# Patient Record
Sex: Female | Born: 1970
Health system: Southern US, Community
[De-identification: ages and names within clinical notes are randomized; demographics above are authoritative.]

## PROBLEM LIST (undated history)

## (undated) DIAGNOSIS — R0602 Shortness of breath: Secondary | ICD-10-CM

## (undated) DIAGNOSIS — R51 Headache: Secondary | ICD-10-CM

## (undated) DIAGNOSIS — R519 Headache, unspecified: Secondary | ICD-10-CM

## (undated) DIAGNOSIS — Z9989 Dependence on other enabling machines and devices: Secondary | ICD-10-CM

## (undated) DIAGNOSIS — E669 Obesity, unspecified: Secondary | ICD-10-CM

## (undated) DIAGNOSIS — G8929 Other chronic pain: Secondary | ICD-10-CM

## (undated) DIAGNOSIS — E039 Hypothyroidism, unspecified: Secondary | ICD-10-CM

## (undated) DIAGNOSIS — R7302 Impaired glucose tolerance (oral): Secondary | ICD-10-CM

## (undated) DIAGNOSIS — I10 Essential (primary) hypertension: Secondary | ICD-10-CM

## (undated) DIAGNOSIS — U071 COVID-19: Secondary | ICD-10-CM

## (undated) DIAGNOSIS — M545 Low back pain, unspecified: Secondary | ICD-10-CM

## (undated) DIAGNOSIS — D352 Benign neoplasm of pituitary gland: Secondary | ICD-10-CM

## (undated) DIAGNOSIS — E785 Hyperlipidemia, unspecified: Secondary | ICD-10-CM

## (undated) DIAGNOSIS — K219 Gastro-esophageal reflux disease without esophagitis: Secondary | ICD-10-CM

## (undated) DIAGNOSIS — T4145XA Adverse effect of unspecified anesthetic, initial encounter: Secondary | ICD-10-CM

## (undated) DIAGNOSIS — G4733 Obstructive sleep apnea (adult) (pediatric): Secondary | ICD-10-CM

## (undated) DIAGNOSIS — T8859XA Other complications of anesthesia, initial encounter: Secondary | ICD-10-CM

## (undated) HISTORY — DX: Hyperlipidemia, unspecified: E78.5

## (undated) HISTORY — DX: Impaired glucose tolerance (oral): R73.02

## (undated) HISTORY — DX: Benign neoplasm of pituitary gland: D35.2

## (undated) HISTORY — DX: Gastro-esophageal reflux disease without esophagitis: K21.9

## (undated) HISTORY — DX: Obesity, unspecified: E66.9

## (undated) HISTORY — PX: CHOLECYSTECTOMY: SHX55

## (undated) HISTORY — DX: Essential (primary) hypertension: I10

## (undated) HISTORY — PX: BACK SURGERY: SHX140

## (undated) HISTORY — DX: Hypothyroidism, unspecified: E03.9

---

## 1998-03-18 ENCOUNTER — Other Ambulatory Visit: Admission: RE | Admit: 1998-03-18 | Discharge: 1998-03-18 | Payer: Self-pay | Admitting: Obstetrics & Gynecology

## 1998-03-25 ENCOUNTER — Other Ambulatory Visit: Admission: RE | Admit: 1998-03-25 | Discharge: 1998-03-25 | Payer: Self-pay | Admitting: Obstetrics & Gynecology

## 1998-03-30 ENCOUNTER — Inpatient Hospital Stay (HOSPITAL_COMMUNITY): Admission: AD | Admit: 1998-03-30 | Discharge: 1998-03-30 | Payer: Self-pay | Admitting: Obstetrics and Gynecology

## 1998-05-17 ENCOUNTER — Other Ambulatory Visit: Admission: RE | Admit: 1998-05-17 | Discharge: 1998-05-17 | Payer: Self-pay | Admitting: Obstetrics & Gynecology

## 1999-12-12 HISTORY — PX: REDUCTION MAMMAPLASTY: SUR839

## 2000-03-20 ENCOUNTER — Other Ambulatory Visit: Admission: RE | Admit: 2000-03-20 | Discharge: 2000-03-20 | Payer: Self-pay | Admitting: Obstetrics

## 2000-04-02 ENCOUNTER — Ambulatory Visit (HOSPITAL_BASED_OUTPATIENT_CLINIC_OR_DEPARTMENT_OTHER): Admission: RE | Admit: 2000-04-02 | Discharge: 2000-04-02 | Payer: Self-pay | Admitting: Specialist

## 2000-04-02 ENCOUNTER — Encounter (INDEPENDENT_AMBULATORY_CARE_PROVIDER_SITE_OTHER): Payer: Self-pay | Admitting: *Deleted

## 2000-08-30 ENCOUNTER — Inpatient Hospital Stay (HOSPITAL_COMMUNITY): Admission: AD | Admit: 2000-08-30 | Discharge: 2000-08-30 | Payer: Self-pay | Admitting: Obstetrics & Gynecology

## 2000-09-14 ENCOUNTER — Other Ambulatory Visit: Admission: RE | Admit: 2000-09-14 | Discharge: 2000-09-14 | Payer: Self-pay | Admitting: Obstetrics and Gynecology

## 2000-10-03 ENCOUNTER — Inpatient Hospital Stay (HOSPITAL_COMMUNITY): Admission: AD | Admit: 2000-10-03 | Discharge: 2000-10-03 | Payer: Self-pay | Admitting: Obstetrics and Gynecology

## 2000-10-11 ENCOUNTER — Encounter: Payer: Self-pay | Admitting: Obstetrics and Gynecology

## 2000-10-11 ENCOUNTER — Ambulatory Visit (HOSPITAL_COMMUNITY): Admission: RE | Admit: 2000-10-11 | Discharge: 2000-10-11 | Payer: Self-pay | Admitting: Obstetrics and Gynecology

## 2000-10-12 ENCOUNTER — Inpatient Hospital Stay (HOSPITAL_COMMUNITY): Admission: AD | Admit: 2000-10-12 | Discharge: 2000-10-12 | Payer: Self-pay | Admitting: *Deleted

## 2000-10-23 ENCOUNTER — Inpatient Hospital Stay (HOSPITAL_COMMUNITY): Admission: AD | Admit: 2000-10-23 | Discharge: 2000-10-23 | Payer: Self-pay | Admitting: *Deleted

## 2000-11-05 ENCOUNTER — Inpatient Hospital Stay (HOSPITAL_COMMUNITY): Admission: AD | Admit: 2000-11-05 | Discharge: 2000-11-05 | Payer: Self-pay | Admitting: Obstetrics & Gynecology

## 2001-01-03 ENCOUNTER — Other Ambulatory Visit: Admission: RE | Admit: 2001-01-03 | Discharge: 2001-01-03 | Payer: Self-pay | Admitting: Obstetrics

## 2002-01-08 ENCOUNTER — Encounter: Payer: Self-pay | Admitting: Obstetrics and Gynecology

## 2002-01-08 ENCOUNTER — Ambulatory Visit (HOSPITAL_COMMUNITY): Admission: RE | Admit: 2002-01-08 | Discharge: 2002-01-08 | Payer: Self-pay | Admitting: Obstetrics and Gynecology

## 2003-10-30 ENCOUNTER — Other Ambulatory Visit: Admission: RE | Admit: 2003-10-30 | Discharge: 2003-10-30 | Payer: Self-pay | Admitting: Obstetrics and Gynecology

## 2004-03-08 ENCOUNTER — Ambulatory Visit (HOSPITAL_COMMUNITY): Admission: RE | Admit: 2004-03-08 | Discharge: 2004-03-08 | Payer: Self-pay | Admitting: Obstetrics and Gynecology

## 2006-12-26 ENCOUNTER — Encounter: Payer: Self-pay | Admitting: Endocrinology

## 2007-02-04 ENCOUNTER — Other Ambulatory Visit: Admission: RE | Admit: 2007-02-04 | Discharge: 2007-02-04 | Payer: Self-pay | Admitting: Gynecology

## 2007-02-26 ENCOUNTER — Ambulatory Visit (HOSPITAL_COMMUNITY): Admission: RE | Admit: 2007-02-26 | Discharge: 2007-02-26 | Payer: Self-pay | Admitting: Gynecology

## 2007-03-29 ENCOUNTER — Encounter: Admission: RE | Admit: 2007-03-29 | Discharge: 2007-03-29 | Payer: Self-pay | Admitting: Gynecology

## 2007-05-15 ENCOUNTER — Encounter: Payer: Self-pay | Admitting: Endocrinology

## 2007-07-08 ENCOUNTER — Emergency Department (HOSPITAL_COMMUNITY): Admission: EM | Admit: 2007-07-08 | Discharge: 2007-07-08 | Payer: Self-pay | Admitting: Emergency Medicine

## 2007-07-08 ENCOUNTER — Encounter: Payer: Self-pay | Admitting: Endocrinology

## 2007-07-15 ENCOUNTER — Encounter: Admission: RE | Admit: 2007-07-15 | Discharge: 2007-10-13 | Payer: Self-pay | Admitting: Family Medicine

## 2007-07-24 ENCOUNTER — Other Ambulatory Visit: Admission: RE | Admit: 2007-07-24 | Discharge: 2007-07-24 | Payer: Self-pay | Admitting: Gynecology

## 2007-10-31 ENCOUNTER — Encounter: Payer: Self-pay | Admitting: Gynecology

## 2007-10-31 ENCOUNTER — Encounter: Payer: Self-pay | Admitting: Endocrinology

## 2008-03-26 ENCOUNTER — Encounter: Payer: Self-pay | Admitting: Endocrinology

## 2008-04-16 ENCOUNTER — Other Ambulatory Visit: Admission: RE | Admit: 2008-04-16 | Discharge: 2008-04-16 | Payer: Self-pay | Admitting: Gynecology

## 2008-08-21 ENCOUNTER — Encounter: Payer: Self-pay | Admitting: Endocrinology

## 2008-08-21 ENCOUNTER — Encounter: Payer: Self-pay | Admitting: Pulmonary Disease

## 2008-08-26 ENCOUNTER — Encounter: Payer: Self-pay | Admitting: Endocrinology

## 2008-09-01 ENCOUNTER — Inpatient Hospital Stay (HOSPITAL_COMMUNITY): Admission: EM | Admit: 2008-09-01 | Discharge: 2008-09-05 | Payer: Self-pay | Admitting: Emergency Medicine

## 2008-09-04 ENCOUNTER — Encounter: Payer: Self-pay | Admitting: Internal Medicine

## 2008-09-09 ENCOUNTER — Encounter: Payer: Self-pay | Admitting: Endocrinology

## 2008-09-09 ENCOUNTER — Encounter: Payer: Self-pay | Admitting: Pulmonary Disease

## 2008-09-22 DIAGNOSIS — E669 Obesity, unspecified: Secondary | ICD-10-CM | POA: Insufficient documentation

## 2008-09-22 DIAGNOSIS — D352 Benign neoplasm of pituitary gland: Secondary | ICD-10-CM | POA: Insufficient documentation

## 2008-09-22 DIAGNOSIS — D353 Benign neoplasm of craniopharyngeal duct: Secondary | ICD-10-CM

## 2008-09-23 ENCOUNTER — Ambulatory Visit: Payer: Self-pay | Admitting: Pulmonary Disease

## 2008-09-23 DIAGNOSIS — R0602 Shortness of breath: Secondary | ICD-10-CM | POA: Insufficient documentation

## 2008-12-19 ENCOUNTER — Encounter: Admission: RE | Admit: 2008-12-19 | Discharge: 2008-12-19 | Payer: Self-pay | Admitting: Neurosurgery

## 2008-12-22 ENCOUNTER — Encounter: Admission: RE | Admit: 2008-12-22 | Discharge: 2008-12-22 | Payer: Self-pay | Admitting: Endocrinology

## 2008-12-24 ENCOUNTER — Encounter: Admission: RE | Admit: 2008-12-24 | Discharge: 2008-12-24 | Payer: Self-pay | Admitting: Neurosurgery

## 2008-12-28 ENCOUNTER — Ambulatory Visit: Payer: Self-pay | Admitting: Internal Medicine

## 2008-12-28 DIAGNOSIS — E039 Hypothyroidism, unspecified: Secondary | ICD-10-CM | POA: Insufficient documentation

## 2008-12-28 DIAGNOSIS — I10 Essential (primary) hypertension: Secondary | ICD-10-CM | POA: Insufficient documentation

## 2008-12-28 DIAGNOSIS — E739 Lactose intolerance, unspecified: Secondary | ICD-10-CM | POA: Insufficient documentation

## 2008-12-28 DIAGNOSIS — J019 Acute sinusitis, unspecified: Secondary | ICD-10-CM | POA: Insufficient documentation

## 2009-09-21 IMAGING — US US SOFT TISSUE HEAD/NECK
1 series · 13 of 25 positions shown · non-contrast
Comparison: None

Addendum Begins

The outside report from [HOSPITAL] dated 08/18/2008 is
now available for comparison.
The 4 mm upper pole right nodule is described similarly on the
prior exam.
The interpolar right sided solid nodule is reported at 1.0 x 0.6 x
0.9 cm on the prior exam.  This suggests stability.
Findings therefore suggests stability over approximately 3 months.
Ongoing ultrasound surveillance suggested.
Addendum Ends
CLINICAL DATA: Follow up of goiter.
THYROID ULTRASOUND
TECHNIQUE: Ultrasound examination of the thyroid gland and
adjacent soft tissues was performed.

[Series 1: us soft tissue head/neck · 0.08mm/px · 13 of 45 slices shown]
[im 1/45]
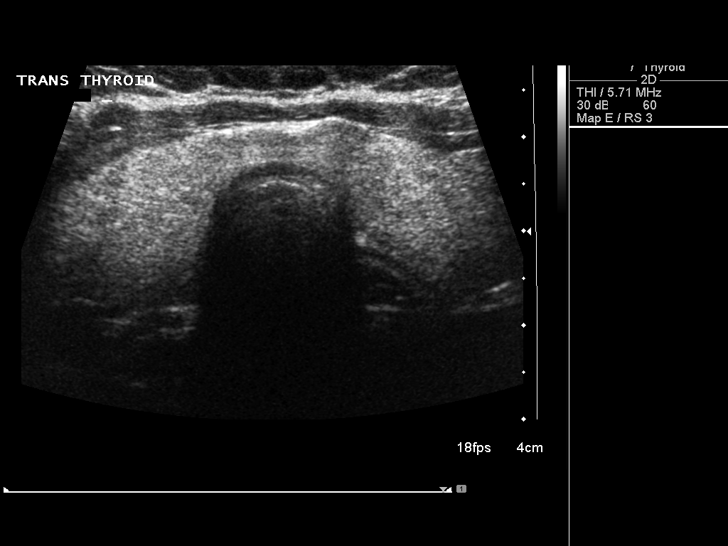
[im 4/45]
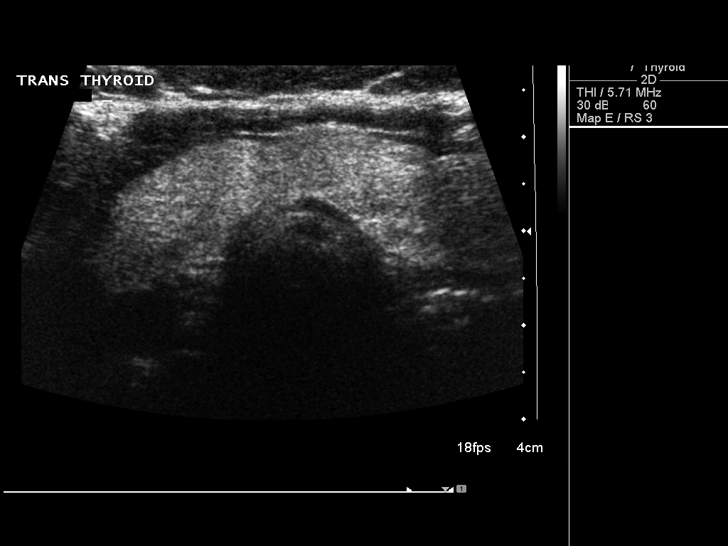
[im 8/45]
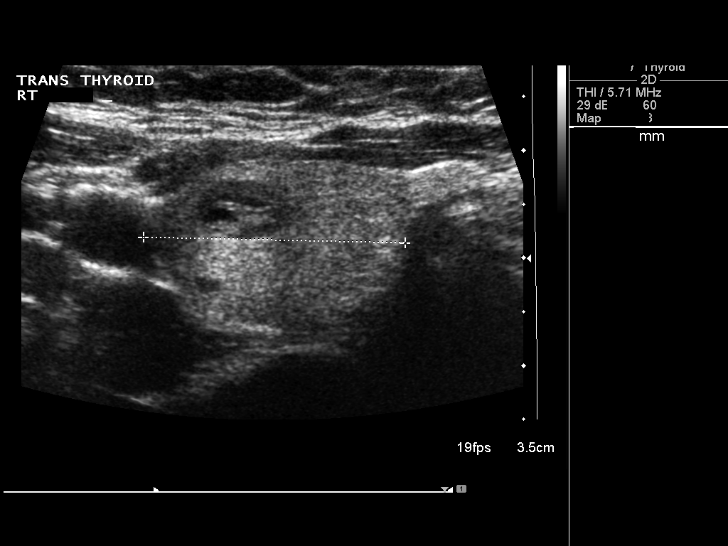
[im 12/45]
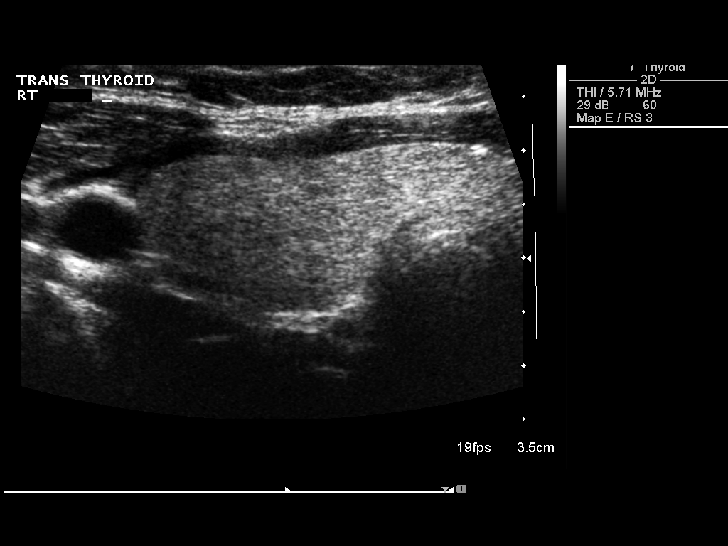
[im 15/45]
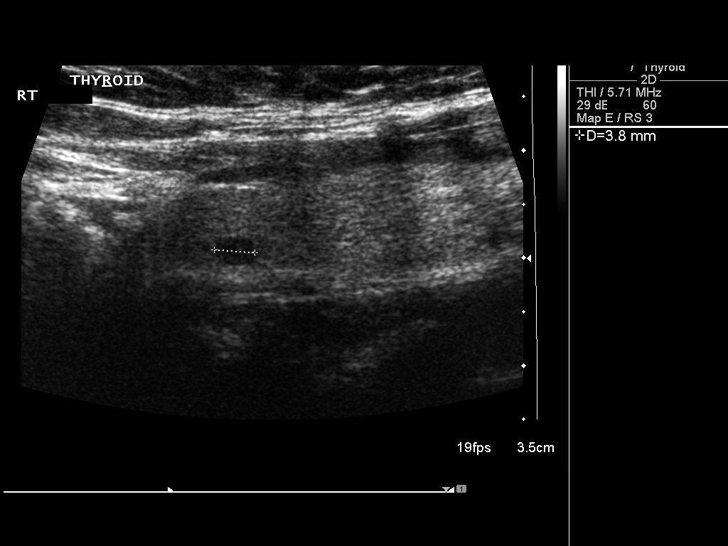
[im 19/45]
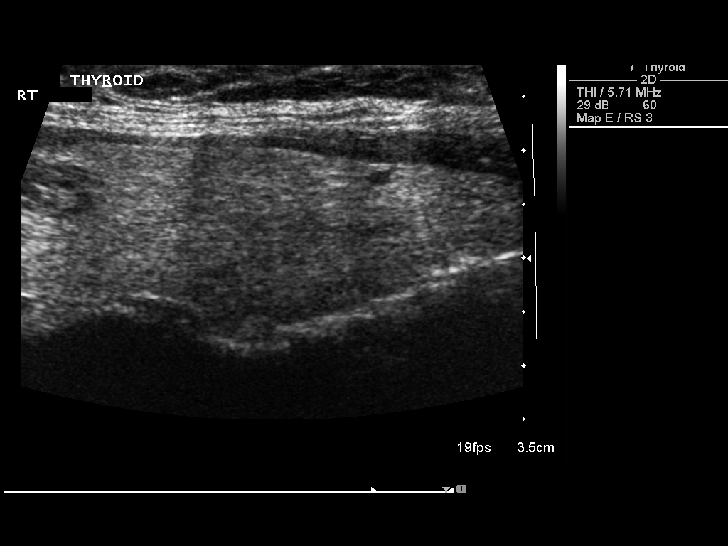
[im 23/45]
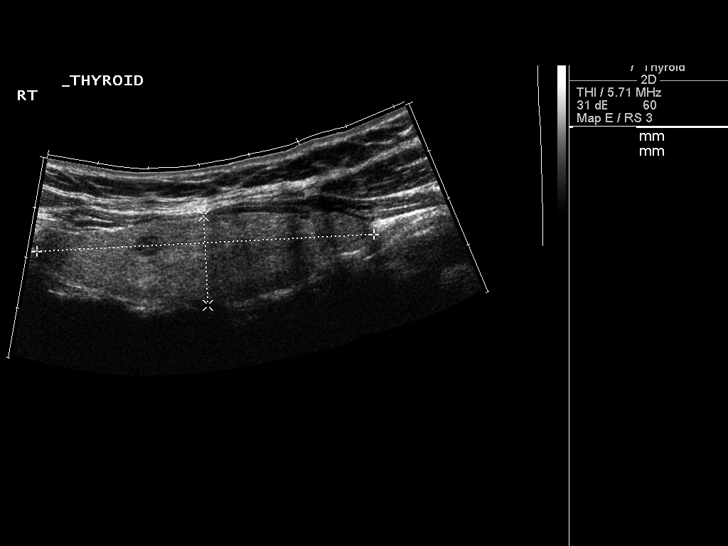
[im 26/45]
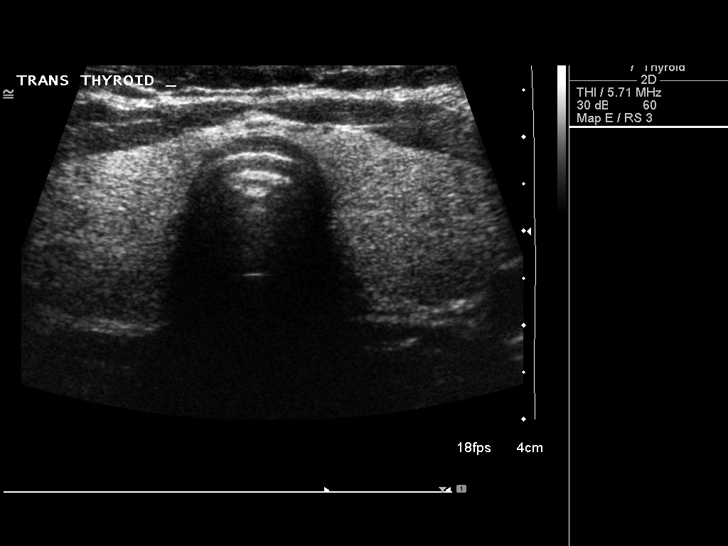
[im 30/45]
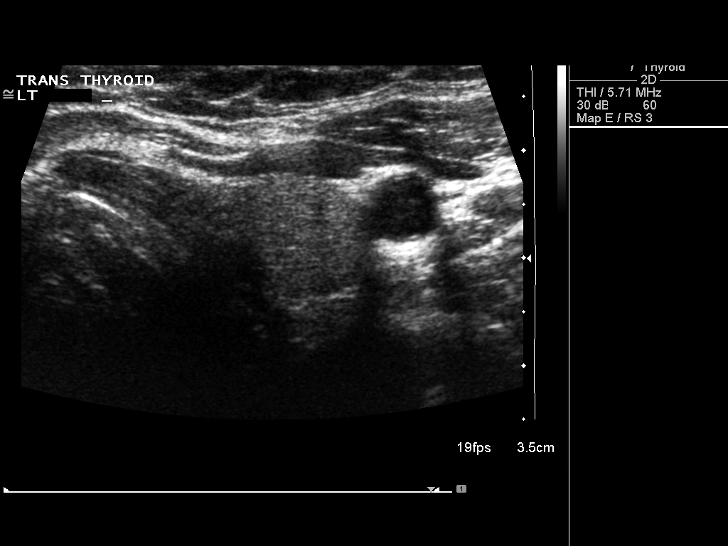
[im 34/45]
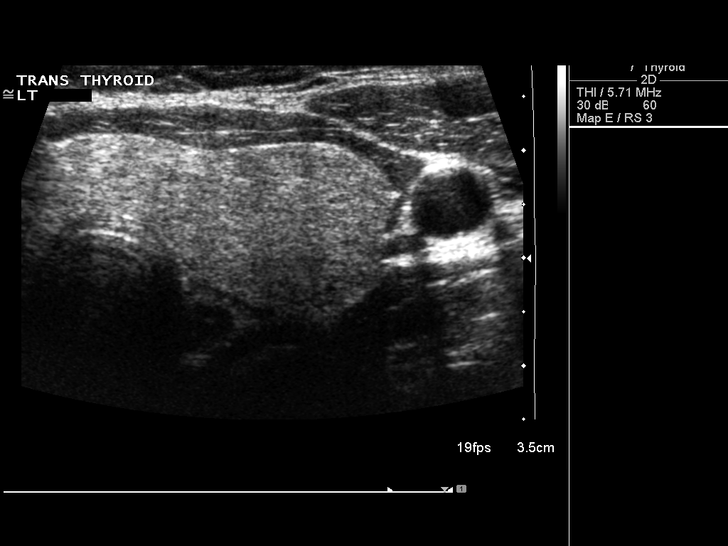
[im 37/45]
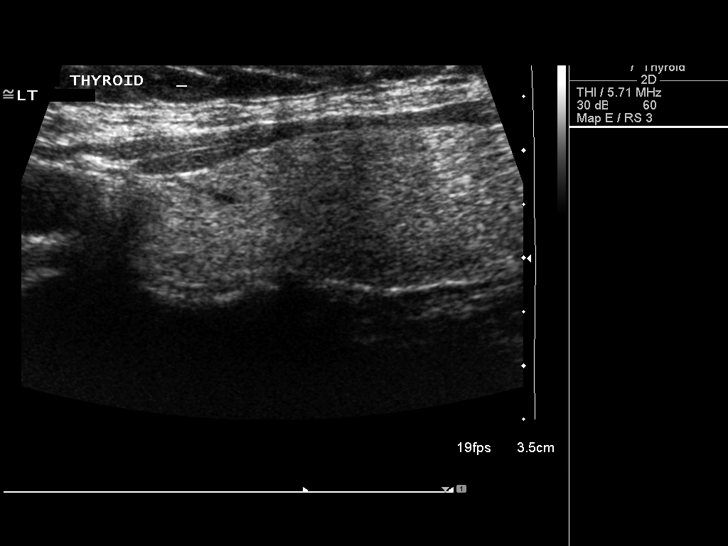
[im 41/45]
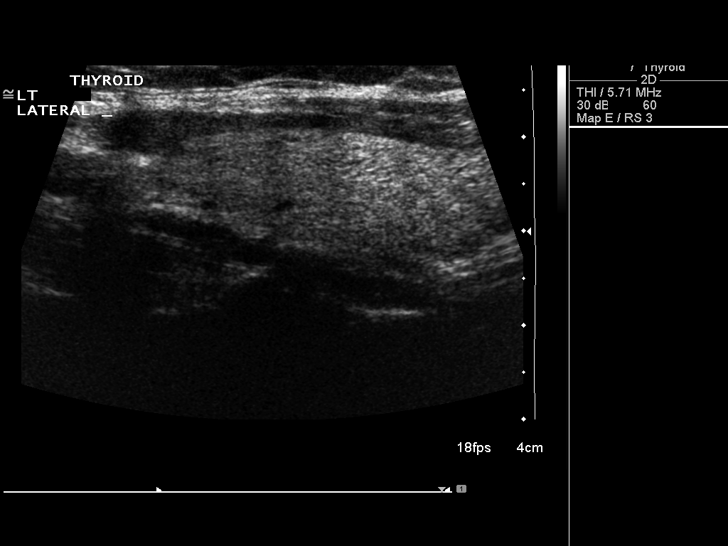
[im 45/45]
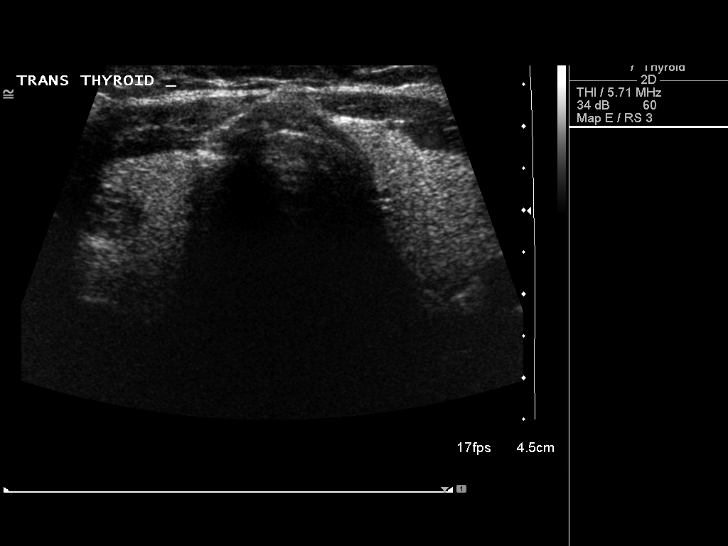

[13 of 25 positions shown; findings below may reference images not displayed]

FINDINGS: The right lobe measures 6.6 x 1.8 x 2.4 cm.  The left
lobe measures 5.6 x 1.4 x 2.2 cm.  The isthmus measures 6 mm .

Mildly heterogeneous thyroid echotexture.

Within the upper pole of the right lobe 4 mm hypoechoic solid
nodule.

Within the interpolar region of the right lobe, a 9 x 6 x 9 mm
hypoechoic solid lesion.

Within the medial lower pole of the right lobe, 2 mm hypoechoic,
likely solid lesion.

No left-sided lesions.
IMPRESSION: Right-sided thyroid lesions without highly suspicious dominant
lesion.  A 9 mm interpolar right sided lesion can be reevaluated at
follow-up.

## 2009-10-01 ENCOUNTER — Telehealth: Payer: Self-pay | Admitting: Internal Medicine

## 2009-10-01 ENCOUNTER — Ambulatory Visit: Payer: Self-pay | Admitting: Internal Medicine

## 2009-10-01 LAB — CONVERTED CEMR LAB
ALT: 26 units/L (ref 0–35)
AST: 19 units/L (ref 0–37)
Albumin: 3.4 g/dL — ABNORMAL LOW (ref 3.5–5.2)
Alkaline Phosphatase: 104 units/L (ref 39–117)
BUN: 10 mg/dL (ref 6–23)
Basophils Absolute: 0 10*3/uL (ref 0.0–0.1)
Basophils Relative: 0.5 % (ref 0.0–3.0)
Bilirubin Urine: NEGATIVE
Bilirubin, Direct: 0.1 mg/dL (ref 0.0–0.3)
CO2: 33 meq/L — ABNORMAL HIGH (ref 19–32)
Calcium: 8.9 mg/dL (ref 8.4–10.5)
Chloride: 100 meq/L (ref 96–112)
Cholesterol: 232 mg/dL — ABNORMAL HIGH (ref 0–200)
Creatinine, Ser: 0.7 mg/dL (ref 0.4–1.2)
Direct LDL: 176.6 mg/dL
Eosinophils Absolute: 0.2 10*3/uL (ref 0.0–0.7)
Eosinophils Relative: 2.1 % (ref 0.0–5.0)
GFR calc non Af Amer: 121.09 mL/min (ref >60)
Glucose, Bld: 87 mg/dL (ref 70–99)
HCT: 36.6 % (ref 36.0–46.0)
HDL: 52.1 mg/dL (ref >39.00)
Hemoglobin: 12.5 g/dL (ref 12.0–15.0)
Hgb A1c MFr Bld: 5.5 % (ref 4.6–6.5)
Ketones, ur: NEGATIVE mg/dL
Lymphocytes Relative: 29 % (ref 12.0–46.0)
Lymphs Abs: 2.6 10*3/uL (ref 0.7–4.0)
MCHC: 34.1 g/dL (ref 30.0–36.0)
MCV: 83.8 fL (ref 78.0–100.0)
Monocytes Absolute: 0.4 10*3/uL (ref 0.1–1.0)
Monocytes Relative: 4.8 % (ref 3.0–12.0)
Neutro Abs: 5.7 10*3/uL (ref 1.4–7.7)
Neutrophils Relative %: 63.6 % (ref 43.0–77.0)
Nitrite: NEGATIVE
Platelets: 314 10*3/uL (ref 150.0–400.0)
Potassium: 3.9 meq/L (ref 3.5–5.1)
RBC: 4.37 M/uL (ref 3.87–5.11)
RDW: 13.6 % (ref 11.5–14.6)
Sodium: 140 meq/L (ref 135–145)
Specific Gravity, Urine: 1.025 (ref 1.000–1.030)
TSH: 1.75 microintl units/mL (ref 0.35–5.50)
Total Bilirubin: 0.7 mg/dL (ref 0.3–1.2)
Total CHOL/HDL Ratio: 4
Total Protein, Urine: 30 mg/dL
Total Protein: 7.4 g/dL (ref 6.0–8.3)
Triglycerides: 67 mg/dL (ref 0.0–149.0)
Urine Glucose: NEGATIVE mg/dL
Urobilinogen, UA: 0.2 (ref 0.0–1.0)
VLDL: 13.4 mg/dL (ref 0.0–40.0)
WBC: 8.9 10*3/uL (ref 4.5–10.5)
pH: 6 (ref 5.0–8.0)

## 2009-10-04 LAB — CONVERTED CEMR LAB: Prolactin: 3.7 ng/mL

## 2009-10-08 ENCOUNTER — Ambulatory Visit: Payer: Self-pay | Admitting: Internal Medicine

## 2009-10-21 ENCOUNTER — Telehealth: Payer: Self-pay | Admitting: Internal Medicine

## 2010-02-15 ENCOUNTER — Ambulatory Visit: Payer: Self-pay | Admitting: Internal Medicine

## 2010-02-15 DIAGNOSIS — J069 Acute upper respiratory infection, unspecified: Secondary | ICD-10-CM | POA: Insufficient documentation

## 2010-02-15 DIAGNOSIS — R21 Rash and other nonspecific skin eruption: Secondary | ICD-10-CM | POA: Insufficient documentation

## 2010-02-23 ENCOUNTER — Encounter: Admission: RE | Admit: 2010-02-23 | Discharge: 2010-02-23 | Payer: Self-pay | Admitting: Neurosurgery

## 2010-02-27 DIAGNOSIS — F32A Depression, unspecified: Secondary | ICD-10-CM | POA: Insufficient documentation

## 2010-02-27 DIAGNOSIS — J309 Allergic rhinitis, unspecified: Secondary | ICD-10-CM | POA: Insufficient documentation

## 2010-02-27 DIAGNOSIS — F329 Major depressive disorder, single episode, unspecified: Secondary | ICD-10-CM

## 2010-02-28 ENCOUNTER — Encounter: Payer: Self-pay | Admitting: Internal Medicine

## 2010-04-08 ENCOUNTER — Encounter: Payer: Self-pay | Admitting: Internal Medicine

## 2010-04-12 ENCOUNTER — Encounter: Payer: Self-pay | Admitting: Internal Medicine

## 2010-04-12 ENCOUNTER — Telehealth: Payer: Self-pay | Admitting: Internal Medicine

## 2010-07-11 HISTORY — PX: DILATION AND CURETTAGE OF UTERUS: SHX78

## 2010-07-11 LAB — CONVERTED CEMR LAB: Pap Smear: NORMAL

## 2010-08-31 ENCOUNTER — Ambulatory Visit: Payer: Self-pay | Admitting: Internal Medicine

## 2010-08-31 DIAGNOSIS — H918X9 Other specified hearing loss, unspecified ear: Secondary | ICD-10-CM | POA: Insufficient documentation

## 2010-08-31 LAB — CONVERTED CEMR LAB
ALT: 27 units/L (ref 0–35)
AST: 26 units/L (ref 0–37)
Albumin: 3.5 g/dL (ref 3.5–5.2)
Alkaline Phosphatase: 125 units/L — ABNORMAL HIGH (ref 39–117)
BUN: 11 mg/dL (ref 6–23)
Basophils Absolute: 0 10*3/uL (ref 0.0–0.1)
Basophils Relative: 0.4 % (ref 0.0–3.0)
Bilirubin, Direct: 0.1 mg/dL (ref 0.0–0.3)
CO2: 31 meq/L (ref 19–32)
Calcium: 9.3 mg/dL (ref 8.4–10.5)
Chloride: 99 meq/L (ref 96–112)
Cholesterol: 225 mg/dL — ABNORMAL HIGH (ref 0–200)
Creatinine, Ser: 0.5 mg/dL (ref 0.4–1.2)
Direct LDL: 164.6 mg/dL
Eosinophils Absolute: 0.4 10*3/uL (ref 0.0–0.7)
Eosinophils Relative: 5.3 % — ABNORMAL HIGH (ref 0.0–5.0)
GFR calc non Af Amer: 169.8 mL/min (ref >60)
Glucose, Bld: 80 mg/dL (ref 70–99)
HCT: 36.1 % (ref 36.0–46.0)
HDL: 47.3 mg/dL (ref >39.00)
Hemoglobin: 12.1 g/dL (ref 12.0–15.0)
Lymphocytes Relative: 24.5 % (ref 12.0–46.0)
Lymphs Abs: 1.8 10*3/uL (ref 0.7–4.0)
MCHC: 33.5 g/dL (ref 30.0–36.0)
MCV: 83 fL (ref 78.0–100.0)
Monocytes Absolute: 0.6 10*3/uL (ref 0.1–1.0)
Monocytes Relative: 8.3 % (ref 3.0–12.0)
Neutro Abs: 4.6 10*3/uL (ref 1.4–7.7)
Neutrophils Relative %: 61.5 % (ref 43.0–77.0)
Platelets: 313 10*3/uL (ref 150.0–400.0)
Potassium: 3.5 meq/L (ref 3.5–5.1)
RBC: 4.34 M/uL (ref 3.87–5.11)
RDW: 13.9 % (ref 11.5–14.6)
Sodium: 139 meq/L (ref 135–145)
TSH: 1.36 microintl units/mL (ref 0.35–5.50)
Total Bilirubin: 0.3 mg/dL (ref 0.3–1.2)
Total CHOL/HDL Ratio: 5
Total Protein: 7.1 g/dL (ref 6.0–8.3)
Triglycerides: 131 mg/dL (ref 0.0–149.0)
VLDL: 26.2 mg/dL (ref 0.0–40.0)
WBC: 7.5 10*3/uL (ref 4.5–10.5)

## 2010-12-23 ENCOUNTER — Ambulatory Visit
Admission: RE | Admit: 2010-12-23 | Discharge: 2010-12-23 | Payer: Self-pay | Source: Home / Self Care | Attending: Endocrinology | Admitting: Endocrinology

## 2011-01-10 NOTE — Letter (Signed)
Summary: Out of Work  [facility]  [address] [place], [place] [address]   Phone: [phone]  Fax: [phone]    February 15, 2010   Employee:  Elby Showers    To Whom It May Concern:   For Medical reasons, please excuse the above named employee from work for the following dates:  Start:   mar 8. 2011  End:    If you need additional information, please feel free to contact our office.         Sincerely,    Corwin Levins MD

## 2011-01-10 NOTE — Assessment & Plan Note (Signed)
Summary: COLD /NWS #  CPX SCHEDULED ON 9-30   Vital Signs:  Patient profile:   40 year old female Height:      65 inches Weight:      311.25 pounds BMI:     51.98 O2 Sat:      99 % on Room air Temp:     96.6 degrees F oral Pulse rate:   85 / minute BP sitting:   120 / 80  (left arm) Cuff size:   large  Vitals Entered By: Zella Ball Ewing CMA Duncan Dull) (August 31, 2010 8:38 AM)  O2 Flow:  Room air  Preventive Care Screening  Pap Smear:    Date:  07/11/2010    Results:  normal      had baseline mammogram early 2011 per Dr Chevis Pretty  CC: Sore throat, congestion, cough, fever/RE/wellness    Primary Care Provider:  Gweneth Dimitri  CC:  Sore throat, congestion, cough, and fever/RE/wellness .  History of Present Illness: here for wellnes; also with acute onset mild to mod 3 days facial pain, pressure, fever and greenish d/c, with also hearing losson the right due to wax impaction.  No ST and Pt denies CP, worsening sob, doe, wheezing, orthopnea, pnd, worsening LE edema, palps, dizziness or syncope  Pt denies new neuro symptoms such as headache, facial or extremity weakness  Pt denies polydipsia, polyuria  Overall good compliance with meds, trying to follow low chol, DM diet, wt stable, little excercise however   Problems Prior to Update: 1)  Other Specified Forms of Hearing Loss  (ICD-389.8) 2)  Sinusitis- Acute-nos  (ICD-461.9) 3)  Depression  (ICD-311) 4)  Rash-nonvesicular  (ICD-782.1) 5)  Allergic Rhinitis  (ICD-477.9) 6)  Uri  (ICD-465.9) 7)  Preventive Health Care  (ICD-V70.0) 8)  Sinusitis- Acute-nos  (ICD-461.9) 9)  Sinusitis- Acute-nos  (ICD-461.9) 10)  Glucose Intolerance  (ICD-271.3) 11)  Hypertension  (ICD-401.9) 12)  Hypothyroidism  (ICD-244.9) 13)  Dyspnea  (ICD-786.05) 14)  Hx of Pituitary Adenoma  (ICD-227.3) 15)  Obesity  (ICD-278.00)  Medications Prior to Update: 1)  Prenatal Vitamins 0.8 Mg Tabs (Prenatal Multivit-Min-Fe-Fa) .Marland Kitchen.. 1 By Mouth Daily 2)   Cabergoline 0.5 Mg Tabs (Cabergoline) .... 2 Times Weekly 3)  Corzide 80-5 Mg Tabs (Nadolol-Bendroflumethiazide) .Marland Kitchen.. 1 Po  Two Times A Day 4)  Levothyroxine Sodium 25 Mcg Tabs (Levothyroxine Sodium) .Marland Kitchen.. 1 By Mouth Once Daily 5)  Klor-Con M20 20 Meq Cr-Tabs (Potassium Chloride Crys Cr) .... 2 By Mouth Q D 6)  Zolpidem Tartrate 10 Mg Tabs (Zolpidem Tartrate) .Marland Kitchen.. 1 Po At Bedtime As Needed 7)  Sertraline Hcl 100 Mg Tabs (Sertraline Hcl) .Marland Kitchen.. 1 By Mouth Once Daily 8)  Hydrocodone-Homatropine 5-1.5 Mg/37ml Syrp (Hydrocodone-Homatropine) .Marland Kitchen.. 1 Tsp By Mouth Q 6 Hrs As Needed Cough 9)  Fexofenadine Hcl 180 Mg Tabs (Fexofenadine Hcl) .Marland Kitchen.. 1 By Mouth Once Daily As Needed Allergies  Current Medications (verified): 1)  Prenatal Vitamins 0.8 Mg Tabs (Prenatal Multivit-Min-Fe-Fa) .Marland Kitchen.. 1 By Mouth Daily 2)  Cabergoline 0.5 Mg Tabs (Cabergoline) .... 2 Times Weekly 3)  Corzide 80-5 Mg Tabs (Nadolol-Bendroflumethiazide) .Marland Kitchen.. 1 Po  Two Times A Day 4)  Levothyroxine Sodium 25 Mcg Tabs (Levothyroxine Sodium) .Marland Kitchen.. 1 By Mouth Once Daily 5)  Klor-Con M20 20 Meq Cr-Tabs (Potassium Chloride Crys Cr) .... 2 By Mouth Once Daily 6)  Zolpidem Tartrate 10 Mg Tabs (Zolpidem Tartrate) .Marland Kitchen.. 1 Po At Bedtime As Needed 7)  Sertraline Hcl 100 Mg Tabs (Sertraline Hcl) .Marland Kitchen.. 1 By Mouth  Once Daily 8)  Hydrocodone-Homatropine 5-1.5 Mg/60ml Syrp (Hydrocodone-Homatropine) .Marland Kitchen.. 1 Tsp By Mouth Q 6 Hrs As Needed Cough 9)  Fexofenadine Hcl 180 Mg Tabs (Fexofenadine Hcl) .Marland Kitchen.. 1 By Mouth Once Daily As Needed Allergies 10)  Azithromycin 250 Mg Tabs (Azithromycin) .... 2po Qd For 1 Day, Then 1po Qd For 4days, Then Stop  Allergies (verified): No Known Drug Allergies  Past History:  Family History: Last updated: 2009-01-07 Father - Car accident - died at approx 13 yo - healthy o/w Mother - HTN Brother - HTN Sister - HTN  Social History: Last updated: 01/07/2009 Married.  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 Pituitary adenoma Obesity Hypothyroidism - dr Lurene Shadow Hypertension glucose intolerance Allergic rhinitis Depression  Past Surgical History: Breast reduction 2001 Tubal ligation 2006 D & C adn hysteroscopy aug 2011 - neg with Dr Chevis Pretty  Review of Systems  The patient denies anorexia, weight loss, weight gain, vision loss, decreased hearing, hoarseness, chest pain, syncope, dyspnea on exertion, peripheral edema, prolonged cough, headaches, hemoptysis, abdominal pain, melena, hematochezia, severe indigestion/heartburn, hematuria, muscle weakness, suspicious skin lesions, transient blindness, difficulty walking, depression, unusual weight change, abnormal bleeding, enlarged lymph nodes, and angioedema.         all otherwise negative per pt -  except for hearing loss to the right ear due to wax  Physical Exam  General:  alert and overweight-appearing.  , mild ill  Head:  normocephalic and atraumatic.   Eyes:  vision grossly intact, pupils equal, and pupils round.   Ears:  bilat tm's mild red, sinus tender bilat Nose:  nasal dischargemucosal pallor and mucosal edema.   Mouth:  pharyngeal erythema and fair dentition.   Neck:  supple and cervical lymphadenopathy.   Lungs:  normal respiratory effort and normal breath sounds.   Heart:  normal rate and regular rhythm.   Abdomen:  soft, non-tender, and normal bowel sounds.   Msk:  no joint tenderness and no joint swelling.   Extremities:  no edema, no erythema  Neurologic:  alert & oriented X3, cranial nerves II-XII intact, and strength normal in all extremities.   Skin:  color normal and no rashes.   Psych:  not anxious appearing and not depressed appearing.     Impression & Recommendations:  Problem #  1:  Preventive Health Care (ICD-V70.0)  Overall doing well, age appropriate education and counseling updated and referral for appropriate preventive services done unless declined, immunizations up to date or declined, diet counseling done if overweight, urged to quit smoking if smokes , most recent labs reviewed and current ordered if appropriate, ecg reviewed or declined (interpretation per ECG scanned in the EMR if done); information regarding Medicare Prevention requirements given if appropriate; speciality referrals updated as appropriate   Orders: TLB-Udip ONLY (81003-UDIP) TLB-BMP (Basic Metabolic Panel-BMET) (80048-METABOL) TLB-CBC Platelet - w/Differential (85025-CBCD) TLB-Hepatic/Liver Function Pnl (80076-HEPATIC) TLB-Lipid Panel (80061-LIPID) TLB-TSH (Thyroid Stimulating Hormone) (84443-TSH)  Problem # 2:  SINUSITIS- ACUTE-NOS (ICD-461.9)  Her updated medication list for this problem includes:    Hydrocodone-homatropine 5-1.5 Mg/97ml Syrp (Hydrocodone-homatropine) .Marland Kitchen... 1 tsp by mouth q 6 hrs as needed cough    Azithromycin 250 Mg Tabs (Azithromycin) .Marland Kitchen... 2po qd for 1 day, then 1po qd for 4days, then stop with significant cough - for antibx, cough  med, mucinex as needed   Problem # 3:  OTHER SPECIFIED FORMS OF HEARING LOSS (ICD-389.8) due to wax - resolved with irrigation today  Complete Medication List: 1)  Prenatal Vitamins 0.8 Mg Tabs (Prenatal multivit-min-fe-fa) .Marland Kitchen.. 1 by mouth daily 2)  Cabergoline 0.5 Mg Tabs (Cabergoline) .... 2 times weekly 3)  Corzide 80-5 Mg Tabs (Nadolol-bendroflumethiazide) .Marland Kitchen.. 1 po  two times a day 4)  Levothyroxine Sodium 25 Mcg Tabs (Levothyroxine sodium) .Marland Kitchen.. 1 by mouth once daily 5)  Klor-con M20 20 Meq Cr-tabs (Potassium chloride crys cr) .... 2 by mouth once daily 6)  Zolpidem Tartrate 10 Mg Tabs (Zolpidem tartrate) .Marland Kitchen.. 1 po at bedtime as needed 7)  Sertraline Hcl 100 Mg Tabs (Sertraline hcl) .Marland Kitchen.. 1 by mouth once daily 8)   Hydrocodone-homatropine 5-1.5 Mg/85ml Syrp (Hydrocodone-homatropine) .Marland Kitchen.. 1 tsp by mouth q 6 hrs as needed cough 9)  Fexofenadine Hcl 180 Mg Tabs (Fexofenadine hcl) .Marland Kitchen.. 1 by mouth once daily as needed allergies 10)  Azithromycin 250 Mg Tabs (Azithromycin) .... 2po qd for 1 day, then 1po qd for 4days, then stop  Other Orders: TLB-A1C / Hgb A1C (Glycohemoglobin) (83036-A1C)  Patient Instructions: 1)  Please take all new medications as prescribed 2)  Continue all previous medications as before this visit  3)  Please go to the Lab in the basement for your blood and/or urine tests today  4)  Please call the number on the [facility] Card for results of your testing  5)  All of your medications were sent to the pharmacy except for the few that you are given at the office 6)  you are given the work note today 7)  you had the flu shot today 8)  Please schedule a follow-up appointment in 1 year or sooner if needed  (OK to cancel the appt next wk - I will have the schedulers do this for you) Prescriptions: AZITHROMYCIN 250 MG TABS (AZITHROMYCIN) 2po qd for 1 day, then 1po qd for 4days, then stop  #6 x 1   Entered and Authorized by:   Corwin Levins MD   Signed by:   Corwin Levins MD on 08/31/2010   Method used:   Electronically to        [facility] [phone]* (retail)       [address]       [place], [place]  [address]       Ph: [phone]       Fax: [phone]   RxID:   [id number] AZITHROMYCIN 250 MG TABS (AZITHROMYCIN) 2po qd for 1 day, then 1po qd for 4days, then stop  #6 x 1   Entered and Authorized by:   Corwin Levins MD   Signed by:   Corwin Levins MD on 08/31/2010   Method used:   Print then Give to Patient   RxID:   [id number] HYDROCODONE-HOMATROPINE 5-1.5 MG/5ML SYRP (HYDROCODONE-HOMATROPINE) 1 tsp by mouth q 6 hrs as needed cough  #6 oz x 1   Entered and Authorized by:   Corwin Levins MD   Signed by:   Corwin Levins MD on 08/31/2010   Method used:    Print then Give to Patient   RxID:   [id number] ZOLPIDEM TARTRATE 10 MG TABS (ZOLPIDEM TARTRATE) 1 po at bedtime as needed  #90 x 1   Entered and Authorized by:   Corwin Levins MD   Signed by:  Corwin Levins MD on 08/31/2010   Method used:   Print then Give to Patient   RxID:   [id number] CORZIDE 80-5 MG TABS (NADOLOL-BENDROFLUMETHIAZIDE) 1 po  two times a day  #180 x 3   Entered and Authorized by:   Corwin Levins MD   Signed by:   Corwin Levins MD on 08/31/2010   Method used:   Electronically to        [facility] [phone]* (retail)       [address]       [place], [place]  [address]       Ph: [phone]       Fax: [phone]   RxID:   [id number] LEVOTHYROXINE SODIUM 25 MCG TABS (LEVOTHYROXINE SODIUM) 1 by mouth once daily  #90 x 3   Entered and Authorized by:   Corwin Levins MD   Signed by:   Corwin Levins MD on 08/31/2010   Method used:   Electronically to        [facility] [phone]* (retail)       [address]       [place], [place]  [address]       Ph: [phone]       Fax: [phone]   RxID:   [id number] SERTRALINE HCL 100 MG TABS (SERTRALINE HCL) 1 by mouth once daily  #90 x 3   Entered and Authorized by:   Corwin Levins MD   Signed by:   Corwin Levins MD on 08/31/2010   Method used:   Electronically to        [facility] [phone]* (retail)       [address]       [place], [place]  [address]       Ph: [phone]       Fax: [phone]   RxID:   [id number] FEXOFENADINE HCL 180 MG TABS (FEXOFENADINE HCL) 1 by mouth once daily as needed allergies  #90 x 3   Entered and Authorized by:   Corwin Levins MD   Signed by:   Corwin Levins MD on 08/31/2010   Method used:   Electronically to        [facility] [phone]* (retail)       [address]       [place], [place]  [address]       Ph: [phone]       Fax:  [phone]   RxID:   [id number] KLOR-CON M20 20 MEQ CR-TABS (POTASSIUM CHLORIDE CRYS CR) 2 by mouth once daily  #180 x 3   Entered and Authorized by:   Corwin Levins MD   Signed by:   Corwin Levins MD on 08/31/2010   Method used:   Electronically to        [facility] [phone]* (retail)       [address]       [place], [place]  [address]       Ph: [phone]       Fax: [phone]   RxID:   [id number]

## 2011-01-10 NOTE — Medication Information (Signed)
Summary: [facility]enadine/[facility]  [facility]enadine/[facility]   Imported By: Sherian Rein 04/15/2010 10:50:12  _____________________________________________________________________  External Attachment:    Type:   Image     Comment:   External Document

## 2011-01-10 NOTE — Medication Information (Signed)
Summary: [facility] for Fexofenadine[facility]  [facility] for [facility]   Imported By: Sherian Rein 04/13/2010 12:23:30  _____________________________________________________________________  External Attachment:    Type:   Image     Comment:   External Document

## 2011-01-10 NOTE — Assessment & Plan Note (Signed)
Summary: CHRONIC SOB/CB   PCP:  Gweneth Dimitri  Chief Complaint:  Pulmonary consult for SOB.Marland Kitchen  History of Present Illness: I met Chelsea Thompson today for evaluation of her dyspnea.  This started in September around labor day.  She developed a cold which developed into pneumonia, and she was told she had the flu.  Her daughter also was treated for the flu.  She has been having dyspnea since then.  She used a nebulizer, and this helped.  She has been using proventil 3 to 4 times per day, and this seems to help.  She has a cough with clear sputum.  She does get chest tightness and wheezing.  She says she feels like she is breathing through a straw.  She denies hemoptysis.  She is no longer having a fever.  She does get sinus congestion and a runny nose with post-nasal drip.  She has been having a sore throat and some hoarseness.  She has been getting nauseous.  She has only been able to eat saltine crackers, Coke, and pork bacon.  She denies travel history.  She works as a Engineer, site.  There is no history of tobacco exposure.  She denies chest pain, skin rashes, or leg swelling.  Chest xray from September 03, 2008 showed bibasilar atelectasis.  Chest xray today was unremarkable.  SPIROMETRY RESULTS  Date:09/23/2008 height inches: 64 Gender: Female  Parameter  Measured Predicted %Predicted  FVC      3.14        3.62      87  FEV1      2.66        3.07      87  FEV1/FVC      0.85        0.85      100  FEF25-75      3.65        3.53      107  PEF      6.33        432      99  Post-Bronchodilator  Parameter Measured Change from Baseline  FVC               FEV1               FEV1/FVC                FEF25-75                PEF               Pulse Oximetry Pulse: 112 O2 Saturation %: 99  Comments: Normal spirometry      Prior Medications Reviewed Using: Patient Recall  Updated Prior Medication List: PRENATAL VITAMINS 0.8 MG TABS (PRENATAL MULTIVIT-MIN-FE-FA) 1 by mouth  daily CABERGOLINE 0.5 MG TABS (CABERGOLINE) 2 times weekly CORZIDE 80-5 MG TABS (NADOLOL-BENDROFLUMETHIAZIDE) two times a day    Past Medical History:    DM    Pituitary adenoma    Obesity  Past Surgical History:    Breast reduction 2001    Tubal ligation 2006    D & C   Family History:    Father - Set designer accident    Mother - HTN    Brother - HTN    Sister - HTN    Daughter - no health problems  Social History:    Married.  Works as fourth Merchant navy officer.   Risk Factors:  Tobacco use:  never  Vital Signs:  Patient Profile:   40 Years Old Female Height:     64 inches Weight:      297.2 pounds O2 Sat:      99 % O2 treatment:    Room Air Temp:     98.4 degrees F oral Pulse rate:   112 / minute BP sitting:   120 / 80  (left arm) Cuff size:   large  Vitals Entered By: Michel Bickers CMA (September 23, 2008 4:12 PM)                 Physical Exam  General:     normal appearance and obese.   Eyes:     PERRLA and EOMI.   Ears:     TMs intact and clear with normal canals Nose:     clear nasal discharge.   Mouth:     throat injected.   Neck:     no masses, thyromegaly, or abnormal cervical nodes Lungs:     decreased BS bilateral and prolonged exhilation, faint crackles at bases  Heart:     regular rhythm, normal rate, and no murmurs.   Abdomen:     soft, obese, nontender, no masses, normal bowel sounds Extremities:     no clubbing, cyanosis, edema, or deformity noted Neurologic:     normal strength, no focal deficits    Pulmonary Function Test Height (in.): 64 Gender: Female  Pre-Spirometry FVC    Value: 3.14 L/min   Pred: 3.62 L/min     % Pred: 87 % FEV1    Value: 2.66 L     Pred: 3.07 L     % Pred: 87 % FEV1/FVC  Value: 0.85 %     Pred: 0.85 %    FEF 25-75  Value: 3.65 L/min   Pred: 3.53 L/min       Impression & Recommendations:  Problem # 1:  DYSPNEA (ICD-786.05) She had an acute febrile upper respiratory tract infection in September,  2009 which was likely mediated by virus.  I am concerned that she may have developed reactive airways disease as a post-viral syndrome.  I will give her a trial of symbicort with as needed proventil.  She also still has significant sinus symptoms, and I believe treating these symptoms should improve her lower respiratory symptoms as well.  To this end I will start her on nasal irrigation and nasonex.  She is also to use claritin as needed, and salt water gargles.  Medications Added to Medication List This Visit: 1)  Cabergoline 0.5 Mg Tabs (Cabergoline) .... 2 times weekly 2)  Corzide 80-5 Mg Tabs (Nadolol-bendroflumethiazide) .... Two times a day 3)  Symbicort 160-4.5 Mcg/act Aero (Budesonide-formoterol fumarate) .... Two puffs two times a day 4)  Nasonex 50 Mcg/act Susp (Mometasone furoate) .... Two sprays once daily 5)  Claritin 10 Mg Caps (Loratadine) .... One by mouth once daily as needed 6)  Proventil Hfa 108 (90 Base) Mcg/act Aers (Albuterol sulfate) .... Two puffs qid as needed  Complete Medication List: 1)  Prenatal Vitamins 0.8 Mg Tabs (Prenatal multivit-min-fe-fa) .Marland Kitchen.. 1 by mouth daily 2)  Cabergoline 0.5 Mg Tabs (Cabergoline) .... 2 times weekly 3)  Corzide 80-5 Mg Tabs (Nadolol-bendroflumethiazide) .... Two times a day 4)  Symbicort 160-4.5 Mcg/act Aero (Budesonide-formoterol fumarate) .... Two puffs two times a day 5)  Nasonex 50 Mcg/act Susp (Mometasone furoate) .... Two sprays once daily 6)  Claritin 10 Mg Caps (Loratadine) .... One by mouth once daily as  needed 7)  Proventil Hfa 108 (90 Base) Mcg/act Aers (Albuterol sulfate) .... Two puffs qid as needed   Patient Instructions: 1)  Nasal irrigation once daily  2)  Nasonex two sprays once daily  3)  Symbicort two puffs two times a day, and rinse your mouth after using 4)  Proventil two puffs upto four times per day as needed  5)  Claritin 10 mg once daily for 5 days, then as needed 6)  Salt water gargles once daily as needed   7)  Follow up in two to three weeks   Prescriptions: NASONEX 50 MCG/ACT SUSP (MOMETASONE FUROATE) two sprays once daily  #1 x 2   Entered and Authorized by:   Coralyn Helling MD   Signed by:   Coralyn Helling MD on 09/23/2008   Method used:   Print then Give to Patient   RxID:   [id number] SYMBICORT 160-4.5 MCG/ACT AERO (BUDESONIDE-FORMOTEROL FUMARATE) two puffs two times a day  #1 x 2   Entered and Authorized by:   Coralyn Helling MD   Signed by:   Coralyn Helling MD on 09/23/2008   Method used:   Print then Give to Patient   RxID:   [id number]  ]

## 2011-01-10 NOTE — Letter (Signed)
Summary: Out of Work  [facility]  [address] [place], [place] [address]   Phone: [phone]  Fax: [phone]    August 31, 2010   Employee:  Chelsea Thompson    To Whom It May Concern:   For Medical reasons, please excuse the above named employee from work for the following dates:  Start:   sept 21, 2011  End:   sept 21, 2011  If you need additional information, please feel free to contact our office.         Sincerely,    Corwin Levins MD

## 2011-01-10 NOTE — Letter (Signed)
Summary: [facility]  [facility]   Imported By: Sherian Rein 03/15/2010 13:48:49  _____________________________________________________________________  External Attachment:    Type:   Image     Comment:   External Document

## 2011-01-10 NOTE — Progress Notes (Signed)
Summary: Fexofenadine pa  Phone Note From Pharmacy   Details of Request: Case # [id number] Summary of Call: PA request--Fexofenadine. Forms were completed and faxed to [facility], will await insurance company reply. Initial call taken by: Lucious Groves,  Apr 12, 2010 9:35 AM     Appended Document: Fexofenadine pa approved until 04/2011.

## 2011-01-10 NOTE — Assessment & Plan Note (Signed)
Summary: HEAD ACHES ELEVATED BP: YESTERDY 144/-REFUS ANOTHER MD D/T-STC   Vital Signs:  Patient profile:   40 year old female Height:      65 inches Weight:      300 pounds BMI:     50.10 O2 Sat:      97 % on Room air Temp:     98 degrees F oral Pulse rate:   78 / minute BP sitting:   118 / 80  (left arm) Cuff size:   large  Vitals Entered ByZella Ball Ewing (February 15, 2010 9:20 AM)  O2 Flow:  Room air CC: headaches, elevated BP, rash, refills/RE   Primary Care Provider:  Gweneth Dimitri  CC:  headaches, elevated BP, rash, and refills/RE.  History of Present Illness: here with 4 wks intermittent headaches,  has had elevated BP in the past - BP at work per nurse has BP 140's iwth the pain, but is having pain today and BP good here.  Has some mild nasal congestion without sinus pain but has had nasal allergy type congestion for several wks  and incidently with cold like congeistion and headaches and non prod cough just in the past couple of days.  Has unuaul fatigue just in the past few weeks.  No usual menses, no overly heavy or prolonged, but is having worsening depressive symptoms over this time with low mood and energy, fatigue, sleeps more, gain wt, anhedonia but no suicidal ideation, panic or severe anxiety.  Has some mild rash to the glabellar region but seemed better today, without pain, swelling or tenderness.    Problems Prior to Update: 1)  Preventive Health Care  (ICD-V70.0) 2)  Sinusitis- Acute-nos  (ICD-461.9) 3)  Sinusitis- Acute-nos  (ICD-461.9) 4)  Glucose Intolerance  (ICD-271.3) 5)  Hypertension  (ICD-401.9) 6)  Hypothyroidism  (ICD-244.9) 7)  Dyspnea  (ICD-786.05) 8)  Hx of Pituitary Adenoma  (ICD-227.3) 9)  Obesity  (ICD-278.00)  Medications Prior to Update: 1)  Prenatal Vitamins 0.8 Mg Tabs (Prenatal Multivit-Min-Fe-Fa) .Marland Kitchen.. 1 By Mouth Daily 2)  Cabergoline 0.5 Mg Tabs (Cabergoline) .... 2 Times Weekly 3)  Corzide 80-5 Mg Tabs (Nadolol-Bendroflumethiazide)  .Marland Kitchen.. 1 Po  Two Times A Day 4)  Levothyroxine Sodium 25 Mcg Tabs (Levothyroxine Sodium) .Marland Kitchen.. 1 By Mouth Q D 5)  Klor-Con M20 20 Meq Cr-Tabs (Potassium Chloride Crys Cr) .... 2 By Mouth Q D 6)  Azithromycin 250 Mg Tabs (Azithromycin) .... 2po Qd For 1 Day, Then 1po Qd For 4days, Then Stop 7)  Zolpidem Tartrate 10 Mg Tabs (Zolpidem Tartrate) .Marland Kitchen.. 1 Po At Bedtime As Needed  Current Medications (verified): 1)  Prenatal Vitamins 0.8 Mg Tabs (Prenatal Multivit-Min-Fe-Fa) .Marland Kitchen.. 1 By Mouth Daily 2)  Cabergoline 0.5 Mg Tabs (Cabergoline) .... 2 Times Weekly 3)  Corzide 80-5 Mg Tabs (Nadolol-Bendroflumethiazide) .Marland Kitchen.. 1 Po  Two Times A Day 4)  Levothyroxine Sodium 25 Mcg Tabs (Levothyroxine Sodium) .Marland Kitchen.. 1 By Mouth Once Daily 5)  Klor-Con M20 20 Meq Cr-Tabs (Potassium Chloride Crys Cr) .... 2 By Mouth Q D 6)  Zolpidem Tartrate 10 Mg Tabs (Zolpidem Tartrate) .Marland Kitchen.. 1 Po At Bedtime As Needed 7)  Sertraline Hcl 100 Mg Tabs (Sertraline Hcl) .Marland Kitchen.. 1 By Mouth Once Daily 8)  Hydrocodone-Homatropine 5-1.5 Mg/40ml Syrp (Hydrocodone-Homatropine) .Marland Kitchen.. 1 Tsp By Mouth Q 6 Hrs As Needed Cough 9)  Fexofenadine Hcl 180 Mg Tabs (Fexofenadine Hcl) .Marland Kitchen.. 1 By Mouth Once Daily As Needed Allergies  Allergies (verified): No Known Drug Allergies  Past History:  Past Surgical History: Last updated: 09/23/2008 Breast reduction 2001 Tubal ligation 2006 D & C  Social History: Last updated: 12/28/2008 Married.  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   Head:  normocephalic and atraumatic.   Eyes:   vision grossly intact, pupils equal, and pupils round.   Ears:  bilat tm's mild erythema, sinus nontender Nose:  nasal dischargemucosal pallor and mucosal edema.   Mouth:  pharyngeal erythema and fair dentition.   Neck:  supple and no masses.   Lungs:  normal respiratory effort and normal breath sounds.   Heart:  normal rate and regular rhythm.   Msk:  no joint tenderness and no joint swelling.   Extremities:  no edema, no erythema  Neurologic:  alert & oriented X3, cranial nerves II-XII intact, and strength normal in all extremities.   Skin:  color normal.  except for mild erythema to the glabellar region  Psych:  depressed affect and moderately anxious.     Impression & Recommendations:  Problem # 1:  URI (ICD-465.9)  Her updated medication list for this problem includes:    Hydrocodone-homatropine 5-1.5 Mg/2ml Syrp (Hydrocodone-homatropine) .Marland Kitchen... 1 tsp by mouth q 6 hrs as needed cough    Fexofenadine Hcl 180 Mg Tabs (Fexofenadine hcl) .Marland Kitchen... 1 by mouth once daily as needed allergies mild, prob viral, ok for cough med as needed   Problem # 2:  ALLERGIC RHINITIS (ICD-477.9)  Her updated medication list for this problem includes:    Fexofenadine Hcl 180 Mg Tabs (Fexofenadine hcl) .Marland Kitchen... 1 by mouth once daily as needed allergies treat as above, f/u any worsening signs or symptoms   Problem # 3:  RASH-NONVESICULAR (ICD-782.1) mild, ok to follow for now, ? related to sinus involvement URI?, ok to follow for now  Problem # 4:  DEPRESSION (ICD-311)  Her updated medication list for this problem includes:    Sertraline Hcl 100 Mg Tabs (Sertraline hcl) .Marland Kitchen... 1 by mouth once daily treat as above, f/u any worsening signs or symptoms , declines counseling or psychiatry referral  Complete Medication List: 1)  Prenatal Vitamins 0.8 Mg Tabs (Prenatal multivit-min-fe-fa) .Marland Kitchen.. 1 by mouth daily 2)  Cabergoline 0.5 Mg Tabs (Cabergoline) .... 2 times weekly 3)  Corzide 80-5 Mg Tabs  (Nadolol-bendroflumethiazide) .Marland Kitchen.. 1 po  two times a day 4)  Levothyroxine Sodium 25 Mcg Tabs (Levothyroxine sodium) .Marland Kitchen.. 1 by mouth once daily 5)  Klor-con M20 20 Meq Cr-tabs (Potassium chloride crys cr) .... 2 by mouth q d 6)  Zolpidem Tartrate 10 Mg Tabs (Zolpidem tartrate) .Marland Kitchen.. 1 po at bedtime as needed 7)  Sertraline Hcl 100 Mg Tabs (Sertraline hcl) .Marland Kitchen.. 1 by mouth once daily 8)  Hydrocodone-homatropine 5-1.5 Mg/28ml Syrp (Hydrocodone-homatropine) .Marland Kitchen.. 1 tsp by mouth q 6 hrs as needed cough 9)  Fexofenadine Hcl 180 Mg Tabs (Fexofenadine hcl) .Marland Kitchen.. 1 by mouth once daily as needed allergies  Patient Instructions: 1)  Please take all new medications as prescribed  2)  Start the generic sertraline (zoloft) at HALF pill for 3 days, then whole pill after that 3)  call in 3  to 4 wks if you would need a higher strength of the sertraline 4)  call if you feel you need counseling in addition to the sertraline 5)  Continue all previous medications as before this visit 6)  Call if you feel you would like referral to dermatologist for the facial rash  7)  you are given the work note today 8)  Please schedule a follow-up appointment in 6 months with CPX labs Prescriptions: FEXOFENADINE HCL 180 MG TABS (FEXOFENADINE HCL) 1 by mouth once daily as needed allergies  #30 x 11   Entered and Authorized by:   Corwin Levins MD   Signed by:   Corwin Levins MD on 02/15/2010   Method used:   Print then Give to Patient   RxID:   [id number] HYDROCODONE-HOMATROPINE 5-1.5 MG/5ML SYRP (HYDROCODONE-HOMATROPINE) 1 tsp by mouth q 6 hrs as needed cough  #6 oz x 1   Entered and Authorized by:   Corwin Levins MD   Signed by:   Corwin Levins MD on 02/15/2010   Method used:   Print then Give to Patient   RxID:   [id number] SERTRALINE HCL 100 MG TABS (SERTRALINE HCL) 1 by mouth once daily  #30 x 11   Entered and Authorized by:   Corwin Levins MD   Signed by:   Corwin Levins MD on 02/15/2010   Method used:   Print  then Give to Patient   RxID:   [id number] LEVOTHYROXINE SODIUM 25 MCG TABS (LEVOTHYROXINE SODIUM) 1 by mouth once daily  #90 x 3   Entered and Authorized by:   Corwin Levins MD   Signed by:   Corwin Levins MD on 02/15/2010   Method used:   Print then Give to Patient   RxID:   [id number] ZOLPIDEM TARTRATE 10 MG TABS (ZOLPIDEM TARTRATE) 1 po at bedtime as needed  #30 x 5   Entered and Authorized by:   Corwin Levins MD   Signed by:   Corwin Levins MD on 02/15/2010   Method used:   Print then Give to Patient   RxID:   [id number]

## 2011-01-10 NOTE — Assessment & Plan Note (Signed)
Summary: COLD,SORE THROAT/CD   Vital Signs:  Patient profile:   40 year old female Height:      63 inches Weight:      299 pounds BMI:     53.16 O2 Sat:      98 % on Room air Temp:     97.6 degrees F oral Pulse rate:   87 / minute BP sitting:   108 / 58  (left arm) Cuff size:   large  Vitals Entered By: Zella Ball Ewing  O2 Flow:  Room air  Preventive Care Screening  Last Tetanus Booster:    Date:  07/13/2008    Results:  Tdap   CC: cold and sore throat/ RE Comments already had the flu shot 10/22   Primary Care [place]fer:  Gweneth Dimitri  CC:  cold and sore throat/ RE.  History of Present Illness: here with coincidental 2 days onset facial pain, pressure, fever and greenish d/c.  Pt denies CP, sob, doe, wheezing, orthopnea, pnd, worsening LE edema, palps, dizziness or syncope  Pt denies new neuro symptoms such as headache, facial or extremity weakness     Problems Prior to Update: 1)  Sinusitis- Acute-nos  (ICD-461.9) 2)  Sinusitis- Acute-nos  (ICD-461.9) 3)  Glucose Intolerance  (ICD-271.3) 4)  Hypertension  (ICD-401.9) 5)  Hypothyroidism  (ICD-244.9) 6)  Dyspnea  (ICD-786.05) 7)  Hx of Pituitary Adenoma  (ICD-227.3) 8)  Obesity  (ICD-278.00)  Medications Prior to Update: 1)  Prenatal Vitamins 0.8 Mg Tabs (Prenatal Multivit-Min-Fe-Fa) .Marland Kitchen.. 1 By Mouth Daily 2)  Cabergoline 0.5 Mg Tabs (Cabergoline) .... 2 Times Weekly 3)  Corzide 80-5 Mg Tabs (Nadolol-Bendroflumethiazide) .Marland Kitchen.. 1 Po  Two Times A Day 4)  Levothyroxine Sodium 25 Mcg Tabs (Levothyroxine Sodium) .Marland Kitchen.. 1 By Mouth Q D 5)  Klor-Con M20 20 Meq Cr-Tabs (Potassium Chloride Crys Cr) .... 2 By Mouth Q D 6)  Azithromycin 250 Mg Tabs (Azithromycin) .... 2po Qd For 1 Day, Then 1po Qd For 4days, Then Stop  Current Medications (verified): 1)  Prenatal Vitamins 0.8 Mg Tabs (Prenatal Multivit-Min-Fe-Fa) .Marland Kitchen.. 1 By Mouth Daily 2)  Cabergoline 0.5 Mg Tabs (Cabergoline) .... 2 Times Weekly 3)  Corzide 80-5 Mg Tabs  (Nadolol-Bendroflumethiazide) .Marland Kitchen.. 1 Po  Two Times A Day 4)  Levothyroxine Sodium 25 Mcg Tabs (Levothyroxine Sodium) .Marland Kitchen.. 1 By Mouth Q D 5)  Klor-Con M20 20 Meq Cr-Tabs (Potassium Chloride Crys Cr) .... 2 By Mouth Q D 6)  Azithromycin 250 Mg Tabs (Azithromycin) .... 2po Qd For 1 Day, Then 1po Qd For 4days, Then Stop 7)  Zolpidem Tartrate 10 Mg Tabs (Zolpidem Tartrate) .Marland Kitchen.. 1 Po At Bedtime As Needed  Allergies (verified): No Known Drug Allergies  Past History:  Past Medical History: Last updated: Jan 27, 2009 Pituitary adenoma Obesity Hypothyroidism - dr Lurene Shadow Hypertension glucose intolerance  Past Surgical History: Last updated: 09/23/2008 Breast reduction 2001 Tubal ligation 2006 D & C  Family History: Last updated: 27-Jan-2009 Father - Car accident - died at approx 105 yo - healthy o/w Mother - HTN Brother - HTN Sister - HTN  Social History: Last updated: January 27, 2009 Married.  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[employer] Never Smoked Alcohol use-no no biological children - had temp custody of child last year  Risk Factors: Smoking Status: never (27-Jan-2009)  Review of Systems  The patient denies anorexia, fever, weight loss, weight gain, vision loss, decreased hearing, hoarseness, chest pain, syncope, peripheral edema, prolonged cough, headaches, hemoptysis, abdominal pain, melena, hematochezia, severe indigestion/heartburn, hematuria,  incontinence, muscle weakness, suspicious skin lesions, transient blindness, difficulty walking, depression, unusual weight change, abnormal bleeding, enlarged lymph nodes, and angioedema.         all otherwise negative per pt -   Physical Exam  General:  alert and overweight-appearing.  , mild ill  Head:  normocephalic and atraumatic.   Eyes:  vision grossly intact, pupils equal, and pupils round.   Ears:  bilat tm's red, sinus tender bilat Nose:  nasal dischargemucosal pallor and mucosal erythema.   Mouth:   pharyngeal erythema and fair dentition.   Neck:  supple and no masses.   Lungs:  normal respiratory effort and normal breath sounds.   Heart:  normal rate and regular rhythm.   Abdomen:  soft, non-tender, and normal bowel sounds.   Msk:  no joint tenderness and no joint swelling.   Extremities:  no edema, no erythema  Neurologic:  cranial nerves II-XII intact and strength normal in all extremities.     Impression & Recommendations:  Problem # 1:  Preventive Health Care (ICD-V70.0) Overall doing well, up to date, counseled on routine health concerns for screening and prevention, immunizations up to date or declined, labs reviewed  Problem # 2:  SINUSITIS- ACUTE-NOS (ICD-461.9)  Her updated medication list for this problem includes:    Azithromycin 250 Mg Tabs (Azithromycin) .Marland Kitchen... 2po qd for 1 day, then 1po qd for 4days, then stop treat as above, f/u any worsening signs or symptoms   Complete Medication List: 1)  Prenatal Vitamins 0.8 Mg Tabs (Prenatal multivit-min-fe-fa) .Marland Kitchen.. 1 by mouth daily 2)  Cabergoline 0.5 Mg Tabs (Cabergoline) .... 2 times weekly 3)  Corzide 80-5 Mg Tabs (Nadolol-bendroflumethiazide) .Marland Kitchen.. 1 po  two times a day 4)  Levothyroxine Sodium 25 Mcg Tabs (Levothyroxine sodium) .Marland Kitchen.. 1 by mouth q d 5)  Klor-con M20 20 Meq Cr-tabs (Potassium chloride crys cr) .... 2 by mouth q d 6)  Azithromycin 250 Mg Tabs (Azithromycin) .... 2po qd for 1 day, then 1po qd for 4days, then stop 7)  Zolpidem Tartrate 10 Mg Tabs (Zolpidem tartrate) .Marland Kitchen.. 1 po at bedtime as needed  Patient Instructions: 1)  please call [facility] for the mammogram 2)  Please take all new medications as prescribed 3)  Continue all previous medications as before this visit  4)  Please continue to f/u with your endocrinologist as you do 5)  you are given the refills today 6)  Please schedule a follow-up appointment in 1 year or sooner if needed 7)  You can cancel the appt you have coming up in a few  wks Prescriptions: ZOLPIDEM TARTRATE 10 MG TABS (ZOLPIDEM TARTRATE) 1 po at bedtime as needed  #30 x 5   Entered and Authorized by:   Corwin Levins MD   Signed by:   Corwin Levins MD on 10/08/2009   Method used:   Print then Give to Patient   RxID:   [id number] PRENATAL VITAMINS 0.8 MG TABS (PRENATAL MULTIVIT-MIN-FE-FA) 1 by mouth daily  #30 x 11   Entered and Authorized by:   Corwin Levins MD   Signed by:   Corwin Levins MD on 10/08/2009   Method used:   Print then Give to Patient   RxID:   [id number] AZITHROMYCIN 250 MG TABS (AZITHROMYCIN) 2po qd for 1 day, then 1po qd for 4days, then stop  #6 x 1   Entered and Authorized by:   Corwin Levins MD   Signed by:  Corwin Levins MD on 10/08/2009   Method used:   Print then Give to Patient   RxID:   [id number]

## 2011-01-10 NOTE — Assessment & Plan Note (Signed)
Summary: [place]/ [employer] [place]/ [place]50 / [place]   Vital Signs:  Patient Profile:   40 Years Old Female Height:     64 inches Weight:      98.1 pounds O2 Sat:      92 % O2 treatment:    Room Air Temp:     98.1 degrees F oral Pulse rate:   95 / minute BP sitting:   136 / 92  (left arm) Cuff size:   large  Vitals Entered By: Payton Spark CMA (December 28, 2008 9:52 AM)                  PCP:  Gweneth Dimitri  Chief Complaint:  new pt.  History of Present Illness: BP at home and at school usualy normal 118 SBP range; recently hosp'd 9/09 with h1n1 for one wk;  here with incidental facial pain, pressure, fever and greenish d/c for 2 days; no hyper or hypo thyroid symptoms, denies polys or low sugars, trying to lose wt    Prior Medications Reviewed Using: Medication Bottles  Updated Prior Medication List: PRENATAL VITAMINS 0.8 MG TABS (PRENATAL MULTIVIT-MIN-FE-FA) 1 by mouth daily CABERGOLINE 0.5 MG TABS (CABERGOLINE) 2 times weekly CORZIDE 80-5 MG TABS (NADOLOL-BENDROFLUMETHIAZIDE) 1 po  two times a day LEVOTHYROXINE SODIUM 25 MCG TABS (LEVOTHYROXINE SODIUM) 1 by mouth q d KLOR-CON M20 20 MEQ CR-TABS (POTASSIUM CHLORIDE CRYS CR) 2 by mouth q d AZITHROMYCIN 250 MG TABS (AZITHROMYCIN) 2po qd for 1 day, then 1po qd for 4days, then stop  Current Allergies (reviewed today): No known allergies   Past Medical History:    Reviewed history from 09/23/2008 and no changes required:       Pituitary adenoma       Obesity       Hypothyroidism - dr Lurene Shadow       Hypertension       glucose intolerance  Past Surgical History:    Reviewed history from 09/23/2008 and no changes required:       Breast reduction 2001       Tubal ligation 2006       D & C   Family History:    Reviewed history from 09/23/2008 and no changes required:       Father - Car accident - died at approx 46 yo - healthy o/w       Mother - HTN       Brother - HTN       Sister - HTN  Social History:    Reviewed history  from 09/23/2008 and no changes required:       Married.        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  , mild ill  Head:     Normocephalic and atraumatic without obvious abnormalities. No apparent alopecia or balding. Eyes:     No corneal or conjunctival inflammation noted. EOMI. Perrla. Funduscopic exam benign, without hemorrhages, exudates or papilledema. Vision grossly normal. Ears:     bilat tm's red, sinus tender  bilat Nose:     nasal dischargemucosal pallor and mucosal erythema.   Mouth:     pharyngeal erythema and fair dentition.   Neck:     supple and full ROM.   Lungs:     Normal respiratory effort, chest expands symmetrically. Lungs are clear to auscultation, no crackles or wheezes. Heart:     Normal rate and regular rhythm. S1 and S2 normal without gallop, murmur, click, rub or other extra sounds. Extremities:     no edema, no ulcers     Impression & Recommendations:  Problem # 1:  SINUSITIS- ACUTE-NOS (ICD-461.9)  The following medications were removed from the medication list:    Nasonex 50 Mcg/act Susp (Mometasone furoate) .Marland Kitchen..Marland Kitchen Two sprays once daily  Her updated medication list for this problem includes:    Azithromycin 250 Mg Tabs (Azithromycin) .Marland Kitchen... 2po qd for 1 day, then 1po qd for 4days, then stop treat as above, f/u any worsening signs or symptoms   Problem # 2:  GLUCOSE INTOLERANCE (ICD-271.3) asympt, cont wt loss efforts, she is trying to concieve  Problem # 3:  HYPERTENSION (ICD-401.9)  Her updated medication list for this problem includes:    Corzide 80-5 Mg Tabs (Nadolol-bendroflumethiazide) .Marland Kitchen... 1 po  two times a day may  need to be changed if becomes pregnant, but stable overall by hx and exam, ok to continue meds/tx as is  BP today: 136/92 Prior BP: 120/80 (09/23/2008)   Problem # 4:  HYPOTHYROIDISM (ICD-244.9)  Her updated medication list for this problem includes:    Levothyroxine Sodium 25 Mcg Tabs (Levothyroxine sodium) .Marland Kitchen... 1 by mouth q d treat as above, f/u any worsening signs or symptoms , declines labs today, ethyroid by exam today  Complete Medication List: 1)  Prenatal Vitamins 0.8 Mg Tabs (Prenatal multivit-min-fe-fa) .Marland Kitchen.. 1 by mouth daily 2)  Cabergoline 0.5 Mg Tabs (Cabergoline) .... 2 times weekly 3)  Corzide 80-5 Mg Tabs (Nadolol-bendroflumethiazide) .Marland Kitchen.. 1 po  two times a day 4)  Levothyroxine Sodium 25 Mcg Tabs (Levothyroxine sodium) .Marland Kitchen.. 1 by mouth q d 5)  Klor-con M20 20 Meq Cr-tabs (Potassium chloride crys cr) .... 2 by mouth q d 6)  Azithromycin 250 Mg Tabs (Azithromycin) .... 2po qd for 1 day, then 1po qd for 4days, then stop   Patient Instructions: 1)  Please take all new medications as prescribed 2)  Continue all medications that you may have been taking previously 3)  Please followup in Sept 2010 with prior CPX labs and: 4)  HbgA1C prior to visit, ICD-9: 790.2   Prescriptions: PRENATAL VITAMINS 0.8 MG TABS (PRENATAL MULTIVIT-MIN-FE-FA) 1 by mouth daily  #90 x 3   Entered and Authorized by:   Corwin Levins MD   Signed by:   Corwin Levins MD on 12/28/2008   Method used:   Print then Give to Patient   RxID:   [id number] CORZIDE 80-5 MG TABS (NADOLOL-BENDROFLUMETHIAZIDE) 1 po  two times a day  #180 x 0   Entered and Authorized by:   Corwin Levins MD   Signed by:   Corwin Levins MD on 12/28/2008   Method used:   Print then Give to Patient   RxID:   [id number] AZITHROMYCIN 250 MG TABS (AZITHROMYCIN) 2po qd for 1 day, then 1po qd for 4days, then stop  #6 x 1   Entered and Authorized by:   Corwin Levins MD   Signed by:   Corwin Levins MD on 12/28/2008  Method used:    Print then Give to Patient   RxID:   [id number]

## 2011-01-10 NOTE — Progress Notes (Signed)
Summary: prolactin  Phone Note Call from Patient   Summary of Call: Pt had physical labs today and would like prolactin level added. Ok to add?  Initial call taken by: Lamar Sprinkles, CMA,  October 01, 2009 10:34 AM  Follow-up for Phone Call        OK with code 253.4 Follow-up by: Corwin Levins MD,  October 01, 2009 2:19 PM  Additional Follow-up for Phone Call Additional follow up Details #1::        Notified pt.Faxed down add-on slip to labs with info. Additional Follow-up by: Orlan Leavens,  October 01, 2009 4:12 PM

## 2011-01-10 NOTE — Progress Notes (Signed)
  Phone Note Refill Request  on October 21, 2009 9:16 AM  Refills Requested: Medication #1:  CORZIDE 80-5 MG TABS 1 po  two times a day   Dosage confirmed as above?Dosage Confirmed   Notes: [facility] Initial call taken by: Scharlene Gloss,  October 21, 2009 9:17 AM    Prescriptions: CORZIDE 80-5 MG TABS (NADOLOL-BENDROFLUMETHIAZIDE) 1 po  two times a day  #180 x 3   Entered by:   Scharlene Gloss   Authorized by:   Corwin Levins MD   Signed by:   Scharlene Gloss on 10/21/2009   Method used:   Electronically to        [facility]* (retail)       [address] [place], [place]  [address]       Ph: [phone]       Fax: [phone]   RxID:   [id number]

## 2011-01-12 NOTE — Assessment & Plan Note (Signed)
Summary: cough/cold/cd   Vital Signs:  Patient profile:   40 year old female Height:      65 inches (165.10 cm) Weight:      302.38 pounds (137.45 kg) BMI:     50.50 O2 Sat:      97 % on Room air Temp:     98.5 degrees F (36.94 degrees C) oral Pulse rate:   75 / minute Pulse rhythm:   regular BP sitting:   118 / 74  (left arm) Cuff size:   large  Vitals Entered By: Brenton Grills CMA Duncan Dull) (December 23, 2010 9:34 AM)  O2 Flow:  Room air CC: Productive cough with yellowish mucus, sore throat, body aches, chills x 2 days/aj Is Patient Diabetic? No   Primary Provider:  Gweneth Dimitri  CC:  Productive cough with yellowish mucus, sore throat, body aches, and chills x 2 days/aj.  History of Present Illness: 2 days of night sweats, sore throat, myalgias, prod cough, fever.  no earache  Current Medications (verified): 1)  Prenatal Vitamins 0.8 Mg Tabs (Prenatal Multivit-Min-Fe-Fa) .Marland Kitchen.. 1 By Mouth Daily 2)  Cabergoline 0.5 Mg Tabs (Cabergoline) .... 2 Times Weekly 3)  Corzide 80-5 Mg Tabs (Nadolol-Bendroflumethiazide) .Marland Kitchen.. 1 Po  Two Times A Day 4)  Levothyroxine Sodium 25 Mcg Tabs (Levothyroxine Sodium) .Marland Kitchen.. 1 By Mouth Once Daily 5)  Klor-Con M20 20 Meq Cr-Tabs (Potassium Chloride Crys Cr) .... 2 By Mouth Once Daily 6)  Zolpidem Tartrate 10 Mg Tabs (Zolpidem Tartrate) .Marland Kitchen.. 1 Po At Bedtime As Needed 7)  Sertraline Hcl 100 Mg Tabs (Sertraline Hcl) .Marland Kitchen.. 1 By Mouth Once Daily 8)  Hydrocodone-Homatropine 5-1.5 Mg/79ml Syrp (Hydrocodone-Homatropine) .Marland Kitchen.. 1 Tsp By Mouth Q 6 Hrs As Needed Cough 9)  Fexofenadine Hcl 180 Mg Tabs (Fexofenadine Hcl) .Marland Kitchen.. 1 By Mouth Once Daily As Needed Allergies 10)  Azithromycin 250 Mg Tabs (Azithromycin) .... 2po Qd For 1 Day, Then 1po Qd For 4days, Then Stop  Allergies (verified): No Known Drug Allergies  Past History:  Past Medical History: Last updated: 02/15/2010 Pituitary adenoma Obesity Hypothyroidism - dr Lurene Shadow Hypertension glucose  intolerance Allergic rhinitis Depression  Review of Systems  The patient denies dyspnea on exertion.    Physical Exam  General:  alert and overweight-appearing.  no distress Head:  head: no deformity eyes: no periorbital swelling, no proptosis external nose and ears are normal mouth: no lesion seen Ears:  right tm has fluid.  left is normal. Lungs:  Clear to auscultation bilaterally. Normal respiratory effort.    Impression & Recommendations:  Problem # 1:  URI (ICD-465.9)  Medications Added to Medication List This Visit: 1)  Cefuroxime Axetil 250 Mg Tabs (Cefuroxime axetil) .Marland Kitchen.. 1 tab two times a day  Other Orders: Est. Patient Level III ([address])  Patient Instructions: 1)  here is a refill of your cough syrup 2)  cefuroxime 250 mg two times a day 3)  take fexofenadine-d (non-prescription, instead of regular fexofenadine) as needed for congestion. 4)  also take tylenol, and drink fluids. Prescriptions: HYDROCODONE-HOMATROPINE 5-1.5 MG/5ML SYRP (HYDROCODONE-HOMATROPINE) 1 tsp by mouth q 6 hrs as needed cough  #6 oz x 1   Entered and Authorized by:   Minus Breeding MD   Signed by:   Minus Breeding MD on 12/23/2010   Method used:   Print then Give to Patient   RxID:   [id number] CEFUROXIME AXETIL 250 MG TABS (CEFUROXIME AXETIL) 1 tab two times a day  #14 x 0  Entered and Authorized by:   Minus Breeding MD   Signed by:   Minus Breeding MD on 12/23/2010   Method used:   Print then Give to Patient   RxID:   [id number]    Orders Added: 1)  Est. Patient Level III [[address]]

## 2011-02-08 ENCOUNTER — Encounter (INDEPENDENT_AMBULATORY_CARE_PROVIDER_SITE_OTHER): Payer: Self-pay | Admitting: *Deleted

## 2011-02-08 ENCOUNTER — Encounter: Payer: Self-pay | Admitting: Internal Medicine

## 2011-02-08 ENCOUNTER — Ambulatory Visit (INDEPENDENT_AMBULATORY_CARE_PROVIDER_SITE_OTHER): Payer: BC Managed Care – PPO | Admitting: Internal Medicine

## 2011-02-08 DIAGNOSIS — E785 Hyperlipidemia, unspecified: Secondary | ICD-10-CM

## 2011-02-08 DIAGNOSIS — J209 Acute bronchitis, unspecified: Secondary | ICD-10-CM

## 2011-02-08 DIAGNOSIS — I10 Essential (primary) hypertension: Secondary | ICD-10-CM

## 2011-02-08 DIAGNOSIS — R062 Wheezing: Secondary | ICD-10-CM | POA: Insufficient documentation

## 2011-02-16 NOTE — Letter (Signed)
Summary: Out of Work  [facility]  [address] [place], [place] [address]   Phone: [phone]  Fax: [phone]    February 08, 2011   Employee:  Elby Showers    To Whom It May Concern:   For Medical reasons, please excuse the above named employee from work for the following dates:  Start:   02/08/2011  End:   02/08/2011  If you need additional information, please feel free to contact our office.         Sincerely,    Dr. Oliver Barre

## 2011-02-16 NOTE — Assessment & Plan Note (Signed)
Summary: sore throat/cd   Vital Signs:  Patient profile:   40 year old female Height:      62 inches Weight:      303.13 pounds BMI:     55.64 O2 Sat:      98 % on Room air Temp:     98.1 degrees F oral Pulse rate:   66 / minute BP sitting:   120 / 70  (left arm) Cuff size:   large  Vitals Entered By: Zella Ball Ewing CMA (AAMA) (February 08, 2011 1:19 PM)  O2 Flow:  Room air CC: Sore throat and cough/RE   Primary Care Provider:  Gweneth Dimitri  CC:  Sore throat and cough/RE.  History of Present Illness: here with acute onset 2-3 days fever, HA, general weaknesss and malaise, ST , bialt earache, fullness, popping, sinus pain and pressure, and now with increaesd prod cough with greenish sputum, as well as mild wheeze and chest tightness such that it is more difficult to take deep breaths.  Pt denies CP, orthopnea, pnd, worsening LE edema, palps, dizziness or syncope  Pt denies new neuro symptoms such as headache, facial or extremity weakness .  Pt denies polydipsia, polyuria.  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  No recent wt loss, night sweats, loss of appetite or other constitutional symptoms  Overall good compliance with meds, and good tolerability. Could not go to work today due to illness.  Problems Prior to Update: 1)  Other Specified Forms of Hearing Loss  (ICD-389.8) 2)  Sinusitis- Acute-nos  (ICD-461.9) 3)  Depression  (ICD-311) 4)  Rash-nonvesicular  (ICD-782.1) 5)  Allergic Rhinitis  (ICD-477.9) 6)  Uri  (ICD-465.9) 7)  Preventive Health Care  (ICD-V70.0) 8)  Sinusitis- Acute-nos  (ICD-461.9) 9)  Sinusitis- Acute-nos  (ICD-461.9) 10)  Glucose Intolerance  (ICD-271.3) 11)  Hypertension  (ICD-401.9) 12)  Hypothyroidism  (ICD-244.9) 13)  Dyspnea  (ICD-786.05) 14)  Hx of Pituitary Adenoma  (ICD-227.3) 15)  Obesity  (ICD-278.00)  Medications Prior to Update: 1)  Prenatal Vitamins 0.8 Mg Tabs (Prenatal Multivit-Min-Fe-Fa) .Marland Kitchen.. 1 By Mouth Daily 2)   Cabergoline 0.5 Mg Tabs (Cabergoline) .... 2 Times Weekly 3)  Corzide 80-5 Mg Tabs (Nadolol-Bendroflumethiazide) .Marland Kitchen.. 1 Po  Two Times A Day 4)  Levothyroxine Sodium 25 Mcg Tabs (Levothyroxine Sodium) .Marland Kitchen.. 1 By Mouth Once Daily 5)  Klor-Con M20 20 Meq Cr-Tabs (Potassium Chloride Crys Cr) .... 2 By Mouth Once Daily 6)  Zolpidem Tartrate 10 Mg Tabs (Zolpidem Tartrate) .Marland Kitchen.. 1 Po At Bedtime As Needed 7)  Sertraline Hcl 100 Mg Tabs (Sertraline Hcl) .Marland Kitchen.. 1 By Mouth Once Daily 8)  Hydrocodone-Homatropine 5-1.5 Mg/61ml Syrp (Hydrocodone-Homatropine) .Marland Kitchen.. 1 Tsp By Mouth Q 6 Hrs As Needed Cough 9)  Fexofenadine Hcl 180 Mg Tabs (Fexofenadine Hcl) .Marland Kitchen.. 1 By Mouth Once Daily As Needed Allergies 10)  Cefuroxime Axetil 250 Mg Tabs (Cefuroxime Axetil) .Marland Kitchen.. 1 Tab Two Times A Day  Current Medications (verified): 1)  Prenatal Vitamins 0.8 Mg Tabs (Prenatal Multivit-Min-Fe-Fa) .Marland Kitchen.. 1 By Mouth Daily 2)  Cabergoline 0.5 Mg Tabs (Cabergoline) .... 2 Times Weekly 3)  Corzide 80-5 Mg Tabs (Nadolol-Bendroflumethiazide) .Marland Kitchen.. 1 Po  Two Times A Day 4)  Levothyroxine Sodium 25 Mcg Tabs (Levothyroxine Sodium) .Marland Kitchen.. 1 By Mouth Once Daily 5)  Klor-Con M20 20 Meq Cr-Tabs (Potassium Chloride Crys Cr) .... 2 By Mouth Once Daily 6)  Zolpidem Tartrate 10 Mg Tabs (Zolpidem Tartrate) .Marland Kitchen.. 1 Po At Bedtime As Needed 7)  Sertraline Hcl  100 Mg Tabs (Sertraline Hcl) .Marland Kitchen.. 1 By Mouth Once Daily 8)  Hydrocodone-Homatropine 5-1.5 Mg/65ml Syrp (Hydrocodone-Homatropine) .Marland Kitchen.. 1 Tsp By Mouth Q 6 Hrs As Needed Cough 9)  Fexofenadine Hcl 180 Mg Tabs (Fexofenadine Hcl) .Marland Kitchen.. 1 By Mouth Once Daily As Needed Allergies 10)  Azithromycin 250 Mg Tabs (Azithromycin) .... 2po Qd For 1 Day, Then 1po Qd For 4days, Then Stop 11)  Hydrocodone-Homatropine 5-1.5 Mg/80ml Syrp (Hydrocodone-Homatropine) .Marland Kitchen.. 1 Tsp By Mouth Q 6 Hrs As Needed Cough 12)  Prednisone 10 Mg Tabs (Prednisone) .Marland Kitchen.. 1po Once Daily 13)  Nasonex 50 Mcg/act Susp (Mometasone Furoate) .... 2  Spray/side Once Daily  Allergies (verified): No Known Drug Allergies  Past History:  Past Surgical History: Last updated: 08/31/2010 Breast reduction 2001 Tubal ligation 2006 D & C adn hysteroscopy aug 2011 - neg with Dr Chevis Pretty  Social History: Last updated: 12/28/2008 Married.  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[employer] Never Smoked Alcohol use-no no biological children - had temp custody of child last year  Risk Factors: Smoking Status: never (12/28/2008)  Past Medical History: Pituitary adenoma Obesity Hypothyroidism - dr Lurene Shadow Hypertension glucose intolerance Allergic rhinitis Depression Hyperlipidemia  Review of Systems       all otherwise negative per pt -    Physical Exam  General:  alert and overweight-appearing.  ,mild ill  Head:  normocephalic and atraumatic.   Eyes:  vision grossly intact, pupils equal, and pupils round.   Ears:  R ear normal.  but left TM severe erythema, sinus nontender, canals clear Nose:  nasal dischargemucosal pallor and mucosal edema.   Mouth:  pharyngeal erythema and fair dentition.   Neck:  supple and cervical lymphadenopathy.   Lungs:  normal respiratory effort, R decreased breath sounds, R wheezes, L decreased breath sounds, and L wheezes.   Heart:  normal rate and regular rhythm.   Extremities:  no edema, no erythema    Impression & Recommendations:  Problem # 1:  BRONCHITIS-ACUTE (ICD-466.0)  Her updated medication list for this problem includes:    Hydrocodone-homatropine 5-1.5 Mg/73ml Syrp (Hydrocodone-homatropine) .Marland Kitchen... 1 tsp by mouth q 6 hrs as needed cough    Azithromycin 250 Mg Tabs (Azithromycin) .Marland Kitchen... 2po qd for 1 day, then 1po qd for 4days, then stop treat as above, f/u any worsening signs or symptoms , ok for work note for today as well   Take antibiotics and other medications as directed. Encouraged to push clear liquids, get enough rest, and take acetaminophen as needed. To be seen in 5-7  days if no improvement, sooner if worse.  Problem # 2:  WHEEZING (ICD-786.07) mild, liekly due to above;  for predpack for home  Problem # 3:  HYPERTENSION (ICD-401.9)  Her updated medication list for this problem includes:    Corzide 80-5 Mg Tabs (Nadolol-bendroflumethiazide) .Marland Kitchen... 1 po  two times a day  BP today: 120/70 Prior BP: 118/74 (12/23/2010)  Labs Reviewed: K+: 3.5 (08/31/2010) Creat: : 0.5 (08/31/2010)   Chol: 225 (08/31/2010)   HDL: 47.30 (08/31/2010)   TG: 131.0 (08/31/2010) stable overall by hx and exam, ok to continue meds/tx as is   Problem # 4:  HYPERLIPIDEMIA (ICD-272.4)  Labs Reviewed: SGOT: 26 (08/31/2010)   SGPT: 27 (08/31/2010)   HDL:47.30 (08/31/2010), 52.10 (10/01/2009)  Chol:225 (08/31/2010), 232 (10/01/2009)  Trig:131.0 (08/31/2010), 67.0 (10/01/2009) d/w pt - declines statin - Pt to continue diet efforts, good med tolerance; to check labs next visit- goal LDL less than 100   Complete  Medication List: 1)  Prenatal Vitamins 0.8 Mg Tabs (Prenatal multivit-min-fe-fa) .Marland Kitchen.. 1 by mouth daily 2)  Cabergoline 0.5 Mg Tabs (Cabergoline) .... 2 times weekly 3)  Corzide 80-5 Mg Tabs (Nadolol-bendroflumethiazide) .Marland Kitchen.. 1 po  two times a day 4)  Levothyroxine Sodium 25 Mcg Tabs (Levothyroxine sodium) .Marland Kitchen.. 1 by mouth once daily 5)  Klor-con M20 20 Meq Cr-tabs (Potassium chloride crys cr) .... 2 by mouth once daily 6)  Zolpidem Tartrate 10 Mg Tabs (Zolpidem tartrate) .Marland Kitchen.. 1 po at bedtime as needed 7)  Sertraline Hcl 100 Mg Tabs (Sertraline hcl) .Marland Kitchen.. 1 by mouth once daily 8)  Hydrocodone-homatropine 5-1.5 Mg/58ml Syrp (Hydrocodone-homatropine) .Marland Kitchen.. 1 tsp by mouth q 6 hrs as needed cough 9)  Fexofenadine Hcl 180 Mg Tabs (Fexofenadine hcl) .Marland Kitchen.. 1 by mouth once daily as needed allergies 10)  Azithromycin 250 Mg Tabs (Azithromycin) .... 2po qd for 1 day, then 1po qd for 4days, then stop 11)  Hydrocodone-homatropine 5-1.5 Mg/79ml Syrp (Hydrocodone-homatropine) .Marland Kitchen.. 1 tsp by mouth  q 6 hrs as needed cough 12)  Prednisone 10 Mg Tabs (Prednisone) .Marland Kitchen.. 1po once daily 13)  Nasonex 50 Mcg/act Susp (Mometasone furoate) .... 2 spray/side once daily  Patient Instructions: 1)  Please take all new medications as prescribed 2)  Continue all previous medications as before this visit  3)  You are given the work note today 4)  Please schedule a follow-up appointment in sept 2012 for CPX with labs. Prescriptions: NASONEX 50 MCG/ACT SUSP (MOMETASONE FUROATE) 2 spray/side once daily  #1 x 11   Entered and Authorized by:   Corwin Levins MD   Signed by:   Corwin Levins MD on 02/08/2011   Method used:   Print then Give to Patient   RxID:   [id number] ZOLPIDEM TARTRATE 10 MG TABS (ZOLPIDEM TARTRATE) 1 po at bedtime as needed  #90 x 1   Entered and Authorized by:   Corwin Levins MD   Signed by:   Corwin Levins MD on 02/08/2011   Method used:   Print then Give to Patient   RxID:   [id number] KLOR-CON M20 20 MEQ CR-TABS (POTASSIUM CHLORIDE CRYS CR) 2 by mouth once daily  #180 x 3   Entered and Authorized by:   Corwin Levins MD   Signed by:   Corwin Levins MD on 02/08/2011   Method used:   Print then Give to Patient   RxID:   [id number] PREDNISONE 10 MG TABS (PREDNISONE) 1po once daily  #7 x 0   Entered and Authorized by:   Corwin Levins MD   Signed by:   Corwin Levins MD on 02/08/2011   Method used:   Print then Give to Patient   RxID:   [id number] HYDROCODONE-HOMATROPINE 5-1.5 MG/5ML SYRP (HYDROCODONE-HOMATROPINE) 1 tsp by mouth q 6 hrs as needed cough  #6oz x 1   Entered and Authorized by:   Corwin Levins MD   Signed by:   Corwin Levins MD on 02/08/2011   Method used:   Print then Give to Patient   RxID:   [id number] AZITHROMYCIN 250 MG TABS (AZITHROMYCIN) 2po qd for 1 day, then 1po qd for 4days, then stop  #6 x 1   Entered and Authorized by:   Corwin Levins MD   Signed by:   Corwin Levins MD on 02/08/2011   Method used:   Print then Give to Patient   RxID:    [id number]  Orders Added: 1)  Est. Patient Level IV [id number]

## 2011-04-11 ENCOUNTER — Other Ambulatory Visit: Payer: Self-pay | Admitting: Endocrinology

## 2011-04-11 DIAGNOSIS — E049 Nontoxic goiter, unspecified: Secondary | ICD-10-CM

## 2011-04-18 ENCOUNTER — Ambulatory Visit
Admission: RE | Admit: 2011-04-18 | Discharge: 2011-04-18 | Disposition: A | Discharge status: Home / Self Care | Payer: BC Managed Care – PPO | Source: Ambulatory Visit | Attending: Endocrinology | Admitting: Endocrinology

## 2011-04-18 DIAGNOSIS — E049 Nontoxic goiter, unspecified: Secondary | ICD-10-CM

## 2011-04-25 NOTE — H&P (Signed)
NAMEMORANDA, BILLIOT            ACCOUNT NO.:  [id number]   MEDICAL RECORD NO.:  [id number]          PATIENT TYPE:  OBV   LOCATION:  1514                         FACILITY:  [facility]   PHYSICIAN:  Corinna L. Lendell Caprice, MDDATE OF BIRTH:  05-08-1971   DATE OF ADMISSION:  08/31/2008  DATE OF DISCHARGE:                              HISTORY & PHYSICAL   CHIEF COMPLAINT:  Cough, fever.   HISTORY OF PRESENT ILLNESS:  [name] is a 40 year old white female  who presents with a several week history of fevers and chills.  She  apparently had improved and then started worsening over the past 3 days.  She has had temperatures up to 104.  She has had vomiting.  She has been  able to take liquids, but has not eaten much for several days.  She has  had diarrhea.  She has a headache.  She has a sore throat.  She has a  nonproductive cough.  She feels short of breath.  She has myalgias.  She  has a 28-year-old child who had a fever last night.  She is a Runner, broadcasting/film/video.  She feels very weak.  She has had no hematemesis, melena or  hematochezia.  She had been diagnosed with possible pneumonia and  started on Augmentin several days ago.  She continues to worsen.   PAST MEDICAL HISTORY:  1. Diabetes.  2. Obesity.  3. Pituitary adenoma.   MEDICATIONS:  1. Metformin, dose unknown.  2. Prenatal vitamin.  3. Cabergoline.   ALLERGIES:  CODEINE.   SOCIAL HISTORY:  She is married.  She teaches the fourth grade.  She  does not smoke.  She does not use drugs.  She does not drink heavily.  She is married and here with her husband.   FAMILY HISTORY:  Noncontributory.   REVIEW OF SYSTEMS:  As above; otherwise negative.   PHYSICAL EXAMINATION:  VITAL SIGNS:  Temperature is 103.1, blood  pressure 144/88, pulse 110, respiratory rate 20, oxygen saturation 96%  on room air.  GENERAL:  The patient is an ill-appearing black female who is coughing  and appears to be in some pain.  HEENT:  Normocephalic, atraumatic.   Pupils equal, round, reactive to  light.  Her eyes are tearing.  No conjunctivitis.  She has slightly dry  mucous membranes.  Oropharynx is without erythema or exudate.  Nares  with no drainage.  NECK:  Supple, thick.  No lymphadenopathy.  LUNGS:  Clear to auscultation bilaterally without wheezes, rhonchi or  rales.  CARDIOVASCULAR:  Regular rhythm, fast.  No murmurs, gallops or rubs.  ABDOMEN:  Normal bowel sounds, soft, nontender, nondistended.  GU/RECTAL:  Deferred.  EXTREMITIES:  No clubbing, cyanosis or edema.  SKIN:  No rash.  PSYCHIATRIC:  The patient has normal affect.  She is cooperative.  Appears anxious.  NEUROLOGIC:  Alert and oriented.  Cranial nerves and sensory motor exam  intact.   LABORATORY DATA:  CBC is significant for a hemoglobin of 11.9,  hematocrit 35.5.  Otherwise unremarkable.  [facility]inal[facility] from [facility] showed specific gravity of 1.010, pH 7,  leukocyte esterase trace, negative nitrites, trace protein, negative  glucose, negative ketones, negative bilirubin, negative blood.  Rapid  strep negative.  Rapid influenza negative.  Chest x-ray done here in the  emergency room, two views, shows no infiltrate.   ASSESSMENT/PLAN:  1. Flu-like illness.  As she is not eating well and is having      vomiting, it is reasonable to place her in 23-hour observation on      droplet precautions.  She is outside the window for Tamiflu.  She      is not hypoxic.  She will be placed in a regular bed and get      supportive care including a cough suppressant, Tylenol and      ibuprofen, antiemetics, Dilaudid as needed for her headache and      vigorous IV hydration.  I will get a screen for H1 N1.  2. Diabetes.  Monitor CBGs use and hold metformin for now.  3. Pituitary adenoma.   In addition to above, I will check a complete metabolic panel and  hemoglobin A1c.      Corinna L. Lendell Caprice, MD  Electronically Signed     CLS/MEDQ  D:   08/31/2008  T:  09/01/2008  Job:  [id number]   cc:   Gretta Arab. Valentina Lucks, M.D.  Fax: [phone]

## 2011-04-25 NOTE — Discharge Summary (Signed)
Chelsea Thompson, Chelsea Thompson NO.:  [id number]   MEDICAL RECORD NO.:  [id number]          PATIENT TYPE:  OUT   LOCATION:  MRI                          FACILITY:  [facility]   PHYSICIAN:  Hollice Espy, M.D.DATE OF BIRTH:  06-03-1971   DATE OF ADMISSION:  09/04/2008  DATE OF DISCHARGE:  09/04/2008                               DISCHARGE SUMMARY   PRIMARY CARE [facility]:  Dr. Gweneth Dimitri.   DISCHARGE DIAGNOSES:  1. Influenza.  2. Diarrhea secondary to #1.  3. Headache secondary to #1.  4. Hypokalemia now resolved.  5. Obesity.  6. History of diabetes mellitus.  7. History of pituitary adenoma.   DISCHARGE MEDICATIONS:  1. The patient continue all previous medications.  2. Prenatal vitamin.  3. Cabergoline.  4. Corzide 80/5 p.o. daily.  5. Metformin.   NEW MEDICATIONS:  1. Ambien 10 mg p.o. nightly p.r.n.  2. Avelox 400 mg p.o. daily x5 days.  3. Ultram 50 mg p.o. q.6 hours p.r.n. pain.  4. Tessalon Perles 100 mg q.8 hours as needed for cough.   HOSPITAL COURSE:  The patient is 40 year old African American female  with past other history diabetes mellitus, pituitary adenoma and obesity  who presented with complaints of diarrhea, fevers, chills and headache.  She was evaluated in the emergency room and had a temperature of 103.3.  Initially her white count noted to be 11.9.  Her chest x-ray showed no  infiltrates and it was felt likely that she had signs of flu-like  illness.  She was outside the window for Tamiflu.  She is not hypoxic.  She was admitted, started on IV fluids, and placed on supportive  measures.  Over the next week, the patient had an initially rough course  with severe headaches.  There was concern at one point that the patient  was taking a lot of narcotic pain medication for the headaches leading  to some rebound headaches.  Narcotics were limited.  She was placed more  on Tylenol and Ultram, which she tolerated relatively well, but still  at  times looked to be having some narcotic seeking behavior.  Nevertheless,  her headaches improved and her diet was slowly advanced over the course  of the week.  By September 25 she was feeling a little bit better but  complaining of severe headache.  She was sent for MRI to ensure that she  had no meningitis/encephalitis picture, but her MRI was found to be  normal.  She otherwise remained stable and by September 26, her symptoms  were much improved and she was tolerating solid food.  Regarding her  diabetes and pituitary adenoma, these were stable and her medications  were continued.  She was also complaining of cough with greenish sputum,  and essentially might be developing a bronchitis.  So a repeat chest x-  ray was done showing signs of atelectasis.  However, the patient was  continuing to spike temperatures and so she was started on Avelox 400  mg.  She will need to complete 5 more days as an outpatient to complete  a 7-day course.  She  is being discharged on Ambien to help her sleep,  Tessalon Perles for cough, completion course of antibiotics, and Ultram  for pain.  The patient's overall disposition is improved.  Her activity  will be slow to increase.  Her discharge diet will be carb modified  diet.  She is being discharged to home.  She will follow up with Dr.  Corliss Blacker in 1 week's time.      Hollice Espy, M.D.  Electronically Signed     SKK/MEDQ  D:  09/05/2008  T:  09/06/2008  Job:  [id number]   cc:   Pam Drown, M.D.  Fax: [phone]

## 2011-04-26 ENCOUNTER — Other Ambulatory Visit: Payer: Self-pay

## 2011-04-26 MED ORDER — LEVOTHYROXINE SODIUM 25 MCG PO TABS: 25.0000 ug | ORAL_TABLET | Freq: Every day | ORAL | Status: DC

## 2011-04-28 NOTE — Op Note (Signed)
[place]  Patient:    Chelsea Thompson, Chelsea Thompson                       MRN: [id number] Proc. Date: 04/02/00 Adm. Date:  [id number] Attending:  Gustavus Messing CC:         Yaakov Guthrie. Shon Hough, M.D. x 2                           Operative Report  HISTORY:  A 40 year old lady with severe, severe macromastia causing back and shoulder pain.  Also increased accessory breast tissue bilaterally.  PROCEDURES DONE:  Bilateral breast reductions using the inferior pedicle technique, reduction of accessory breast tissue.  ATTENDING:  Yaakov Guthrie. Shon Hough, M.D.  Threasa HeadsTalbert Forest ______  ANESTHESIA:  General.  PROCEDURE:  The patient was sat up and drawn off for the inferior pedicle reduction mammoplasty, remarking the nipple-areolar complex back to 21 cm from the suprasternal notch.  She then underwent general anesthesia, intubated orally. rep was done to the chest, breast areas in the routine fashion using Betadine soap nd solution and walled off with sterile towels and drapes so as to make a sterile field.  Xylocaine 0.25% with epinephrine in a 1:400,000 concentration was injected locally for vasoconstriction, a total of 150 cc.  The wounds were scored with #15 blades.  Next, the skin over the inferior pedicle was deepithelialized with a #20 blades.  The medial and lateral fatty dermal pedicles were excised down to underlying fascia.  After proper hemostasis using the Bovie unit on coagulation, excess breast tissue and accessory breast tissue was removed over the right axillary and latissimus dorsi area with tedious dissection.  After this, a new keyholed area was debulked.  Then after this, the flaps were transposed and stayed with 3-0 Prolene.  Subcutaneous closure was done with 3-0 Monocryl x 2 layers, hen running subcuticular stitches of 3-0 Monocryl and 5-0 Monocryl throughout the inverted T.  The wounds were drained with #10 Blake drains,  which were placed in the depths of the wound and brought out through the lateral most portion of the  incisions and secured with 3-0 Prolene.  The wounds were cleansed, Steri-Strips and soft bulky dressings were applied, including Xeroform, 4 x 4s, ABDs, Hypafix tape. The same procedure was carried out on both sides, removing approximately 2000 g per side.  She was then taken to recovery in excellent condition. DD:  04/02/00 TD:  04/02/00 Job: [id number]

## 2011-09-07 ENCOUNTER — Other Ambulatory Visit: Payer: Self-pay

## 2011-09-07 MED ORDER — ZOLPIDEM TARTRATE 10 MG PO TABS: 10.0000 mg | ORAL_TABLET | Freq: Every evening | ORAL | Status: DC | PRN

## 2011-09-07 NOTE — Telephone Encounter (Signed)
Ok for the [place], but the cabergoline (used for elevated prolactin) is a "specialized" medication not normally prescribed by general internists;  It is most often prescribed per endocrinology

## 2011-09-08 ENCOUNTER — Ambulatory Visit: Payer: BC Managed Care – PPO | Admitting: Internal Medicine

## 2011-09-08 DIAGNOSIS — Z0289 Encounter for other administrative examinations: Secondary | ICD-10-CM

## 2011-09-08 NOTE — Telephone Encounter (Signed)
Faxed hardcopy to [facility] [address] [place] 

## 2011-09-11 LAB — DIFFERENTIAL
Basophils Absolute: 0
Basophils Absolute: 0
Basophils Absolute: 0
Basophils Relative: 0
Basophils Relative: 0
Basophils Relative: 0
Eosinophils Absolute: 0
Eosinophils Absolute: 0
Eosinophils Absolute: 0.1
Eosinophils Relative: 0
Eosinophils Relative: 1
Eosinophils Relative: 2
Lymphocytes Relative: 17
Lymphocytes Relative: 41
Lymphocytes Relative: 6 — ABNORMAL LOW
Lymphs Abs: 0.4 — ABNORMAL LOW
Lymphs Abs: 1.5
Lymphs Abs: 1.6
Monocytes Absolute: 0.3
Monocytes Absolute: 0.6
Monocytes Absolute: 0.6
Monocytes Relative: 10
Monocytes Relative: 7
Monocytes Relative: 8
Neutro Abs: 2
Neutro Abs: 5.1
Neutro Abs: 6.8
Neutrophils Relative %: 49
Neutrophils Relative %: 76
Neutrophils Relative %: 84 — ABNORMAL HIGH

## 2011-09-11 LAB — CBC
HCT: 31.9 — ABNORMAL LOW
HCT: 32.4 — ABNORMAL LOW
HCT: 32.7 — ABNORMAL LOW
HCT: 34.5 — ABNORMAL LOW
HCT: 35.5 — ABNORMAL LOW
Hemoglobin: 10.5 — ABNORMAL LOW
Hemoglobin: 10.7 — ABNORMAL LOW
Hemoglobin: 10.8 — ABNORMAL LOW
Hemoglobin: 11.4 — ABNORMAL LOW
Hemoglobin: 11.9 — ABNORMAL LOW
MCHC: 32.7
MCHC: 32.9
MCHC: 33.1
MCHC: 33.2
MCHC: 33.5
MCV: 85.1
MCV: 85.7
MCV: 85.9
MCV: 85.9
MCV: 86.1
Platelets: 261
Platelets: 262
Platelets: 263
Platelets: 283
Platelets: 321
RBC: 3.7 — ABNORMAL LOW
RBC: 3.78 — ABNORMAL LOW
RBC: 3.8 — ABNORMAL LOW
RBC: 4.02
RBC: 4.17
RDW: 13.9
RDW: 14.5
RDW: 14.6
RDW: 14.7
RDW: 14.8
WBC: 3.7 — ABNORMAL LOW
WBC: 3.8 — ABNORMAL LOW
WBC: 4
WBC: 6.1
WBC: 8.9

## 2011-09-11 LAB — GLUCOSE, CAPILLARY
Glucose-Capillary: 101 — ABNORMAL HIGH
Glucose-Capillary: 103 — ABNORMAL HIGH
Glucose-Capillary: 104 — ABNORMAL HIGH
Glucose-Capillary: 110 — ABNORMAL HIGH
Glucose-Capillary: 128 — ABNORMAL HIGH
Glucose-Capillary: 86
Glucose-Capillary: 87
Glucose-Capillary: 87
Glucose-Capillary: 87
Glucose-Capillary: 89
Glucose-Capillary: 90
Glucose-Capillary: 90
Glucose-Capillary: 90
Glucose-Capillary: 90
Glucose-Capillary: 91
Glucose-Capillary: 94
Glucose-Capillary: 98

## 2011-09-11 LAB — COMPREHENSIVE METABOLIC PANEL
ALT: 30
ALT: 35
AST: 27
AST: 29
Albumin: 2.6 — ABNORMAL LOW
Albumin: 2.9 — ABNORMAL LOW
Alkaline Phosphatase: 107
Alkaline Phosphatase: 95
BUN: 2 — ABNORMAL LOW
BUN: <1 — ABNORMAL LOW
CO2: 26
CO2: 32
Calcium: 8.2 — ABNORMAL LOW
Calcium: 8.3 — ABNORMAL LOW
Chloride: 105
Chloride: 98
Creatinine, Ser: 0.71
Creatinine, Ser: 0.81
GFR calc Af Amer: >60
GFR calc Af Amer: >60
GFR calc non Af Amer: >60
GFR calc non Af Amer: >60
Glucose, Bld: 103 — ABNORMAL HIGH
Glucose, Bld: 104 — ABNORMAL HIGH
Potassium: 2.7 — CL
Potassium: 4.2
Sodium: 137
Sodium: 137
Total Bilirubin: 0.4
Total Bilirubin: 0.6
Total Protein: 6.3
Total Protein: 6.6

## 2011-09-11 LAB — BASIC METABOLIC PANEL
BUN: 1 — ABNORMAL LOW
BUN: 1 — ABNORMAL LOW
BUN: 2 — ABNORMAL LOW
BUN: 2 — ABNORMAL LOW
BUN: 3 — ABNORMAL LOW
CO2: 27
CO2: 27
CO2: 28
CO2: 30
CO2: 30
Calcium: 8 — ABNORMAL LOW
Calcium: 8.1 — ABNORMAL LOW
Calcium: 8.1 — ABNORMAL LOW
Calcium: 8.3 — ABNORMAL LOW
Calcium: 8.6
Chloride: 102
Chloride: 105
Chloride: 108
Chloride: 110
Chloride: 99
Creatinine, Ser: 0.57
Creatinine, Ser: 0.66
Creatinine, Ser: 0.67
Creatinine, Ser: 0.72
Creatinine, Ser: 0.73
GFR calc Af Amer: >60
GFR calc Af Amer: >60
GFR calc Af Amer: >60
GFR calc Af Amer: >60
GFR calc non Af Amer: >60
GFR calc non Af Amer: >60
GFR calc non Af Amer: >60
GFR calc non Af Amer: >60
GFR calc non Af Amer: >60
Glucose, Bld: 101 — ABNORMAL HIGH
Glucose, Bld: 103 — ABNORMAL HIGH
Glucose, Bld: 93
Glucose, Bld: 94
Glucose, Bld: 97
Potassium: 2.5 — CL
Potassium: 3.2 — ABNORMAL LOW
Potassium: 3.2 — ABNORMAL LOW
Potassium: 3.6
Potassium: 4.2
Sodium: 138
Sodium: 138
Sodium: 138
Sodium: 141
Sodium: 142

## 2011-09-11 LAB — URINALYSIS, ROUTINE W REFLEX MICROSCOPIC
Bilirubin Urine: NEGATIVE
Glucose, UA: NEGATIVE
Hgb urine dipstick: NEGATIVE
Ketones, ur: NEGATIVE
Nitrite: NEGATIVE
Protein, ur: NEGATIVE
Specific Gravity, Urine: 1.006
Urobilinogen, UA: 0.2
pH: 7

## 2011-09-11 LAB — CULTURE, BLOOD (ROUTINE X 2)
Culture: NO GROWTH
Culture: NO GROWTH
Culture: NO GROWTH
Culture: NO GROWTH

## 2011-09-11 LAB — HEMOGLOBIN A1C
Hgb A1c MFr Bld: 5.3
Mean Plasma Glucose: 105

## 2011-09-11 LAB — MAGNESIUM: Magnesium: 1.9

## 2011-09-11 LAB — INFLUENZA A+B VIRUS AG-DIRECT(RAPID)
Inflenza A Ag: NEGATIVE
Influenza B Ag: NEGATIVE

## 2011-09-11 LAB — SEDIMENTATION RATE: Sed Rate: 47 — ABNORMAL HIGH

## 2011-09-11 LAB — ANA: Anti Nuclear Antibody(ANA): NEGATIVE

## 2011-09-25 LAB — BASIC METABOLIC PANEL
BUN: 5 — ABNORMAL LOW
CO2: 29
Calcium: 9.1
Chloride: 103
Creatinine, Ser: 0.68
GFR calc Af Amer: >60
GFR calc non Af Amer: >60
Glucose, Bld: 91
Potassium: 3.4 — ABNORMAL LOW
Sodium: 136

## 2011-09-25 LAB — CBC
HCT: 37.8
Hemoglobin: 12.8
MCHC: 33.9
MCV: 83.9
Platelets: 329
RBC: 4.5
RDW: 13.7
WBC: 7.2

## 2011-09-25 LAB — DIFFERENTIAL
Basophils Absolute: 0
Basophils Relative: 0
Eosinophils Absolute: 0.3
Eosinophils Relative: 4
Lymphocytes Relative: 30
Lymphs Abs: 2.2
Monocytes Absolute: 0.8 — ABNORMAL HIGH
Monocytes Relative: 11
Neutro Abs: 4
Neutrophils Relative %: 55

## 2011-09-25 LAB — POCT CARDIAC MARKERS
CKMB, poc: 1.2
Myoglobin, poc: 41.2
Operator id: 151321
Troponin i, poc: <0.05

## 2011-09-25 LAB — D-DIMER, QUANTITATIVE (NOT AT ARMC): D-Dimer, Quant: <0.22

## 2011-09-30 ENCOUNTER — Encounter: Payer: Self-pay | Admitting: Family Medicine

## 2011-09-30 ENCOUNTER — Ambulatory Visit (INDEPENDENT_AMBULATORY_CARE_PROVIDER_SITE_OTHER): Payer: BC Managed Care – PPO | Admitting: Family Medicine

## 2011-09-30 VITALS — BP 122/78 | HR 95 | Temp 97.3°F | Wt 314.0 lb

## 2011-09-30 DIAGNOSIS — H6691 Otitis media, unspecified, right ear: Secondary | ICD-10-CM

## 2011-09-30 DIAGNOSIS — H6690 Otitis media, unspecified, unspecified ear: Secondary | ICD-10-CM

## 2011-09-30 DIAGNOSIS — J069 Acute upper respiratory infection, unspecified: Secondary | ICD-10-CM

## 2011-09-30 MED ORDER — AZITHROMYCIN 250 MG PO TABS: ORAL_TABLET | ORAL | Status: AC

## 2011-09-30 MED ORDER — GUAIFENESIN-CODEINE 100-10 MG/5ML PO SOLN: 5.0000 mL | Freq: Three times a day (TID) | ORAL | Status: AC | PRN

## 2011-09-30 NOTE — Patient Instructions (Signed)
Drink lots of fluids and get rest  Take zithromax as directed for ear infection  Take cough med with caution due to sedation  Tylenol is ok for fever  update [place] if worse or not improved in 1 week

## 2011-09-30 NOTE — Progress Notes (Signed)
Subjective:    Patient ID: Chelsea Thompson, female    DOB: Apr 03, 1971, 40 y.o.   MRN: [id number]  HPI Yesterday started getting sick with a ST and ear pain -- R - now both  Stuffy and sneezing and hoarse achey- has not taken temp but had chills  Some sinus pain  Also had a tooth ache -- lower (had root canal in that tooth -- this summer )  Some nausea , no v/d  Was taking some day quil   Patient Active Problem List  Diagnoses  . PITUITARY ADENOMA  . HYPOTHYROIDISM  . GLUCOSE INTOLERANCE  . OBESITY  . DEPRESSION  . OTHER SPECIFIED FORMS OF HEARING LOSS  . HYPERTENSION  . SINUSITIS- ACUTE-NOS  . URI  . ALLERGIC RHINITIS  . RASH-NONVESICULAR  . DYSPNEA  . HYPERLIPIDEMIA  . WHEEZING  . Right otitis media   Past Medical History  Diagnosis Date  . Pituitary adenoma   . Obesity   . Hypothyroidism     DR Lurene Shadow  . HTN (hypertension)   . Glucose intolerance (impaired glucose tolerance)   . Allergic rhinitis   . Depression   . Hyperlipidemia    Past Surgical History  Procedure Date  . Breast reduction surgery   . Tubal ligation   . Dilation and curettage of uterus 07/2010    Hysteroscopy NEG w/Dr Mezer   History  Substance Use Topics  . Smoking status: Not on file  . Smokeless tobacco: Not on file  . Alcohol Use:    Family History  Problem Relation Age of Onset  . Other Mother 25    MVA   . Hypertension Mother   . Hypertension Sister   . Hypertension Brother    No Known Allergies Current Outpatient Prescriptions on File Prior to Visit  Medication Sig Dispense Refill  . levothyroxine (LEVOTHROID) 25 MCG tablet Take 1 tablet (25 mcg total) by mouth daily.  90 tablet  3  . zolpidem (AMBIEN) 10 MG tablet Take 1 tablet (10 mg total) by mouth at bedtime as needed for sleep.  30 tablet  5     Review of Systems Review of Systems  Constitutional: Negative for fever, appetite change, fatigue and unexpected weight change.  Eyes: Negative for pain and visual  disturbance.  ENt pos for congestion and ear pain , neg for severe st  Respiratory: Negative for sob, pos for dry cough .   Cardiovascular: Negative for cp or palpitations    Gastrointestinal: Negative for nausea, diarrhea and constipation.  Genitourinary: Negative for urgency and frequency.  Skin: Negative for pallor or rash   Neurological: Negative for weakness, light-headedness, numbness and headaches.  Hematological: Negative for adenopathy. Does not bruise/bleed easily.  Psychiatric/Behavioral: Negative for dysphoric mood. The patient is not nervous/anxious.         Objective:   Physical Exam  Constitutional: She appears well-developed and well-nourished. No distress.  HENT:  Head: Normocephalic and atraumatic.  Left Ear: External ear normal.       R TM dull and erythematous and bulging  Mild maxillary sinus tenderness  Throat clear with post nasal drip Nares are congested bilat   Eyes: Conjunctivae and EOM are normal. Pupils are equal, round, and reactive to light. Right eye exhibits no discharge. Left eye exhibits no discharge.  Neck: Normal range of motion. Neck supple. No JVD present. No thyromegaly present.  Cardiovascular: Normal rate, regular rhythm and normal heart sounds.   Pulmonary/Chest: Effort normal and breath sounds normal.  No respiratory distress. She has no wheezes. She exhibits no tenderness.  Lymphadenopathy:    She has no cervical adenopathy.  Neurological: She is alert.  Skin: Skin is warm and dry. No rash noted. No erythema. No pallor.  Psychiatric: She has a normal mood and affect.          Assessment & Plan:

## 2011-10-01 NOTE — Assessment & Plan Note (Signed)
With uri and congestion and some fever Cover with zithromax sympt care - see inst  Fluids and rest  Update if worse or not imp

## 2011-10-03 ENCOUNTER — Telehealth: Payer: Self-pay

## 2011-10-03 NOTE — Telephone Encounter (Signed)
The patient called informing she saw Dr. Milinda Antis on Saturday and is not getting any better. She called to request a work note for Monday through Wednesday, return to work on Thursday. The patient also agreed to being seen as is getting worse and scheduled appointment for 10/04/2011.

## 2011-10-03 NOTE — Telephone Encounter (Signed)
Ok for note 

## 2011-10-03 NOTE — Telephone Encounter (Signed)
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

## 2011-10-04 ENCOUNTER — Ambulatory Visit: Payer: BC Managed Care – PPO | Admitting: Internal Medicine

## 2011-10-04 ENCOUNTER — Encounter: Payer: Self-pay | Admitting: Internal Medicine

## 2011-10-04 DIAGNOSIS — R7302 Impaired glucose tolerance (oral): Secondary | ICD-10-CM

## 2011-10-04 DIAGNOSIS — Z0001 Encounter for general adult medical examination with abnormal findings: Secondary | ICD-10-CM | POA: Insufficient documentation

## 2011-10-04 HISTORY — DX: Impaired glucose tolerance (oral): R73.02

## 2011-10-17 ENCOUNTER — Other Ambulatory Visit: Payer: Self-pay

## 2011-10-17 DIAGNOSIS — Z0000 Encounter for general adult medical examination without abnormal findings: Secondary | ICD-10-CM

## 2011-10-17 MED ORDER — SERTRALINE HCL 100 MG PO TABS: 100.0000 mg | ORAL_TABLET | Freq: Every day | ORAL | Status: DC

## 2011-10-17 NOTE — Telephone Encounter (Signed)
Pharmacy requesting refill on Sertraline 100 mg qd #90.

## 2011-10-17 NOTE — Telephone Encounter (Signed)
Called the patient left message on cell phone and home number of medication refill and  to schedule CPX in 3 month per MD request.

## 2011-10-17 NOTE — Telephone Encounter (Signed)
Ok for sertraline 100 qd  - sent to pharmacy per emr  Please CPX in 3 mo with labs prior

## 2011-11-01 ENCOUNTER — Other Ambulatory Visit: Payer: Self-pay | Admitting: Internal Medicine

## 2012-02-09 ENCOUNTER — Other Ambulatory Visit: Payer: Self-pay | Admitting: Neurosurgery

## 2012-02-09 DIAGNOSIS — D352 Benign neoplasm of pituitary gland: Secondary | ICD-10-CM

## 2012-02-13 ENCOUNTER — Telehealth: Payer: Self-pay

## 2012-02-13 NOTE — Telephone Encounter (Signed)
A user error has taken place: encounter opened in error, closed for administrative reasons.

## 2012-02-15 ENCOUNTER — Inpatient Hospital Stay: Admission: RE | Admit: 2012-02-15 | Payer: BC Managed Care – PPO | Source: Ambulatory Visit

## 2012-02-22 ENCOUNTER — Ambulatory Visit
Admission: RE | Admit: 2012-02-22 | Discharge: 2012-02-22 | Disposition: A | Discharge status: Home / Self Care | Payer: BC Managed Care – PPO | Source: Ambulatory Visit | Attending: Neurosurgery | Admitting: Neurosurgery

## 2012-02-22 DIAGNOSIS — D352 Benign neoplasm of pituitary gland: Secondary | ICD-10-CM

## 2012-02-22 DIAGNOSIS — D353 Benign neoplasm of craniopharyngeal duct: Secondary | ICD-10-CM

## 2012-02-22 MED ORDER — GADOBENATE DIMEGLUMINE 529 MG/ML IV SOLN
15.0000 mL | Freq: Once | INTRAVENOUS | Status: AC | PRN
  Administered 2012-02-22: 15 mL via INTRAVENOUS

## 2012-02-27 ENCOUNTER — Ambulatory Visit (INDEPENDENT_AMBULATORY_CARE_PROVIDER_SITE_OTHER): Payer: BC Managed Care – PPO | Admitting: Internal Medicine

## 2012-02-27 ENCOUNTER — Encounter: Payer: Self-pay | Admitting: Internal Medicine

## 2012-02-27 VITALS — BP 112/80 | HR 75 | Temp 98.0°F | Ht 64.0 in | Wt 310.4 lb

## 2012-02-27 DIAGNOSIS — F419 Anxiety disorder, unspecified: Secondary | ICD-10-CM | POA: Insufficient documentation

## 2012-02-27 DIAGNOSIS — D352 Benign neoplasm of pituitary gland: Secondary | ICD-10-CM

## 2012-02-27 DIAGNOSIS — R7302 Impaired glucose tolerance (oral): Secondary | ICD-10-CM

## 2012-02-27 DIAGNOSIS — I10 Essential (primary) hypertension: Secondary | ICD-10-CM

## 2012-02-27 DIAGNOSIS — R110 Nausea: Secondary | ICD-10-CM | POA: Insufficient documentation

## 2012-02-27 DIAGNOSIS — M25531 Pain in right wrist: Secondary | ICD-10-CM | POA: Insufficient documentation

## 2012-02-27 DIAGNOSIS — Z0000 Encounter for general adult medical examination without abnormal findings: Secondary | ICD-10-CM

## 2012-02-27 DIAGNOSIS — F411 Generalized anxiety disorder: Secondary | ICD-10-CM

## 2012-02-27 DIAGNOSIS — M25539 Pain in unspecified wrist: Secondary | ICD-10-CM

## 2012-02-27 HISTORY — DX: Anxiety disorder, unspecified: F41.9

## 2012-02-27 MED ORDER — NAPROXEN 500 MG PO TABS: 500.0000 mg | ORAL_TABLET | Freq: Two times a day (BID) | ORAL | Status: DC

## 2012-02-27 MED ORDER — HYDROCHLOROTHIAZIDE 12.5 MG PO CAPS: 12.5000 mg | ORAL_CAPSULE | Freq: Every day | ORAL | Status: DC

## 2012-02-27 MED ORDER — NADOLOL 40 MG PO TABS: 80.0000 mg | ORAL_TABLET | Freq: Every day | ORAL | Status: DC

## 2012-02-27 MED ORDER — SERTRALINE HCL 100 MG PO TABS: ORAL_TABLET | ORAL | Status: DC

## 2012-02-27 MED ORDER — PROMETHAZINE HCL 25 MG PO TABS: 25.0000 mg | ORAL_TABLET | Freq: Four times a day (QID) | ORAL | Status: DC | PRN

## 2012-02-27 MED ORDER — LEVOTHYROXINE SODIUM 25 MCG PO TABS: 25.0000 ug | ORAL_TABLET | Freq: Every day | ORAL | Status: DC

## 2012-02-27 NOTE — Assessment & Plan Note (Signed)
Unclear etiology, occurred with incrased anxiety with recent MRI, for phenergan prn, consider otc antacid, exam benign,  to f/u any worsening symptoms or concerns

## 2012-02-27 NOTE — Progress Notes (Signed)
Subjective:    Patient ID: Chelsea Thompson, female    DOB: 06-Jul-1971, 41 y.o.   MRN: [id number]  HPI  Here to f/u; had recent MRI and is curious about result, though she still plans to see Neurosurgury in f/u;  Had has dizziness, headaches, nausea, general weakness since then, cant stop thinking about whether the pituitary tumor has recurred.  Denies worsening depressive symptoms, suicidal ideation, or panic, though has ongoing anxiety, some increased recently also due to ongoing social factors as well.  Needs all med refills.  Also with pain and tenderness to the right lateral hand/wrist/arm, better with a wrist splint OTC.  Denies worsening reflux, dysphagia, abd pain, vomiting, bowel change or blood. Pt denies chest pain, increased sob or doe, wheezing, orthopnea, PND, increased LE swelling, palpitations, dizziness or syncope.  Pt denies new neurological symptoms such as new headache, or facial or extremity weakness or numbness   Pt denies polydipsia, polyuria,  Needs corzide changed - no longer available at pharmacy. Past Medical History  Diagnosis Date  . Pituitary adenoma   . Obesity   . Hypothyroidism     DR Lurene Shadow  . HTN (hypertension)   . Glucose intolerance (impaired glucose tolerance)   . Allergic rhinitis   . Depression   . Hyperlipidemia   . Impaired glucose tolerance 10/04/2011  . Anxiety 02/27/2012   Past Surgical History  Procedure Date  . Breast reduction surgery   . Tubal ligation   . Dilation and curettage of uterus 07/2010    Hysteroscopy NEG w/Dr Mezer    reports that she has never smoked. She does not have any smokeless tobacco history on file. She reports that she does not drink alcohol or use illicit drugs. family history includes Hypertension in her brother, mother, and sister and Other (age of onset:25) in her mother. No Known Allergies Current Outpatient Prescriptions on File Prior to Visit  Medication Sig Dispense Refill  . cabergoline (DOSTINEX) 0.5 MG  tablet Take 0.25 mg by mouth 2 (two) times a week.          Review of Systems Review of Systems  Constitutional: Negative for diaphoresis and unexpected weight change.  HENT: Negative for drooling and tinnitus.   Eyes: Negative for photophobia and visual disturbance.  Respiratory: Negative for choking and stridor.   Gastrointestinal: Negative for vomiting and blood in stool.  Genitourinary: Negative for hematuria and decreased urine volume.  Musculoskeletal: Negative for gait problem.    Objective:   Physical Exam BP 112/80  Pulse 75  Temp(Src) 98 F (36.7 C) (Oral)  Ht 5\' 4"  (1.626 m)  Wt 310 lb 6 oz (140.785 kg)  BMI 53.28 kg/m2  SpO2 97%  LMP 01/15/2012 Physical Exam  VS noted. Not ill appearin Constitutional: Pt appears well-developed and well-nourished. /morbid obese HENT: Head: Normocephalic.  Right Ear: External ear normal.  Left Ear: External ear normal.  Eyes: Conjunctivae and EOM are normal. Pupils are equal, round, and reactive to light.  Neck: Normal range of motion. Neck supple.  Cardiovascular: Normal rate and regular rhythm.   Pulmonary/Chest: Effort normal and breath sounds normal.  Abd:  Soft, NT, non-distended, + BS Neurological: Pt is alert. No cranial nerve deficit. motor/dtr/gait intact Skin: Skin is warm. No erythema.  Psychiatric: Pt behavior is normal. Thought content normal. 1-2+ anxious/tense demeanor Right hand with mod tenderness to the post hand involving the 5th extensor tendon area of the hand, wrist. Arm - o/w neurovasc intact    Assessment &  Plan:

## 2012-02-27 NOTE — Assessment & Plan Note (Signed)
Mod worse recent, for increased zoloft to 150 qd,  to f/u any worsening symptoms or concerns, declines counseling

## 2012-02-27 NOTE — Assessment & Plan Note (Signed)
stable overall by hx and exam, most recent data reviewed with pt, and pt to continue medical treatment as before  Lab Results  Component Value Date   HGBA1C 5.5 10/01/2009

## 2012-02-27 NOTE — Assessment & Plan Note (Signed)
To change corzide to corgard, and hctz,  to f/u any worsening symptoms or concerns BP Readings from Last 3 Encounters:  02/27/12 112/80  09/30/11 122/78  02/08/11 120/70

## 2012-02-27 NOTE — Assessment & Plan Note (Signed)
D/w pt, reassured, MRI wtihout apparent pit adenoma recurrence, to f/u with NS as well

## 2012-02-27 NOTE — Assessment & Plan Note (Signed)
C/w prob tendonitis vs DJD wrsit, refer hand surgury, naproxen prn,  to f/u any worsening symptoms or concerns

## 2012-02-27 NOTE — Patient Instructions (Addendum)
Take all new medications as prescribed - the phenergan, and the increased zoloft at 150 mg per day, and the naproxen as needed for pain Continue all other medications as before, except the corzide was changed to corgard and HCTZ (mild fluid pill) All medications wee sent to the pharmacy except for the corgard and the cabergoline You will be contacted regarding the referral for: hand surgury Please keep your appointments with your specialists as you have planned - Dr Lia Foyer are given the copy of your MRI today Please return in 3 mo with Lab testing done 3-5 days before

## 2012-03-06 ENCOUNTER — Other Ambulatory Visit: Payer: Self-pay

## 2012-03-06 MED ORDER — NADOLOL 40 MG PO TABS: 80.0000 mg | ORAL_TABLET | Freq: Every day | ORAL | Status: DC

## 2012-03-27 ENCOUNTER — Encounter: Payer: BC Managed Care – PPO | Admitting: Internal Medicine

## 2012-04-05 ENCOUNTER — Encounter: Payer: Self-pay | Admitting: Internal Medicine

## 2012-04-05 ENCOUNTER — Encounter: Payer: Self-pay | Admitting: Gastroenterology

## 2012-04-05 ENCOUNTER — Ambulatory Visit (INDEPENDENT_AMBULATORY_CARE_PROVIDER_SITE_OTHER): Payer: BC Managed Care – PPO | Admitting: Internal Medicine

## 2012-04-05 ENCOUNTER — Other Ambulatory Visit (INDEPENDENT_AMBULATORY_CARE_PROVIDER_SITE_OTHER): Payer: BC Managed Care – PPO

## 2012-04-05 VITALS — BP 138/87 | HR 97 | Temp 98.1°F | Ht 64.0 in | Wt 314.0 lb

## 2012-04-05 DIAGNOSIS — R197 Diarrhea, unspecified: Secondary | ICD-10-CM

## 2012-04-05 DIAGNOSIS — R109 Unspecified abdominal pain: Secondary | ICD-10-CM | POA: Insufficient documentation

## 2012-04-05 DIAGNOSIS — R10819 Abdominal tenderness, unspecified site: Secondary | ICD-10-CM

## 2012-04-05 DIAGNOSIS — J029 Acute pharyngitis, unspecified: Secondary | ICD-10-CM | POA: Insufficient documentation

## 2012-04-05 DIAGNOSIS — R112 Nausea with vomiting, unspecified: Secondary | ICD-10-CM

## 2012-04-05 DIAGNOSIS — R7309 Other abnormal glucose: Secondary | ICD-10-CM

## 2012-04-05 DIAGNOSIS — R7302 Impaired glucose tolerance (oral): Secondary | ICD-10-CM

## 2012-04-05 LAB — TSH: TSH: 0.93 u[IU]/mL (ref 0.35–5.50)

## 2012-04-05 LAB — URINALYSIS, ROUTINE W REFLEX MICROSCOPIC
Bilirubin Urine: NEGATIVE
Hgb urine dipstick: NEGATIVE
Ketones, ur: NEGATIVE
Leukocytes, UA: NEGATIVE
Nitrite: NEGATIVE
Specific Gravity, Urine: 1.01 (ref 1.000–1.030)
Total Protein, Urine: NEGATIVE
Urine Glucose: NEGATIVE
Urobilinogen, UA: 0.2 (ref 0.0–1.0)
pH: 7.5 (ref 5.0–8.0)

## 2012-04-05 LAB — CBC WITH DIFFERENTIAL/PLATELET
Basophils Absolute: 0 10*3/uL (ref 0.0–0.1)
Basophils Relative: 0.5 % (ref 0.0–3.0)
Eosinophils Absolute: 0.3 10*3/uL (ref 0.0–0.7)
Eosinophils Relative: 5.1 % — ABNORMAL HIGH (ref 0.0–5.0)
HCT: 37.8 % (ref 36.0–46.0)
Hemoglobin: 12.5 g/dL (ref 12.0–15.0)
Lymphocytes Relative: 15.5 % (ref 12.0–46.0)
Lymphs Abs: 1 10*3/uL (ref 0.7–4.0)
MCHC: 32.9 g/dL (ref 30.0–36.0)
MCV: 80.6 fl (ref 78.0–100.0)
Monocytes Absolute: 0.5 10*3/uL (ref 0.1–1.0)
Monocytes Relative: 7 % (ref 3.0–12.0)
Neutro Abs: 4.7 10*3/uL (ref 1.4–7.7)
Neutrophils Relative %: 71.9 % (ref 43.0–77.0)
Platelets: 282 10*3/uL (ref 150.0–400.0)
RBC: 4.69 Mil/uL (ref 3.87–5.11)
RDW: 14.3 % (ref 11.5–14.6)
WBC: 6.5 10*3/uL (ref 4.5–10.5)

## 2012-04-05 LAB — HEPATIC FUNCTION PANEL
ALT: 22 U/L (ref 0–35)
AST: 20 U/L (ref 0–37)
Albumin: 3.5 g/dL (ref 3.5–5.2)
Alkaline Phosphatase: 127 U/L — ABNORMAL HIGH (ref 39–117)
Bilirubin, Direct: 0 mg/dL (ref 0.0–0.3)
Total Bilirubin: 0.2 mg/dL — ABNORMAL LOW (ref 0.3–1.2)
Total Protein: 7.5 g/dL (ref 6.0–8.3)

## 2012-04-05 LAB — BASIC METABOLIC PANEL
BUN: 7 mg/dL (ref 6–23)
CO2: 29 mEq/L (ref 19–32)
Calcium: 8.9 mg/dL (ref 8.4–10.5)
Chloride: 102 mEq/L (ref 96–112)
Creatinine, Ser: 0.6 mg/dL (ref 0.4–1.2)
GFR: 137.45 mL/min (ref >60.00)
Glucose, Bld: 97 mg/dL (ref 70–99)
Potassium: 3.5 mEq/L (ref 3.5–5.1)
Sodium: 139 mEq/L (ref 135–145)

## 2012-04-05 LAB — LIPASE: Lipase: 15 U/L (ref 11.0–59.0)

## 2012-04-05 LAB — HEMOGLOBIN A1C: Hgb A1c MFr Bld: 5.5 % (ref 4.6–6.5)

## 2012-04-05 MED ORDER — DIPHENOXYLATE-ATROPINE 2.5-0.025 MG PO TABS: 1.0000 | ORAL_TABLET | Freq: Four times a day (QID) | ORAL | Status: AC | PRN

## 2012-04-05 MED ORDER — AZITHROMYCIN 250 MG PO TABS: ORAL_TABLET | ORAL | Status: DC

## 2012-04-05 MED ORDER — HYDROCODONE-HOMATROPINE 5-1.5 MG/5ML PO SYRP: 5.0000 mL | ORAL_SOLUTION | Freq: Four times a day (QID) | ORAL | Status: AC | PRN

## 2012-04-05 MED ORDER — PROMETHAZINE HCL 25 MG PO TABS: 25.0000 mg | ORAL_TABLET | Freq: Four times a day (QID) | ORAL | Status: DC | PRN

## 2012-04-05 MED ORDER — AZITHROMYCIN 250 MG PO TABS: ORAL_TABLET | ORAL | Status: AC

## 2012-04-05 NOTE — Patient Instructions (Addendum)
Take all new medications as prescribed - the antibiotic, cough medicine, phenergan for [place], and lomotil for diarrhea Continue all other medications as before You will be contacted regarding the referral for: GI referral Please go to LAB in the Basement for the blood and/or urine tests to be done today You will be contacted by phone if any changes need to be made immediately.  Otherwise, you will receive a letter about your results with an explanation. You are given the work note today

## 2012-04-05 NOTE — Assessment & Plan Note (Signed)
Acute x 2 days only, separate issue from subacute/chronic GI today  - for zpack x 1, cough med

## 2012-04-05 NOTE — Assessment & Plan Note (Addendum)
Unclear etiology ? Functional with episodes of incontinence, no blood or fever, for lomotil, and with all symtpoms for GI referral as well per pt request

## 2012-04-05 NOTE — Assessment & Plan Note (Signed)
Unclear signficance, for urine studies as well as labs

## 2012-04-05 NOTE — Assessment & Plan Note (Signed)
Unclear etiology though DM out of control is a differential consideration, for phenergan prn

## 2012-04-07 ENCOUNTER — Encounter: Payer: Self-pay | Admitting: Internal Medicine

## 2012-04-07 LAB — URINE CULTURE: Colony Count: 40000

## 2012-04-07 NOTE — Progress Notes (Signed)
Subjective:    Patient ID: Chelsea Thompson, female    DOB: 04-06-1971, 41 y.o.   MRN: [id number]  HPI   Here with 3 days acute onset fever, severe ST, pressure, general weakness and malaise, with occasional non prod cough, and Pt denies chest pain, increased sob or doe, wheezing, orthopnea, PND, increased LE swelling, palpitations, dizziness or syncope.  Pt denies new neurological symptoms such as new headache, or facial or extremity weakness or numbness   Pt denies polydipsia, polyuria. Incidentally also mentions 4 wks recurrent daily mld nausea with occasional vomiting and recurrent loose stools for 4 wks without fever, abd pain, chills, wt loss (in fact gained 4 lbs) or blood.  Sometimes with urgency and recurrent near daily bowel accidents/incontinence very distressing.  No recent med change or OTC med use.  Denies worsening depressive symptoms, suicidal ideation, or panic, though has ongoing anxiety. Past Medical History  Diagnosis Date  . Pituitary adenoma   . Obesity   . Hypothyroidism     DR Lurene Shadow  . HTN (hypertension)   . Glucose intolerance (impaired glucose tolerance)   . Allergic rhinitis   . Depression   . Hyperlipidemia   . Impaired glucose tolerance 10/04/2011  . Anxiety 02/27/2012   Past Surgical History  Procedure Date  . Breast reduction surgery   . Tubal ligation   . Dilation and curettage of uterus 07/2010    Hysteroscopy NEG w/Dr Mezer    reports that she has never smoked. She does not have any smokeless tobacco history on file. She reports that she does not drink alcohol or use illicit drugs. family history includes Hypertension in her brother, mother, and sister and Other (age of onset:25) in her mother. No Known Allergies Current Outpatient Prescriptions on File Prior to Visit  Medication Sig Dispense Refill  . cabergoline (DOSTINEX) 0.5 MG tablet Take 0.25 mg by mouth 2 (two) times a week.        . hydrochlorothiazide (MICROZIDE) 12.5 MG capsule Take 1  capsule (12.5 mg total) by mouth daily.  90 capsule  3  . levothyroxine (LEVOTHROID) 25 MCG tablet Take 1 tablet (25 mcg total) by mouth daily.  90 tablet  3  . nadolol (CORGARD) 40 MG tablet Take 2 tablets (80 mg total) by mouth daily.  180 tablet  3  . naproxen (NAPROSYN) 500 MG tablet Take 1 tablet (500 mg total) by mouth 2 (two) times daily with a meal.  60 tablet  2  . sertraline (ZOLOFT) 100 MG tablet 1 and 1/2 tabs per day  135 tablet  3  . promethazine (PHENERGAN) 25 MG tablet Take 1 tablet (25 mg total) by mouth every 6 (six) hours as needed for nausea.  30 tablet  0  . DISCONTD: fexofenadine (ALLEGRA) 180 MG tablet Take 180 mg by mouth daily.         Review of Systems Review of Systems  Constitutional: Negative for diaphoresis and unexpected weight change.  HENT: Negative for drooling and tinnitus.   Eyes: Negative for photophobia and visual disturbance.  Respiratory: Negative for choking and stridor.    Genitourinary: Negative for hematuria and decreased urine volume.  Musculoskeletal: Negative for gait problem.  Skin: Negative for color change and wound.  Neurological: Negative for tremors and numbness. .      Objective:   Physical Exam BP 138/87  Pulse 97  Temp(Src) 98.1 F (36.7 C) (Oral)  Ht 5\' 4"  (1.626 m)  Wt 314 lb (142.429 kg)  BMI  53.90 kg/m2  SpO2 93% Physical Exam  VS noted, mild ill Constitutional: Pt appears well-developed and well-nourished.  HENT: Head: Normocephalic.  Right Ear: External ear normal.  Left Ear: External ear normal.  Bilat tm's mild erythema.  Sinus nontender.  Pharynx severe erythema, slight exudate, 2+ uvula swelling Eyes: Conjunctivae and EOM are normal. Pupils are equal, round, and reactive to light.  Neck: Normal range of motion. Neck supple.  Cardiovascular: Normal rate and regular rhythm.   Pulmonary/Chest: Effort normal and breath sounds normal.  Abd:  Soft, NT, non-distended, + BS except for low mid/right lower quad tender mild  without guarding or rebound to deeper palpation Neurological: Pt is alert. Not confused Skin: Skin is warm. No erythema. no rash Psychiatric: Pt behavior is normal. Thought content normal. 1+ nervous, not depressed affect    Assessment & Plan:

## 2012-04-07 NOTE — Assessment & Plan Note (Signed)
stable overall by hx and exam, most recent data reviewed with pt, and pt to continue medical treatment as before Lab Results  Component Value Date   HGBA1C 5.5 04/05/2012

## 2012-04-08 ENCOUNTER — Encounter: Payer: Self-pay | Admitting: Internal Medicine

## 2012-04-25 ENCOUNTER — Ambulatory Visit: Payer: BC Managed Care – PPO | Admitting: Gastroenterology

## 2012-05-07 ENCOUNTER — Other Ambulatory Visit: Payer: Self-pay | Admitting: Internal Medicine

## 2012-05-09 ENCOUNTER — Telehealth: Payer: Self-pay | Admitting: Internal Medicine

## 2012-05-09 MED ORDER — HYDROCODONE-HOMATROPINE 5-1.5 MG/5ML PO SYRP: 5.0000 mL | ORAL_SOLUTION | Freq: Four times a day (QID) | ORAL | Status: AC | PRN

## 2012-05-09 NOTE — Telephone Encounter (Signed)
Done hardcopy to dahlia/LIM B  

## 2012-05-09 NOTE — Telephone Encounter (Signed)
Pt advised, Rx faxed to [facility] [address] per pt request.

## 2012-05-09 NOTE — Telephone Encounter (Signed)
Pt has an appt on 05/10/12 @ 1600 for a cough and sinus congestion. She requests an rx for a cough medication prior to her appt. Emergent sx r/o. Pls call.

## 2012-05-10 ENCOUNTER — Ambulatory Visit: Payer: BC Managed Care – PPO | Admitting: Internal Medicine

## 2012-05-10 DIAGNOSIS — Z0289 Encounter for other administrative examinations: Secondary | ICD-10-CM

## 2012-05-11 ENCOUNTER — Encounter: Payer: Self-pay | Admitting: *Deleted

## 2012-05-11 ENCOUNTER — Encounter: Payer: Self-pay | Admitting: Family Medicine

## 2012-05-11 ENCOUNTER — Ambulatory Visit (INDEPENDENT_AMBULATORY_CARE_PROVIDER_SITE_OTHER): Payer: BC Managed Care – PPO | Admitting: Family Medicine

## 2012-05-11 VITALS — BP 120/84 | HR 68 | Temp 97.5°F | Ht 64.0 in | Wt 319.0 lb

## 2012-05-11 DIAGNOSIS — J209 Acute bronchitis, unspecified: Secondary | ICD-10-CM

## 2012-05-11 MED ORDER — ALBUTEROL SULFATE HFA 108 (90 BASE) MCG/ACT IN AERS: 2.0000 | INHALATION_SPRAY | Freq: Four times a day (QID) | RESPIRATORY_TRACT | Status: DC | PRN

## 2012-05-11 MED ORDER — AZITHROMYCIN 250 MG PO TABS: ORAL_TABLET | ORAL | Status: AC

## 2012-05-11 NOTE — Progress Notes (Signed)
  Subjective:    Patient ID: Chelsea Thompson, female    DOB: Dec 07, 1971, 41 y.o.   MRN: [id number]  Cough This is a new problem. The current episode started in the past 7 days. The problem has been gradually worsening. Associated symptoms include chills, ear pain, a fever, headaches, myalgias, rhinorrhea and shortness of breath. Associated symptoms comments: Right ear, stiffness in neck Fever of 102. Risk factors: non smoker. She has tried OTC cough suppressant (tylenol, vicks, hycodan called in yesterday...helped minimally ) for the symptoms. The treatment provided no relief. Her past medical history is significant for environmental allergies. There is no history of asthma, COPD or pneumonia.      Review of Systems  Constitutional: Positive for fever and chills.  HENT: Positive for ear pain and rhinorrhea.   Respiratory: Positive for cough and shortness of breath.   Musculoskeletal: Positive for myalgias.  Neurological: Positive for headaches.  Hematological: Positive for environmental allergies.       Objective:   Physical Exam  Constitutional: Vital signs are normal. She appears well-developed and well-nourished. She is cooperative.  Non-toxic appearance. She does not appear ill. No distress.       Obese, constant coughing, productive.  HENT:  Head: Normocephalic.  Right Ear: Hearing, tympanic membrane, external ear and ear canal normal. Tympanic membrane is not erythematous, not retracted and not bulging.  Left Ear: Hearing, tympanic membrane, external ear and ear canal normal. Tympanic membrane is not erythematous, not retracted and not bulging.  Nose: Mucosal edema and rhinorrhea present. Right sinus exhibits no maxillary sinus tenderness and no frontal sinus tenderness. Left sinus exhibits no maxillary sinus tenderness and no frontal sinus tenderness.  Mouth/Throat: Uvula is midline, oropharynx is clear and moist and mucous membranes are normal.  Eyes: Conjunctivae, EOM and lids  are normal. Pupils are equal, round, and reactive to light. No foreign bodies found.  Neck: Trachea normal and normal range of motion. Neck supple. Carotid bruit is not present. No mass and no thyromegaly present.  Cardiovascular: Normal rate, regular rhythm, S1 normal, S2 normal, normal heart sounds, intact distal pulses and normal pulses.  Exam reveals no gallop and no friction rub.   No murmur heard. Pulmonary/Chest: Effort normal and breath sounds normal. Not tachypneic. No respiratory distress. She has no decreased breath sounds. She has no wheezes. She has no rhonchi. She has no rales.  Neurological: She is alert.  Skin: Skin is warm, dry and intact. No rash noted.  Psychiatric: Her speech is normal and behavior is normal. Judgment normal. Her mood appears not anxious. Cognition and memory are normal. She does not exhibit a depressed mood.          Assessment & Plan:

## 2012-05-11 NOTE — Patient Instructions (Signed)
Start antibiotic, complete course.  Push fluids. Tylenol for body ache.  Hycodan for cough.  May try inhaler for coughing spells. Call if no timproving as expected.

## 2012-05-11 NOTE — Assessment & Plan Note (Signed)
Treat wit antibiotics given >7 days illness, worsening. Given SOB and wheeze can use inhaler.  otherwise symptomatic care.

## 2012-05-17 ENCOUNTER — Encounter: Payer: BC Managed Care – PPO | Admitting: Internal Medicine

## 2012-06-14 ENCOUNTER — Encounter (HOSPITAL_COMMUNITY): Payer: Self-pay | Admitting: Emergency Medicine

## 2012-06-14 ENCOUNTER — Emergency Department (HOSPITAL_COMMUNITY)
Admission: EM | Admit: 2012-06-14 | Discharge: 2012-06-14 | Disposition: A | Discharge status: Home / Self Care | Payer: BC Managed Care – PPO | Attending: Emergency Medicine | Admitting: Emergency Medicine

## 2012-06-14 DIAGNOSIS — I10 Essential (primary) hypertension: Secondary | ICD-10-CM | POA: Insufficient documentation

## 2012-06-14 DIAGNOSIS — G43909 Migraine, unspecified, not intractable, without status migrainosus: Secondary | ICD-10-CM | POA: Insufficient documentation

## 2012-06-14 DIAGNOSIS — E039 Hypothyroidism, unspecified: Secondary | ICD-10-CM | POA: Insufficient documentation

## 2012-06-14 DIAGNOSIS — E669 Obesity, unspecified: Secondary | ICD-10-CM | POA: Insufficient documentation

## 2012-06-14 DIAGNOSIS — F341 Dysthymic disorder: Secondary | ICD-10-CM | POA: Insufficient documentation

## 2012-06-14 LAB — URINE MICROSCOPIC-ADD ON

## 2012-06-14 LAB — URINALYSIS, ROUTINE W REFLEX MICROSCOPIC
Bilirubin Urine: NEGATIVE
Glucose, UA: NEGATIVE mg/dL
Hgb urine dipstick: NEGATIVE
Ketones, ur: NEGATIVE mg/dL
Nitrite: NEGATIVE
Protein, ur: NEGATIVE mg/dL
Specific Gravity, Urine: 1.011 (ref 1.005–1.030)
Urobilinogen, UA: 0.2 mg/dL (ref 0.0–1.0)
pH: 6 (ref 5.0–8.0)

## 2012-06-14 LAB — POCT PREGNANCY, URINE: Preg Test, Ur: NEGATIVE

## 2012-06-14 MED ORDER — SODIUM CHLORIDE 0.9 % IV BOLUS (SEPSIS)
1000.0000 mL | Freq: Once | INTRAVENOUS | Status: AC
  Administered 2012-06-14: 1000 mL via INTRAVENOUS

## 2012-06-14 MED ORDER — DEXAMETHASONE SODIUM PHOSPHATE 10 MG/ML IJ SOLN
10.0000 mg | Freq: Once | INTRAMUSCULAR | Status: AC
  Administered 2012-06-14: 10 mg via INTRAVENOUS
  Filled 2012-06-14: qty 1

## 2012-06-14 MED ORDER — DIPHENHYDRAMINE HCL 50 MG/ML IJ SOLN
25.0000 mg | Freq: Once | INTRAMUSCULAR | Status: AC
  Administered 2012-06-14: 25 mg via INTRAVENOUS
  Filled 2012-06-14: qty 1

## 2012-06-14 MED ORDER — METOCLOPRAMIDE HCL 5 MG/ML IJ SOLN
10.0000 mg | Freq: Once | INTRAMUSCULAR | Status: AC
  Administered 2012-06-14: 10 mg via INTRAVENOUS
  Filled 2012-06-14: qty 2

## 2012-06-14 MED ORDER — KETOROLAC TROMETHAMINE 30 MG/ML IJ SOLN
30.0000 mg | Freq: Once | INTRAMUSCULAR | Status: AC
  Administered 2012-06-14: 30 mg via INTRAVENOUS
  Filled 2012-06-14: qty 1

## 2012-06-14 NOTE — ED Notes (Signed)
C/o intermittent headache x 2 weeks with nausea and vomiting.

## 2012-06-14 NOTE — ED Provider Notes (Signed)
History     CSN: [id number]  Arrival date & time 06/14/12  1914   First MD Initiated Contact with Patient 06/14/12 2146      Chief Complaint  Patient presents with  . Headache    (Consider location/radiation/quality/duration/timing/severity/associated sxs/prior treatment) Patient is a 41 y.o. female presenting with headaches. The history is provided by the patient.  Headache  This is a new problem. The current episode started more than 1 week ago. The problem occurs every few hours. The problem has not changed since onset.The headache is associated with bright light. Pain location: pain moves to different spots. The quality of the pain is described as throbbing. The pain is mild. The pain does not radiate. Associated symptoms include nausea. Pertinent negatives include no fever, no palpitations, no shortness of breath and no vomiting. She has tried nothing for the symptoms. The treatment provided no relief.    Past Medical History  Diagnosis Date  . Pituitary adenoma   . Obesity   . Hypothyroidism     DR Lurene Shadow  . HTN (hypertension)   . Glucose intolerance (impaired glucose tolerance)   . Allergic rhinitis   . Depression   . Hyperlipidemia   . Impaired glucose tolerance 10/04/2011  . Anxiety 02/27/2012    Past Surgical History  Procedure Date  . Breast reduction surgery   . Tubal ligation   . Dilation and curettage of uterus 07/2010    Hysteroscopy NEG w/Dr Mezer    Family History  Problem Relation Age of Onset  . Other Mother 25    MVA   . Hypertension Mother   . Hypertension Sister   . Hypertension Brother     History  Substance Use Topics  . Smoking status: Never Smoker   . Smokeless tobacco: Not on file  . Alcohol Use: No    OB History    Grav Para Term Preterm Abortions TAB SAB Ect Mult Living                  Review of Systems  Constitutional: Negative for fever, chills, diaphoresis and fatigue.  HENT: Negative for ear pain, congestion, sore  throat, facial swelling, mouth sores, trouble swallowing, neck pain and neck stiffness.   Eyes: Negative.  Negative for visual disturbance.  Respiratory: Negative for apnea, cough, chest tightness, shortness of breath and wheezing.   Cardiovascular: Negative for chest pain, palpitations and leg swelling.  Gastrointestinal: Positive for nausea. Negative for vomiting, abdominal pain, diarrhea and abdominal distention.  Genitourinary: Negative for hematuria, flank pain, vaginal discharge, difficulty urinating and menstrual problem.  Musculoskeletal: Negative for back pain and gait problem.  Skin: Negative for rash and wound.  Neurological: Positive for headaches. Negative for dizziness, tremors, seizures, syncope, facial asymmetry and numbness.  Psychiatric/Behavioral: Negative.   All other systems reviewed and are negative.    Allergies  Review of patient's allergies indicates no known allergies.  Home Medications   Current Outpatient Rx  Name Route Sig Dispense Refill  . CABERGOLINE 0.5 MG PO TABS Oral Take 0.25 mg by mouth 2 (two) times a week.      Marland Kitchen HYDROCHLOROTHIAZIDE 12.5 MG PO CAPS Oral Take 1 capsule (12.5 mg total) by mouth daily. 90 capsule 3  . LEVOTHYROXINE SODIUM 25 MCG PO TABS Oral Take 1 tablet (25 mcg total) by mouth daily. 90 tablet 3  . ADULT MULTIVITAMIN W/MINERALS CH Oral Take 1 tablet by mouth daily.      BP 161/84  Pulse 65  Temp 98.2 F (36.8 C) (Oral)  Resp 20  SpO2 100%  LMP 04/29/2012  Physical Exam  Nursing note and vitals reviewed. Constitutional: She is oriented to person, place, and time. She appears well-developed and well-nourished. No distress.  HENT:  Head: Normocephalic and atraumatic.  Right Ear: External ear normal.  Left Ear: External ear normal.  Nose: Nose normal.  Mouth/Throat: Oropharynx is clear and moist. No oropharyngeal exudate.  Eyes: Conjunctivae and EOM are normal. Pupils are equal, round, and reactive to light. Right eye  exhibits no discharge. Left eye exhibits no discharge.  Neck: Normal range of motion. Neck supple. No JVD present. No tracheal deviation present. No thyromegaly present.  Cardiovascular: Normal rate, regular rhythm, normal heart sounds and intact distal pulses.  Exam reveals no gallop and no friction rub.   No murmur heard. Pulmonary/Chest: Effort normal and breath sounds normal. No respiratory distress. She has no wheezes. She has no rales. She exhibits no tenderness.  Abdominal: Soft. Bowel sounds are normal. She exhibits no distension. There is no tenderness. There is no rebound and no guarding.  Musculoskeletal: Normal range of motion.  Lymphadenopathy:    She has no cervical adenopathy.  Neurological: She is alert and oriented to person, place, and time. No cranial nerve deficit or sensory deficit. Coordination normal. GCS eye subscore is 4. GCS verbal subscore is 5. GCS motor subscore is 6.  Skin: Skin is warm. No rash noted. She is not diaphoretic.  Psychiatric: She has a normal mood and affect. Her behavior is normal. Judgment and thought content normal.    ED Course  Procedures (including critical care time)  Labs Reviewed  URINALYSIS, ROUTINE W REFLEX MICROSCOPIC - Abnormal; Notable for the following:    Leukocytes, UA MODERATE (*)     All other components within normal limits  URINE MICROSCOPIC-ADD ON - Abnormal; Notable for the following:    Squamous Epithelial / LPF FEW (*)     All other components within normal limits  POCT PREGNANCY, URINE   No results found.   No diagnosis found.    MDM  41 year old female patient presents with headaches. Patient was asked history of hypertension hypothyroidism depression hyperlipidemia anxiety and is morbidly obese. Patient says headaches are gone on for 2 weeks it comes and goes is of moderate severity is worse with light. It is not thunderclap in nature it is not intense and has not occurred suddenly. Patient without fevers  patient does endorse some nausea and episodes of dry heaving at times. Patient tolerating by mouth is normal stooling and urine. No chest pain shortness of breath or abdominal pain. Patient with no neck pain or neck stiffness. Patient without any rashes. On my exam patient was able to move head and neck without difficulty. Patient had no meningismus. Not concerned about infectious etiologies given the chronicity of the pain and the lack of meningismus and lack of fevers. Doubt bleed and given the characterization of the pain. Concern for possible tension headache or migraine headache. Patient without papilledema or visual changes so pseudotumor cerebri seems less likely.  Results for orders placed during the hospital encounter of 06/14/12  URINALYSIS, ROUTINE W REFLEX MICROSCOPIC      Component Value Range   Color, Urine YELLOW  YELLOW   APPearance CLEAR  CLEAR   Specific Gravity, Urine 1.011  1.005 - 1.030   pH 6.0  5.0 - 8.0   Glucose, UA NEGATIVE  NEGATIVE mg/dL   Hgb urine dipstick NEGATIVE  NEGATIVE   Bilirubin Urine NEGATIVE  NEGATIVE   Ketones, ur NEGATIVE  NEGATIVE mg/dL   Protein, ur NEGATIVE  NEGATIVE mg/dL   Urobilinogen, UA 0.2  0.0 - 1.0 mg/dL   Nitrite NEGATIVE  NEGATIVE   Leukocytes, UA MODERATE (*) NEGATIVE  POCT PREGNANCY, URINE      Component Value Range   Preg Test, Ur NEGATIVE  NEGATIVE  URINE MICROSCOPIC-ADD ON      Component Value Range   Squamous Epithelial / LPF FEW (*) RARE   WBC, UA 7-10  <3 WBC/hpf   Bacteria, UA RARE  RARE     On reevaluation the patient's symptoms completely subsided with headache cocktail. Patient instructed to followup with PCP or neurologist for headaches continue for long-term treatment of possible migraine headaches.  Case discussed with Dr. Burnett Corrente, MD 06/14/12 2330

## 2012-06-14 NOTE — ED Notes (Signed)
Pt stated that she has been having intermittent HA x 2 weeks. Pain normally starts in the back of head and radiates to other areas in head. Pt has light sensitivity and sound sensitivity. Headaches also make her nauseated. No vomiting. Pt has hx of HTN but not migraines. Pt stated she has not had her menstrual cycle for 23 days and wonders if she could be pregnant. Will continue to monitor.

## 2012-06-14 NOTE — ED Provider Notes (Signed)
I saw and evaluated the patient, reviewed the resident's note and I agree with the findings and plan.  Gradual onset waxing and waning headache over 2 weeks is no sudden onset no focal neurologic symptoms no fever no trauma and eye grounds appear to have sharp disc margins with no hemorrhages noted.  Hurman Horn, MD 06/15/12 [phone]

## 2012-06-14 NOTE — ED Notes (Signed)
Pt reports period is 18 days late.

## 2012-06-20 ENCOUNTER — Encounter: Payer: BC Managed Care – PPO | Admitting: Internal Medicine

## 2012-06-20 DIAGNOSIS — Z0289 Encounter for other administrative examinations: Secondary | ICD-10-CM

## 2012-06-25 ENCOUNTER — Ambulatory Visit: Payer: BC Managed Care – PPO | Admitting: Internal Medicine

## 2012-07-01 ENCOUNTER — Encounter (HOSPITAL_COMMUNITY): Payer: Self-pay | Admitting: Emergency Medicine

## 2012-07-01 ENCOUNTER — Observation Stay (HOSPITAL_COMMUNITY): Payer: BC Managed Care – PPO

## 2012-07-01 ENCOUNTER — Observation Stay (HOSPITAL_COMMUNITY)
Admission: EM | Admit: 2012-07-01 | Discharge: 2012-07-02 | Disposition: A | Discharge status: Home / Self Care | Payer: BC Managed Care – PPO | Attending: Internal Medicine | Admitting: Internal Medicine

## 2012-07-01 ENCOUNTER — Emergency Department (HOSPITAL_COMMUNITY): Payer: BC Managed Care – PPO

## 2012-07-01 ENCOUNTER — Other Ambulatory Visit: Payer: Self-pay

## 2012-07-01 DIAGNOSIS — Z0000 Encounter for general adult medical examination without abnormal findings: Secondary | ICD-10-CM

## 2012-07-01 DIAGNOSIS — E039 Hypothyroidism, unspecified: Secondary | ICD-10-CM | POA: Diagnosis present

## 2012-07-01 DIAGNOSIS — M25531 Pain in right wrist: Secondary | ICD-10-CM

## 2012-07-01 DIAGNOSIS — E669 Obesity, unspecified: Secondary | ICD-10-CM

## 2012-07-01 DIAGNOSIS — R110 Nausea: Secondary | ICD-10-CM

## 2012-07-01 DIAGNOSIS — R079 Chest pain, unspecified: Secondary | ICD-10-CM | POA: Diagnosis present

## 2012-07-01 DIAGNOSIS — R7309 Other abnormal glucose: Secondary | ICD-10-CM | POA: Insufficient documentation

## 2012-07-01 DIAGNOSIS — J309 Allergic rhinitis, unspecified: Secondary | ICD-10-CM

## 2012-07-01 DIAGNOSIS — D352 Benign neoplasm of pituitary gland: Secondary | ICD-10-CM | POA: Diagnosis present

## 2012-07-01 DIAGNOSIS — F419 Anxiety disorder, unspecified: Secondary | ICD-10-CM

## 2012-07-01 DIAGNOSIS — F411 Generalized anxiety disorder: Secondary | ICD-10-CM | POA: Insufficient documentation

## 2012-07-01 DIAGNOSIS — R0602 Shortness of breath: Secondary | ICD-10-CM

## 2012-07-01 DIAGNOSIS — I10 Essential (primary) hypertension: Secondary | ICD-10-CM | POA: Diagnosis present

## 2012-07-01 DIAGNOSIS — F3289 Other specified depressive episodes: Secondary | ICD-10-CM

## 2012-07-01 DIAGNOSIS — R0789 Other chest pain: Principal | ICD-10-CM | POA: Insufficient documentation

## 2012-07-01 DIAGNOSIS — D353 Benign neoplasm of craniopharyngeal duct: Secondary | ICD-10-CM

## 2012-07-01 DIAGNOSIS — R10819 Abdominal tenderness, unspecified site: Secondary | ICD-10-CM

## 2012-07-01 DIAGNOSIS — E785 Hyperlipidemia, unspecified: Secondary | ICD-10-CM

## 2012-07-01 DIAGNOSIS — F329 Major depressive disorder, single episode, unspecified: Secondary | ICD-10-CM

## 2012-07-01 DIAGNOSIS — R7302 Impaired glucose tolerance (oral): Secondary | ICD-10-CM

## 2012-07-01 LAB — TROPONIN I: Troponin I: <0.3 ng/mL (ref <0.30)

## 2012-07-01 LAB — URINALYSIS, ROUTINE W REFLEX MICROSCOPIC
Bilirubin Urine: NEGATIVE
Glucose, UA: NEGATIVE mg/dL
Ketones, ur: NEGATIVE mg/dL
Nitrite: NEGATIVE
Protein, ur: NEGATIVE mg/dL
Specific Gravity, Urine: 1.01 (ref 1.005–1.030)
Urobilinogen, UA: 0.2 mg/dL (ref 0.0–1.0)
pH: 6 (ref 5.0–8.0)

## 2012-07-01 LAB — BASIC METABOLIC PANEL
BUN: 7 mg/dL (ref 6–23)
CO2: 30 mEq/L (ref 19–32)
Calcium: 9.7 mg/dL (ref 8.4–10.5)
Chloride: 101 mEq/L (ref 96–112)
Creatinine, Ser: 0.59 mg/dL (ref 0.50–1.10)
GFR calc Af Amer: >90 mL/min (ref >90)
GFR calc non Af Amer: >90 mL/min (ref >90)
Glucose, Bld: 66 mg/dL — ABNORMAL LOW (ref 70–99)
Potassium: 3.9 mEq/L (ref 3.5–5.1)
Sodium: 138 mEq/L (ref 135–145)

## 2012-07-01 LAB — CBC
HCT: 39.5 % (ref 36.0–46.0)
Hemoglobin: 12.9 g/dL (ref 12.0–15.0)
MCH: 26.2 pg (ref 26.0–34.0)
MCHC: 32.7 g/dL (ref 30.0–36.0)
MCV: 80.3 fL (ref 78.0–100.0)
Platelets: 335 10*3/uL (ref 150–400)
RBC: 4.92 MIL/uL (ref 3.87–5.11)
RDW: 14.3 % (ref 11.5–15.5)
WBC: 7.2 10*3/uL (ref 4.0–10.5)

## 2012-07-01 LAB — POCT I-STAT TROPONIN I: Troponin i, poc: 0 ng/mL (ref 0.00–0.08)

## 2012-07-01 LAB — URINE MICROSCOPIC-ADD ON

## 2012-07-01 LAB — D-DIMER, QUANTITATIVE: D-Dimer, Quant: 0.43 ug/mL-FEU (ref 0.00–0.48)

## 2012-07-01 MED ORDER — ASPIRIN 81 MG PO CHEW
324.0000 mg | CHEWABLE_TABLET | Freq: Once | ORAL | Status: AC
  Administered 2012-07-01: 324 mg via ORAL
  Filled 2012-07-01: qty 4

## 2012-07-01 MED ORDER — HYDROCHLOROTHIAZIDE 12.5 MG PO CAPS
12.5000 mg | ORAL_CAPSULE | Freq: Every day | ORAL | Status: DC
  Administered 2012-07-02: 12.5 mg via ORAL
  Filled 2012-07-01: qty 1

## 2012-07-01 MED ORDER — ASPIRIN 325 MG PO TABS: 325.0000 mg | ORAL_TABLET | ORAL | Status: DC

## 2012-07-01 MED ORDER — ACETAMINOPHEN 650 MG RE SUPP: 650.0000 mg | Freq: Four times a day (QID) | RECTAL | Status: DC | PRN

## 2012-07-01 MED ORDER — ONDANSETRON HCL 4 MG/2ML IJ SOLN
4.0000 mg | Freq: Four times a day (QID) | INTRAMUSCULAR | Status: DC | PRN
  Administered 2012-07-02: 4 mg via INTRAVENOUS
  Filled 2012-07-01: qty 2

## 2012-07-01 MED ORDER — OXYCODONE-ACETAMINOPHEN 5-325 MG PO TABS
1.0000 | ORAL_TABLET | Freq: Once | ORAL | Status: AC
  Administered 2012-07-01: 1 via ORAL
  Filled 2012-07-01: qty 1

## 2012-07-01 MED ORDER — ONDANSETRON HCL 4 MG PO TABS: 4.0000 mg | ORAL_TABLET | Freq: Four times a day (QID) | ORAL | Status: DC | PRN

## 2012-07-01 MED ORDER — MORPHINE SULFATE 4 MG/ML IJ SOLN
1.0000 mg | INTRAMUSCULAR | Status: DC | PRN
  Administered 2012-07-01: 1 mg via INTRAVENOUS
  Filled 2012-07-01: qty 1

## 2012-07-01 MED ORDER — PROMETHAZINE HCL 25 MG/ML IJ SOLN
25.0000 mg | Freq: Once | INTRAMUSCULAR | Status: AC
  Administered 2012-07-01: 25 mg via INTRAVENOUS
  Filled 2012-07-01: qty 1

## 2012-07-01 MED ORDER — ADULT MULTIVITAMIN W/MINERALS CH
1.0000 | ORAL_TABLET | Freq: Every day | ORAL | Status: DC
  Administered 2012-07-02: 1 via ORAL
  Filled 2012-07-01: qty 1

## 2012-07-01 MED ORDER — LEVOTHYROXINE SODIUM 25 MCG PO TABS
25.0000 ug | ORAL_TABLET | Freq: Every day | ORAL | Status: DC
  Administered 2012-07-02: 25 ug via ORAL
  Filled 2012-07-01: qty 1

## 2012-07-01 MED ORDER — MORPHINE SULFATE 4 MG/ML IJ SOLN
4.0000 mg | Freq: Once | INTRAMUSCULAR | Status: AC
  Administered 2012-07-01: 4 mg via INTRAVENOUS
  Filled 2012-07-01: qty 1

## 2012-07-01 MED ORDER — ACETAMINOPHEN 325 MG PO TABS
650.0000 mg | ORAL_TABLET | Freq: Four times a day (QID) | ORAL | Status: DC | PRN
  Administered 2012-07-02 (×2): 650 mg via ORAL
  Filled 2012-07-01 (×2): qty 2

## 2012-07-01 MED ORDER — ASPIRIN EC 325 MG PO TBEC
325.0000 mg | DELAYED_RELEASE_TABLET | Freq: Every day | ORAL | Status: DC
  Administered 2012-07-02: 325 mg via ORAL
  Filled 2012-07-01: qty 1

## 2012-07-01 MED ORDER — ENOXAPARIN SODIUM 40 MG/0.4ML ~~LOC~~ SOLN
40.0000 mg | SUBCUTANEOUS | Status: DC
  Administered 2012-07-02: 40 mg via SUBCUTANEOUS
  Filled 2012-07-01: qty 0.4

## 2012-07-01 MED ORDER — CABERGOLINE 0.5 MG PO TABS
0.5000 mg | ORAL_TABLET | ORAL | Status: DC
  Administered 2012-07-02: 0.5 mg via ORAL
  Filled 2012-07-01: qty 1

## 2012-07-01 MED ORDER — IOHEXOL 350 MG/ML SOLN
100.0000 mL | Freq: Once | INTRAVENOUS | Status: AC | PRN
  Administered 2012-07-01: 100 mL via INTRAVENOUS

## 2012-07-01 MED ORDER — NITROGLYCERIN 0.4 MG SL SUBL
0.4000 mg | SUBLINGUAL_TABLET | SUBLINGUAL | Status: AC | PRN
  Administered 2012-07-01 (×3): 0.4 mg via SUBLINGUAL
  Filled 2012-07-01: qty 75

## 2012-07-01 MED ORDER — SODIUM CHLORIDE 0.9 % IJ SOLN
3.0000 mL | Freq: Two times a day (BID) | INTRAMUSCULAR | Status: DC
  Administered 2012-07-02 (×2): 3 mL via INTRAVENOUS

## 2012-07-01 MED ORDER — NITROGLYCERIN 0.4 MG SL SUBL: 0.4000 mg | SUBLINGUAL_TABLET | SUBLINGUAL | Status: DC | PRN

## 2012-07-01 NOTE — ED Notes (Signed)
Pt reports right sided chest pain with ambulation and deep inspiration since this approx 11am. Pt also reports right sided headache and is squinting with right eye shut during exam.

## 2012-07-01 NOTE — ED Provider Notes (Signed)
History     CSN: [id number]  Arrival date & time 07/01/12  1320   First MD Initiated Contact with Patient 07/01/12 1721      Chief Complaint  Patient presents with  . Chest Pain    (Consider location/radiation/quality/duration/timing/severity/associated sxs/prior treatment) Patient is a 41 y.o. female presenting with chest pain. The history is provided by the patient.  Chest Pain Pertinent negatives for primary symptoms include no shortness of breath, no abdominal pain, no nausea and no vomiting.  Pertinent negatives for associated symptoms include no numbness and no weakness.    patient developed sharp right-sided chest pain prior to arrival. It is worse with movement or palpation. No trauma. She states she had pain about 10 years ago and was gas. She does not smoke. She has no known cardiac disease. No recent travel. No fevers. No fall. Past Medical History  Diagnosis Date  . Pituitary adenoma   . Obesity   . Hypothyroidism     DR Lurene Shadow  . HTN (hypertension)   . Glucose intolerance (impaired glucose tolerance)   . Allergic rhinitis   . Depression   . Hyperlipidemia   . Impaired glucose tolerance 10/04/2011  . Anxiety 02/27/2012    Past Surgical History  Procedure Date  . Breast reduction surgery   . Tubal ligation   . Dilation and curettage of uterus 07/2010    Hysteroscopy NEG w/Dr Mezer    Family History  Problem Relation Age of Onset  . Other Mother 25    MVA   . Hypertension Mother   . Hypertension Sister   . Hypertension Brother     History  Substance Use Topics  . Smoking status: Never Smoker   . Smokeless tobacco: Not on file  . Alcohol Use: No    OB History    Grav Para Term Preterm Abortions TAB SAB Ect Mult Living                  Review of Systems  Constitutional: Negative for activity change and appetite change.  HENT: Negative for neck stiffness.   Eyes: Negative for pain.  Respiratory: Negative for chest tightness and shortness of  breath.   Cardiovascular: Positive for chest pain. Negative for leg swelling.  Gastrointestinal: Negative for nausea, vomiting, abdominal pain and diarrhea.  Genitourinary: Negative for flank pain.  Musculoskeletal: Negative for back pain.  Skin: Negative for rash.  Neurological: Negative for weakness, numbness and headaches.  Psychiatric/Behavioral: Negative for behavioral problems.    Allergies  Review of patient's allergies indicates no known allergies.  Home Medications   Current Outpatient Rx  Name Route Sig Dispense Refill  . CABERGOLINE 0.5 MG PO TABS Oral Take 0.5 mg by mouth 2 (two) times a week.     Marland Kitchen HYDROCHLOROTHIAZIDE 12.5 MG PO CAPS Oral Take 1 capsule (12.5 mg total) by mouth daily. 90 capsule 3  . LEVOTHYROXINE SODIUM 25 MCG PO TABS Oral Take 1 tablet (25 mcg total) by mouth daily. 90 tablet 3  . ADULT MULTIVITAMIN W/MINERALS CH Oral Take 1 tablet by mouth daily.      BP 155/80  Pulse 69  Temp 98.2 F (36.8 C) (Oral)  Resp 23  SpO2 98%  LMP 04/29/2012  Physical Exam  Nursing note and vitals reviewed. Constitutional: She is oriented to person, place, and time. She appears well-developed and well-nourished.  HENT:  Head: Normocephalic and atraumatic.  Eyes: EOM are normal. Pupils are equal, round, and reactive to light.  Neck:  Normal range of motion. Neck supple.  Cardiovascular: Normal rate, regular rhythm and normal heart sounds.   No murmur heard. Pulmonary/Chest: Effort normal and breath sounds normal. No respiratory distress. She has no wheezes. She has no rales. She exhibits tenderness.       Tenderness to right parasternal area. No rash.  Abdominal: Soft. Bowel sounds are normal. She exhibits no distension. There is no tenderness. There is no rebound and no guarding.  Musculoskeletal: Normal range of motion.  Neurological: She is alert and oriented to person, place, and time. No cranial nerve deficit.  Skin: Skin is warm and dry.  Psychiatric: She  has a normal mood and affect. Her speech is normal.    ED Course  Procedures (including critical care time)  Labs Reviewed  BASIC METABOLIC PANEL - Abnormal; Notable for the following:    Glucose, Bld 66 (*)     All other components within normal limits  URINALYSIS, ROUTINE W REFLEX MICROSCOPIC - Abnormal; Notable for the following:    APPearance CLOUDY (*)     Hgb urine dipstick LARGE (*)     Leukocytes, UA MODERATE (*)     All other components within normal limits  URINE MICROSCOPIC-ADD ON - Abnormal; Notable for the following:    Squamous Epithelial / LPF FEW (*)     All other components within normal limits  CBC  POCT I-STAT TROPONIN I  D-DIMER, QUANTITATIVE  TROPONIN I  MRSA PCR SCREENING   Dg Chest 2 View  07/01/2012  *RADIOLOGY REPORT*  Clinical Data: Right chest pain.  CHEST - 2 VIEW  Comparison: 09/23/2008  Findings: Cardiac and mediastinal contours appear normal.  The lungs appear clear.  No pleural effusion is identified.  Minimal thoracic spondylosis noted.  IMPRESSION:  1.  No significant abnormality identified.  Original Report Authenticated By: Dellia Cloud, M.D.     1. Chest pain   2. Benign neoplasm of pituitary gland and craniopharyngeal duct (pouch)   3. Unspecified hypothyroidism      Date: 07/01/2012  Rate: 76  Rhythm: normal sinus rhythm  QRS Axis: normal  Intervals: normal  ST/T Wave abnormalities: nonspecific ST/T changes  Conduction Disutrbances:none  Narrative Interpretation: T wave inversion inferiorly and laterally.  Old EKG Reviewed: none available    MDM  Patient with sharp chest pain. Enzymes are negative, however patient does have EKG changes. T waves are now inverted inferiorly and laterally. Troponins negative x2. Patient's continues to have pain. She'll be admitted to medicine.        Juliet Rude. Rubin Payor, MD 07/01/12 2241

## 2012-07-01 NOTE — ED Notes (Signed)
Pt. Is sitting quietly in waiting not clutching her chest when she came in.

## 2012-07-01 NOTE — ED Notes (Signed)
Pt. Stated, i started having Chest pain that started 2 hours ago.  I had this before 10 years ago and it was gas.

## 2012-07-02 LAB — CARDIAC PANEL(CRET KIN+CKTOT+MB+TROPI)
CK, MB: 1.1 ng/mL (ref 0.3–4.0)
CK, MB: 1.2 ng/mL (ref 0.3–4.0)
CK, MB: 1.3 ng/mL (ref 0.3–4.0)
Relative Index: INVALID (ref 0.0–2.5)
Relative Index: INVALID (ref 0.0–2.5)
Relative Index: INVALID (ref 0.0–2.5)
Total CK: 53 U/L (ref 7–177)
Total CK: 58 U/L (ref 7–177)
Total CK: 59 U/L (ref 7–177)
Troponin I: <0.3 ng/mL (ref <0.30)
Troponin I: <0.3 ng/mL (ref <0.30)
Troponin I: <0.3 ng/mL (ref <0.30)

## 2012-07-02 LAB — COMPREHENSIVE METABOLIC PANEL
ALT: 19 U/L (ref 0–35)
AST: 16 U/L (ref 0–37)
Albumin: 3.1 g/dL — ABNORMAL LOW (ref 3.5–5.2)
Alkaline Phosphatase: 126 U/L — ABNORMAL HIGH (ref 39–117)
BUN: 8 mg/dL (ref 6–23)
CO2: 25 mEq/L (ref 19–32)
Calcium: 9 mg/dL (ref 8.4–10.5)
Chloride: 102 mEq/L (ref 96–112)
Creatinine, Ser: 0.57 mg/dL (ref 0.50–1.10)
GFR calc Af Amer: >90 mL/min (ref >90)
GFR calc non Af Amer: >90 mL/min (ref >90)
Glucose, Bld: 113 mg/dL — ABNORMAL HIGH (ref 70–99)
Potassium: 3.4 mEq/L — ABNORMAL LOW (ref 3.5–5.1)
Sodium: 138 mEq/L (ref 135–145)
Total Bilirubin: 0.2 mg/dL — ABNORMAL LOW (ref 0.3–1.2)
Total Protein: 7.2 g/dL (ref 6.0–8.3)

## 2012-07-02 LAB — CBC WITH DIFFERENTIAL/PLATELET
Basophils Absolute: 0 10*3/uL (ref 0.0–0.1)
Basophils Relative: 0 % (ref 0–1)
Eosinophils Absolute: 0.2 10*3/uL (ref 0.0–0.7)
Eosinophils Relative: 3 % (ref 0–5)
HCT: 38.3 % (ref 36.0–46.0)
Hemoglobin: 12.4 g/dL (ref 12.0–15.0)
Lymphocytes Relative: 40 % (ref 12–46)
Lymphs Abs: 2.3 10*3/uL (ref 0.7–4.0)
MCH: 26.3 pg (ref 26.0–34.0)
MCHC: 32.4 g/dL (ref 30.0–36.0)
MCV: 81.1 fL (ref 78.0–100.0)
Monocytes Absolute: 0.4 10*3/uL (ref 0.1–1.0)
Monocytes Relative: 7 % (ref 3–12)
Neutro Abs: 2.9 10*3/uL (ref 1.7–7.7)
Neutrophils Relative %: 50 % (ref 43–77)
Platelets: 281 10*3/uL (ref 150–400)
RBC: 4.72 MIL/uL (ref 3.87–5.11)
RDW: 14.3 % (ref 11.5–15.5)
WBC: 5.8 10*3/uL (ref 4.0–10.5)

## 2012-07-02 LAB — TSH: TSH: 2.268 u[IU]/mL (ref 0.350–4.500)

## 2012-07-02 LAB — MRSA PCR SCREENING: MRSA by PCR: NEGATIVE

## 2012-07-02 MED ORDER — DIPHENHYDRAMINE HCL 25 MG PO CAPS
25.0000 mg | ORAL_CAPSULE | Freq: Three times a day (TID) | ORAL | Status: DC | PRN
  Administered 2012-07-02: 25 mg via ORAL
  Filled 2012-07-02: qty 1

## 2012-07-02 MED ORDER — NAPROXEN 250 MG PO TABS: 250.0000 mg | ORAL_TABLET | Freq: Two times a day (BID) | ORAL | Status: DC

## 2012-07-02 MED ORDER — MORPHINE SULFATE 2 MG/ML IJ SOLN
2.0000 mg | INTRAMUSCULAR | Status: DC | PRN
  Administered 2012-07-02 (×4): 2 mg via INTRAVENOUS
  Filled 2012-07-02 (×4): qty 1

## 2012-07-02 MED ORDER — NAPROXEN 375 MG PO TABS
375.0000 mg | ORAL_TABLET | ORAL | Status: AC
  Administered 2012-07-02: 375 mg via ORAL
  Filled 2012-07-02 (×2): qty 1

## 2012-07-02 MED ORDER — DIPHENHYDRAMINE HCL 25 MG PO CAPS: 25.0000 mg | ORAL_CAPSULE | Freq: Every evening | ORAL | Status: DC | PRN

## 2012-07-02 NOTE — Progress Notes (Addendum)
complained of heaviness on mid chest non radiating scale of 5/10 and also headache. Bp-168/87, hr- 71, r-18, 99% o2 sat on room air.stat ekg done with no changes, morphine 2 mg iv given with relief after 20 min. Continue to monitor.

## 2012-07-02 NOTE — Progress Notes (Signed)
Utilization Review Completed.  Chelsea Thompson   07/02/2012  

## 2012-07-02 NOTE — Progress Notes (Signed)
  Echocardiogram 2D Echocardiogram has been performed.  Cathie Beams 07/02/2012, 1:38 PM

## 2012-07-02 NOTE — H&P (Signed)
Chelsea Thompson is an 41 y.o. female.   PCP - Dr.James John. Chief Complaint: Chest pain. HPI: 41 year-old female with history of pituitary, hypothyroidism, hypertension and obesity started spacing chest pain yesterday afternoon. The pain is more retrosternal and right-sided chest with no radiation and increased on deep inspiration. There is an exertional symptoms or any associate shortness of breath. Denies nausea vomiting or diaphoresis. Since the pain is persistent patient came to the ER. Cardiac enzymes were negative. EKG shows T-wave inversions in the inferior leads which were new compared to the old one. Since the chest pain was persistent CT angiogram of the chest was done which does not show any acute. Patient will be admitted for further observation.  Past Medical History  Diagnosis Date  . Pituitary adenoma   . Obesity   . Hypothyroidism     DR Lurene Shadow  . HTN (hypertension)   . Glucose intolerance (impaired glucose tolerance)   . Allergic rhinitis   . Depression   . Hyperlipidemia   . Impaired glucose tolerance 10/04/2011  . Anxiety 02/27/2012    Past Surgical History  Procedure Date  . Breast reduction surgery   . Tubal ligation   . Dilation and curettage of uterus 07/2010    Hysteroscopy NEG w/Dr Mezer    Family History  Problem Relation Age of Onset  . Other Mother 25    MVA   . Hypertension Mother   . Hypertension Sister   . Hypertension Brother    Social History:  reports that she has never smoked. She does not have any smokeless tobacco history on file. She reports that she does not drink alcohol or use illicit drugs.  Allergies: No Known Allergies  Medications Prior to Admission  Medication Sig Dispense Refill  . cabergoline (DOSTINEX) 0.5 MG tablet Take 0.5 mg by mouth 2 (two) times a week.       . hydrochlorothiazide (MICROZIDE) 12.5 MG capsule Take 1 capsule (12.5 mg total) by mouth daily.  90 capsule  3  . levothyroxine (LEVOTHROID) 25 MCG tablet Take 1  tablet (25 mcg total) by mouth daily.  90 tablet  3  . Multiple Vitamin (MULTIVITAMIN WITH MINERALS) TABS Take 1 tablet by mouth daily.        Results for orders placed during the hospital encounter of 07/01/12 (from the past 48 hour(s))  URINALYSIS, ROUTINE W REFLEX MICROSCOPIC     Status: Abnormal   Collection Time   07/01/12  2:49 PM      Component Value Range Comment   Color, Urine YELLOW  YELLOW    APPearance CLOUDY (*) CLEAR    Specific Gravity, Urine 1.010  1.005 - 1.030    pH 6.0  5.0 - 8.0    Glucose, UA NEGATIVE  NEGATIVE mg/dL    Hgb urine dipstick LARGE (*) NEGATIVE    Bilirubin Urine NEGATIVE  NEGATIVE    Ketones, ur NEGATIVE  NEGATIVE mg/dL    Protein, ur NEGATIVE  NEGATIVE mg/dL    Urobilinogen, UA 0.2  0.0 - 1.0 mg/dL    Nitrite NEGATIVE  NEGATIVE    Leukocytes, UA MODERATE (*) NEGATIVE   URINE MICROSCOPIC-ADD ON     Status: Abnormal   Collection Time   07/01/12  2:49 PM      Component Value Range Comment   Squamous Epithelial / LPF FEW (*) RARE    WBC, UA 0-2  <3 WBC/hpf    RBC / HPF TOO NUMEROUS TO COUNT  <3 RBC/hpf  Bacteria, UA RARE  RARE   CBC     Status: Normal   Collection Time   07/01/12  2:52 PM      Component Value Range Comment   WBC 7.2  4.0 - 10.5 K/uL    RBC 4.92  3.87 - 5.11 MIL/uL    Hemoglobin 12.9  12.0 - 15.0 g/dL    HCT 16.1  09.6 - 04.5 %    MCV 80.3  78.0 - 100.0 fL    MCH 26.2  26.0 - 34.0 pg    MCHC 32.7  30.0 - 36.0 g/dL    RDW 40.9  81.1 - 91.4 %    Platelets 335  150 - 400 K/uL   BASIC METABOLIC PANEL     Status: Abnormal   Collection Time   07/01/12  2:52 PM      Component Value Range Comment   Sodium 138  135 - 145 mEq/L    Potassium 3.9  3.5 - 5.1 mEq/L    Chloride 101  96 - 112 mEq/L    CO2 30  19 - 32 mEq/L    Glucose, Bld 66 (*) 70 - 99 mg/dL    BUN 7  6 - 23 mg/dL    Creatinine, Ser 7.82  0.50 - 1.10 mg/dL    Calcium 9.7  8.4 - 95.6 mg/dL    GFR calc non Af Amer >90  >90 mL/min    GFR calc Af Amer >90  >90 mL/min     POCT I-STAT TROPONIN I     Status: Normal   Collection Time   07/01/12  3:06 PM      Component Value Range Comment   Troponin i, poc 0.00  0.00 - 0.08 ng/mL    Comment 3            D-DIMER, QUANTITATIVE     Status: Normal   Collection Time   07/01/12  5:55 PM      Component Value Range Comment   D-Dimer, Quant 0.43  0.00 - 0.48 ug/mL-FEU   TROPONIN I     Status: Normal   Collection Time   07/01/12  7:38 PM      Component Value Range Comment   Troponin I <0.30  <0.30 ng/mL   MRSA PCR SCREENING     Status: Normal   Collection Time   07/01/12 11:20 PM      Component Value Range Comment   MRSA by PCR NEGATIVE  NEGATIVE   CARDIAC PANEL(CRET KIN+CKTOT+MB+TROPI)     Status: Normal   Collection Time   07/01/12 11:30 PM      Component Value Range Comment   Total CK 58  7 - 177 U/L    CK, MB 1.1  0.3 - 4.0 ng/mL    Troponin I <0.30  <0.30 ng/mL    Relative Index RELATIVE INDEX IS INVALID  0.0 - 2.5    Dg Chest 2 View  07/01/2012  *RADIOLOGY REPORT*  Clinical Data: Right chest pain.  CHEST - 2 VIEW  Comparison: 09/23/2008  Findings: Cardiac and mediastinal contours appear normal.  The lungs appear clear.  No pleural effusion is identified.  Minimal thoracic spondylosis noted.  IMPRESSION:  1.  No significant abnormality identified.  Original Report Authenticated By: Dellia Cloud, M.D.   Ct Angio Chest W/cm &/or Wo Cm  07/01/2012  *RADIOLOGY REPORT*  Clinical Data: Chest pain and elevated D-dimer.  CT ANGIOGRAPHY CHEST  Technique:  Multidetector CT imaging of  the chest using the standard protocol during bolus administration of intravenous contrast. Multiplanar reconstructed images including MIPs were obtained and reviewed to evaluate the vascular anatomy.  Contrast: OMNIPAQUE IOHEXOL 350 MG/ML SOLN  Comparison: None.  Findings: The pulmonary arteries are adequately opacified.  There is no evidence of pulmonary embolism.  The thoracic aorta is of normal caliber.  Lung windows show no  evidence of pulmonary infiltrates, edema or masses.  No pleural or pericardial fluid. The heart size is at the upper limits of normal.  There are no enlarged lymph nodes identified.  The bony thorax is unremarkable.  IMPRESSION: No evidence of pulmonary embolism or other acute findings in the chest.  The heart size is at the upper limits of normal.  Original Report Authenticated By: Reola Calkins, M.D.    Review of Systems  Constitutional: Negative.   HENT: Negative.   Eyes: Negative.   Respiratory: Negative.   Cardiovascular: Positive for chest pain.  Gastrointestinal: Negative.   Genitourinary: Negative.   Musculoskeletal: Negative.   Skin: Negative.   Neurological: Negative.   Endo/Heme/Allergies: Negative.   Psychiatric/Behavioral: Negative.     Blood pressure 147/68, pulse 60, temperature 98.2 F (36.8 C), temperature source Oral, resp. rate 26, last menstrual period 07/01/2012, SpO2 99.00%. Physical Exam  Constitutional: She is oriented to person, place, and time. She appears well-developed and well-nourished. No distress.  HENT:  Head: Normocephalic and atraumatic.  Right Ear: External ear normal.  Left Ear: External ear normal.  Nose: Nose normal.  Mouth/Throat: Oropharynx is clear and moist. No oropharyngeal exudate.  Eyes: Conjunctivae are normal. Pupils are equal, round, and reactive to light. Right eye exhibits no discharge. Left eye exhibits no discharge. No scleral icterus.  Neck: Normal range of motion. Neck supple.  Cardiovascular: Normal rate and regular rhythm.   Respiratory: Effort normal and breath sounds normal. No respiratory distress. She has no wheezes. She has no rales. She exhibits tenderness.  GI: Soft. Bowel sounds are normal. She exhibits no distension. There is no tenderness. There is no rebound.  Musculoskeletal: Normal range of motion. She exhibits no edema and no tenderness.  Neurological: She is alert and oriented to person, place, and time.        Moves all extremities.  Skin: Skin is warm and dry. She is not diaphoretic.  Psychiatric: Her behavior is normal.     Assessment/Plan #1. Atypical chest pain - cycle cardiac markers. Aspirin. Get 2-D echo. Pain relief medications. #2. History of pituitary adenoma - continue present medications. #3. History of hypertension - continue present medications. #4. History of hypothyroidism - continue present medications.  Since patient has persistent chest pain patient has been admitted to step down.  CODE STATUS - full code.  Cayson Kalb N. 07/02/2012, 12:56 AM

## 2012-07-02 NOTE — Progress Notes (Signed)
To discharge home, discharge instructions given to pt. Awaiting husband to come and pick her up.

## 2012-07-03 LAB — URINE CULTURE
Colony Count: NO GROWTH
Culture: NO GROWTH

## 2012-07-31 NOTE — Discharge Summary (Signed)
Physician Discharge Summary  Chelsea Thompson WUJ:[id number] DOB: October 08, 1971 DOA: 07/01/2012  PCP: Oliver Barre, MD  Admit date: 07/01/2012 Discharge date: 07/02/2012  Recommendations for Outpatient Follow-up:  1. outpt f/u with PCP  Discharge Diagnoses:  Principal Problem:  *Chest pain- atypical, likely musculoskeletal Active Problems:  PITUITARY ADENOMA  HYPOTHYROIDISM  HYPERTENSION   Discharge Condition: stable  Diet recommendation: heart healthy  Filed Weights   07/02/12 0030  Weight: 143.2 kg (315 lb 11.2 oz)    History of present illness:  41 year-old female with history of pituitary, hypothyroidism, hypertension and obesity started spacing chest pain yesterday afternoon. The pain is more retrosternal and right-sided chest with no radiation and increased on deep inspiration. There is no exertional symptom or any association shortness of breath. Denies nausea vomiting or diaphoresis. Since the pain is persistent patient came to the ER. Cardiac enzymes were negative. EKG shows T-wave inversions in the inferior leads which were new compared to the old one. Since the chest pain was persistent CT angiogram of the chest was done which does not show any acute abnormalities.  Hospital Course:  As above patient was noted to have chest pain which brought her to the ER. On Exam, it was noted that pain was reproducible. EKG changes were also noted but upon my review were noted to be only only Lead 3 and aVF and thus more non-specific- repeat EKG did not reveal these changes although her chest pain was unchanged. ECHO did not reveal wall motion abnormalities. Pt was ruled out for MI with 3 sets of negative cardiac enzymes. On further exam, it was noted that the pain was again reproducible. At this point, we will attempt to treat this as musculoskeletal pain. She is advised if the pain changes or begins to be related more with exertion, she is to call her PCP and discuss it or return to the ER.    Procedures:  2D ECHO- 07/02/12  - Left ventricle: The cavity size was normal. Wall thickness was increased in a pattern of moderate LVH. Systolic function was vigorous. The estimated ejection fraction was in the range of 65% to 70%. Although no diagnostic regional wall motion abnormality was identified, this possibility cannot be completely excluded on the basis of this study. Doppler parameters are consistent with abnormal left ventricular relaxation (grade 1 diastolic dysfunction). - Right ventricle: The cavity size was mildly dilated. Wall thickness was normal.   Discharge Exam: Filed Vitals:   07/02/12 1624  BP: 156/96  Pulse: 64  Temp: 97.9 F (36.6 C)  Resp:    Filed Vitals:   07/02/12 0444 07/02/12 0800 07/02/12 1207 07/02/12 1624  BP:  157/90 158/79 156/96  Pulse:   72 64  Temp: 97.8 F (36.6 C)  98.1 F (36.7 C) 97.9 F (36.6 C)  TempSrc: Oral  Oral Oral  Resp:  18    Height:      Weight:      SpO2:    100%    General: no acute distress Cardiovascular: RRR, no murmurs Respiratory: CTA b/l   Discharge Instructions  Discharge Orders    Future Appointments: Provider: Department: Dept Phone: Center:   10/02/2012 1:00 PM Corwin Levins, MD [facility] [phone] [facility]     Future Orders Please Complete By Expires   Diet - low sodium heart healthy      Increase activity slowly      Discharge instructions      Comments:   Take Naprosyn (Alleve) for a minimum  of 2-3 days for your chest pain.  Can take a Pepcid, Zantac or Prilosec if you start to have heart burn from [facility].     Medication List  As of 07/31/2012  4:44 PM   TAKE these medications         cabergoline 0.5 MG tablet   Commonly known as: DOSTINEX   Take 0.5 mg by mouth 2 (two) times a week.      diphenhydrAMINE 25 mg capsule   Commonly known as: BENADRYL   Take 1 capsule (25 mg total) by mouth at bedtime as needed for sleep (at bedtime for sleep).      hydrochlorothiazide 12.5 MG  capsule   Commonly known as: MICROZIDE   Take 1 capsule (12.5 mg total) by mouth daily.      levothyroxine 25 MCG tablet   Commonly known as: SYNTHROID, LEVOTHROID   Take 1 tablet (25 mcg total) by mouth daily.      multivitamin with minerals Tabs   Take 1 tablet by mouth daily.      naproxen 250 MG tablet   Commonly known as: NAPROSYN   Take 1 tablet (250 mg total) by mouth 2 (two) times daily with a meal.              The results of significant diagnostics from this hospitalization (including imaging, microbiology, ancillary and laboratory) are listed below for reference.    Significant Diagnostic Studies: Dg Chest 2 View  07/01/2012  *RADIOLOGY REPORT*  Clinical Data: Right chest pain.  CHEST - 2 VIEW  Comparison: 09/23/2008  Findings: Cardiac and mediastinal contours appear normal.  The lungs appear clear.  No pleural effusion is identified.  Minimal thoracic spondylosis noted.  IMPRESSION:  1.  No significant abnormality identified.  Original Report Authenticated By: Dellia Cloud, M.D.   Ct Angio Chest W/cm &/or Wo Cm  07/01/2012  *RADIOLOGY REPORT*  Clinical Data: Chest pain and elevated D-dimer.  CT ANGIOGRAPHY CHEST  Technique:  Multidetector CT imaging of the chest using the standard protocol during bolus administration of intravenous contrast. Multiplanar reconstructed images including MIPs were obtained and reviewed to evaluate the vascular anatomy.  Contrast: OMNIPAQUE IOHEXOL 350 MG/ML SOLN  Comparison: None.  Findings: The pulmonary arteries are adequately opacified.  There is no evidence of pulmonary embolism.  The thoracic aorta is of normal caliber.  Lung windows show no evidence of pulmonary infiltrates, edema or masses.  No pleural or pericardial fluid. The heart size is at the upper limits of normal.  There are no enlarged lymph nodes identified.  The bony thorax is unremarkable.  IMPRESSION: No evidence of pulmonary embolism or other acute findings in  the chest.  The heart size is at the upper limits of normal.  Original Report Authenticated By: Reola Calkins, M.D.    Microbiology: No results found for this or any previous visit (from the past 240 hour(s)).   Labs: Basic Metabolic Panel: No results found for this basename: NA:5,K:5,CL:5,CO2:5,GLUCOSE:5,BUN:5,CREATININE:5,CALCIUM:5,MG:5,PHOS:5 in the last 168 hours Liver Function Tests: No results found for this basename: AST:5,ALT:5,ALKPHOS:5,BILITOT:5,PROT:5,ALBUMIN:5 in the last 168 hours No results found for this basename: LIPASE:5,AMYLASE:5 in the last 168 hours No results found for this basename: AMMONIA:5 in the last 168 hours CBC: No results found for this basename: WBC:5,NEUTROABS:5,HGB:5,HCT:5,MCV:5,PLT:5 in the last 168 hours Cardiac Enzymes: No results found for this basename: CKTOTAL:5,CKMB:5,CKMBINDEX:5,TROPONINI:5 in the last 168 hours BNP: BNP (last 3 results) No results found for this basename: PROBNP:3 in the  last 8760 hours CBG: No results found for this basename: GLUCAP:5 in the last 168 hours  Time coordinating discharge: >45 minutes  Signed:  Chrishawna Farina  [facility] 07/31/2012, 4:44 PM

## 2012-10-02 ENCOUNTER — Encounter: Payer: Self-pay | Admitting: Internal Medicine

## 2012-10-02 ENCOUNTER — Encounter: Payer: BC Managed Care – PPO | Admitting: Internal Medicine

## 2012-10-02 ENCOUNTER — Ambulatory Visit (INDEPENDENT_AMBULATORY_CARE_PROVIDER_SITE_OTHER): Payer: BC Managed Care – PPO | Admitting: Internal Medicine

## 2012-10-02 ENCOUNTER — Other Ambulatory Visit (INDEPENDENT_AMBULATORY_CARE_PROVIDER_SITE_OTHER): Payer: BC Managed Care – PPO

## 2012-10-02 ENCOUNTER — Telehealth: Payer: Self-pay

## 2012-10-02 ENCOUNTER — Telehealth: Payer: Self-pay | Admitting: Internal Medicine

## 2012-10-02 VITALS — BP 110/80 | HR 76 | Temp 98.1°F | Ht 65.0 in | Wt 317.2 lb

## 2012-10-02 DIAGNOSIS — R5383 Other fatigue: Secondary | ICD-10-CM | POA: Insufficient documentation

## 2012-10-02 DIAGNOSIS — Z0000 Encounter for general adult medical examination without abnormal findings: Secondary | ICD-10-CM

## 2012-10-02 DIAGNOSIS — R079 Chest pain, unspecified: Secondary | ICD-10-CM

## 2012-10-02 DIAGNOSIS — M25519 Pain in unspecified shoulder: Secondary | ICD-10-CM

## 2012-10-02 DIAGNOSIS — M25511 Pain in right shoulder: Secondary | ICD-10-CM

## 2012-10-02 DIAGNOSIS — M542 Cervicalgia: Secondary | ICD-10-CM

## 2012-10-02 DIAGNOSIS — E669 Obesity, unspecified: Secondary | ICD-10-CM

## 2012-10-02 LAB — CBC WITH DIFFERENTIAL/PLATELET
Basophils Absolute: 0 10*3/uL (ref 0.0–0.1)
Basophils Relative: 0.5 % (ref 0.0–3.0)
Eosinophils Absolute: 0.3 10*3/uL (ref 0.0–0.7)
Eosinophils Relative: 3.5 % (ref 0.0–5.0)
HCT: 36.6 % (ref 36.0–46.0)
Hemoglobin: 12 g/dL (ref 12.0–15.0)
Lymphocytes Relative: 30.5 % (ref 12.0–46.0)
Lymphs Abs: 2.2 10*3/uL (ref 0.7–4.0)
MCHC: 32.8 g/dL (ref 30.0–36.0)
MCV: 81.4 fl (ref 78.0–100.0)
Monocytes Absolute: 0.6 10*3/uL (ref 0.1–1.0)
Monocytes Relative: 7.8 % (ref 3.0–12.0)
Neutro Abs: 4.2 10*3/uL (ref 1.4–7.7)
Neutrophils Relative %: 57.7 % (ref 43.0–77.0)
Platelets: 365 10*3/uL (ref 150.0–400.0)
RBC: 4.49 Mil/uL (ref 3.87–5.11)
RDW: 14 % (ref 11.5–14.6)
WBC: 7.3 10*3/uL (ref 4.5–10.5)

## 2012-10-02 LAB — URINALYSIS, ROUTINE W REFLEX MICROSCOPIC
Bilirubin Urine: NEGATIVE
Hgb urine dipstick: NEGATIVE
Ketones, ur: NEGATIVE
Leukocytes, UA: NEGATIVE
Nitrite: NEGATIVE
Specific Gravity, Urine: 1.01 (ref 1.000–1.030)
Total Protein, Urine: NEGATIVE
Urine Glucose: NEGATIVE
Urobilinogen, UA: 0.2 (ref 0.0–1.0)
pH: 6.5 (ref 5.0–8.0)

## 2012-10-02 LAB — LIPID PANEL
Cholesterol: 231 mg/dL — ABNORMAL HIGH (ref 0–200)
HDL: 36.6 mg/dL — ABNORMAL LOW (ref >39.00)
Total CHOL/HDL Ratio: 6
Triglycerides: 112 mg/dL (ref 0.0–149.0)
VLDL: 22.4 mg/dL (ref 0.0–40.0)

## 2012-10-02 LAB — HEPATIC FUNCTION PANEL
ALT: 23 U/L (ref 0–35)
AST: 18 U/L (ref 0–37)
Albumin: 3.3 g/dL — ABNORMAL LOW (ref 3.5–5.2)
Alkaline Phosphatase: 119 U/L — ABNORMAL HIGH (ref 39–117)
Bilirubin, Direct: 0.1 mg/dL (ref 0.0–0.3)
Total Bilirubin: 0.4 mg/dL (ref 0.3–1.2)
Total Protein: 7.9 g/dL (ref 6.0–8.3)

## 2012-10-02 LAB — BASIC METABOLIC PANEL
BUN: 12 mg/dL (ref 6–23)
CO2: 31 mEq/L (ref 19–32)
Calcium: 8.9 mg/dL (ref 8.4–10.5)
Chloride: 98 mEq/L (ref 96–112)
Creatinine, Ser: 0.7 mg/dL (ref 0.4–1.2)
GFR: 129.83 mL/min (ref >60.00)
Glucose, Bld: 88 mg/dL (ref 70–99)
Potassium: 3.3 mEq/L — ABNORMAL LOW (ref 3.5–5.1)
Sodium: 136 mEq/L (ref 135–145)

## 2012-10-02 LAB — LDL CHOLESTEROL, DIRECT: Direct LDL: 185.8 mg/dL

## 2012-10-02 MED ORDER — CABERGOLINE 0.5 MG PO TABS: 0.5000 mg | ORAL_TABLET | ORAL | Status: DC

## 2012-10-02 MED ORDER — TRAMADOL HCL 50 MG PO TABS: 50.0000 mg | ORAL_TABLET | Freq: Four times a day (QID) | ORAL | Status: DC | PRN

## 2012-10-02 MED ORDER — SERTRALINE HCL 100 MG PO TABS: ORAL_TABLET | ORAL | Status: DC

## 2012-10-02 MED ORDER — PREDNISONE 10 MG PO TABS: ORAL_TABLET | ORAL | Status: DC

## 2012-10-02 MED ORDER — ZOLPIDEM TARTRATE 10 MG PO TABS: 10.0000 mg | ORAL_TABLET | Freq: Every evening | ORAL | Status: DC | PRN

## 2012-10-02 MED ORDER — NADOLOL-BENDROFLUMETHIAZIDE 40-5 MG PO TABS: 1.0000 | ORAL_TABLET | Freq: Every day | ORAL | Status: DC

## 2012-10-02 MED ORDER — LEVOTHYROXINE SODIUM 25 MCG PO TABS: 25.0000 ug | ORAL_TABLET | Freq: Every day | ORAL | Status: DC

## 2012-10-02 NOTE — Telephone Encounter (Signed)
Ok for referral?

## 2012-10-02 NOTE — Telephone Encounter (Signed)
Put lab order in. 

## 2012-10-02 NOTE — Telephone Encounter (Signed)
Message copied by Corwin Levins on Wed Oct 02, 2012  2:11 PM ------      Message from: Scharlene Gloss B      Created: Wed Oct 02, 2012  1:56 PM       The patient would like a referral to weight management or nutritionist

## 2012-10-02 NOTE — Patient Instructions (Addendum)
You had the flu shot today Please remember to followup with your GYN for the yearly pap smear and/or mammogram  (please consider [facility] on [address], or [facility] on [address]) You are otherwise up to date with prevention measures Take all new medications as prescribed - the pain medication, and prednisone Continue all other medications as before, and all of your refills were done today You will be contacted regarding the referral for: orthopedic You are given the work note today Please return in 1 year for your yearly visit, or sooner if needed, with Lab testing done 3-5 days before

## 2012-10-02 NOTE — Progress Notes (Signed)
Subjective:    Patient ID: Chelsea Thompson, female    DOB: 1971/01/14, 40 y.o.   MRN: [id number]  HPI  Here for wellness and f/u;  Overall doing ok;  Pt denies CP, worsening SOB, DOE, wheezing, orthopnea, PND, worsening LE edema, palpitations, dizziness or syncope.  Pt denies neurological change such as new Headache, facial or extremity weakness.  Pt denies polydipsia, polyuria, or low sugar symptoms. Pt states overall good compliance with treatment and medications, good tolerability, and trying to follow lower cholesterol diet.  Pt denies worsening depressive symptoms, suicidal ideation or panic. No fever, wt loss, night sweats, loss of appetite, or other constitutional symptoms.  Pt states good ability with ADL's, low fall risk, home safety reviewed and adequate, no significant changes in hearing or vision, and occasionally active with exercise.  Cont's to miss work due to recurring "chest pain" with signficant evaluation with neg CT chest, ecg, labs in July 2013, but careful hx today indicates c/o primary pain to the right lateral lower neck and shoulder with some pain referring to the right upper chest, to which she has referred to in the ER.   Past Medical History  Diagnosis Date  . Pituitary adenoma   . Obesity   . Hypothyroidism     DR Lurene Shadow  . HTN (hypertension)   . Glucose intolerance (impaired glucose tolerance)   . Allergic rhinitis   . Depression   . Hyperlipidemia   . Impaired glucose tolerance 10/04/2011  . Anxiety 02/27/2012   Past Surgical History  Procedure Date  . Breast reduction surgery   . Tubal ligation   . Dilation and curettage of uterus 07/2010    Hysteroscopy NEG w/Dr Mezer    reports that she has never smoked. She does not have any smokeless tobacco history on file. She reports that she does not drink alcohol or use illicit drugs. family history includes Hypertension in her brother, mother, and sister and Other (age of onset:25) in her mother. No Known  Allergies Current Outpatient Prescriptions on File Prior to Visit  Medication Sig Dispense Refill  . levothyroxine (LEVOTHROID) 25 MCG tablet Take 1 tablet (25 mcg total) by mouth daily.  90 tablet  3  . Multiple Vitamin (MULTIVITAMIN WITH MINERALS) TABS Take 1 tablet by mouth daily.      . diphenhydrAMINE (BENADRYL) 25 mg capsule Take 1 capsule (25 mg total) by mouth at bedtime as needed for sleep (at bedtime for sleep).  30 capsule    . nadolol-bendroflumethiazide (CORZIDE) 40-5 MG per tablet Take 1 tablet by mouth daily.  90 tablet  3  . zolpidem (AMBIEN) 10 MG tablet Take 1 tablet (10 mg total) by mouth at bedtime as needed for sleep.  30 tablet  5  . DISCONTD: fexofenadine (ALLEGRA) 180 MG tablet Take 180 mg by mouth daily.         Review of Systems Review of Systems  Constitutional: Negative for diaphoresis, activity change, appetite change and unexpected weight change.  HENT: Negative for hearing loss, ear pain, facial swelling, mouth sores and neck stiffness.   Eyes: Negative for pain, redness and visual disturbance.  Respiratory: Negative for shortness of breath and wheezing.   Cardiovascular: Negative for chest pain and palpitations.  Gastrointestinal: Negative for diarrhea, blood in stool, abdominal distention and rectal pain.  Genitourinary: Negative for hematuria, flank pain and decreased urine volume.  Musculoskeletal: Negative for myalgias and joint swelling.  Skin: Negative for color change and wound.  Neurological: Negative for syncope  and numbness.  Hematological: Negative for adenopathy.  Psychiatric/Behavioral: Negative for hallucinations, self-injury, decreased concentration and agitation.      Objective:   Physical Exam BP 110/80  Pulse 76  Temp 98.1 F (36.7 C) (Oral)  Ht 5\' 5"  (1.651 m)  Wt 317 lb 4 oz (143.904 kg)  BMI 52.79 kg/m2  SpO2 99% Physical Exam  VS noted Constitutional: Pt is oriented to person, place, and time. Appears well-developed and  well-nourished.  HENT:  Head: Normocephalic and atraumatic.  Right Ear: External ear normal.  Left Ear: External ear normal.  Nose: Nose normal.  Mouth/Throat: Oropharynx is clear and moist.  Eyes: Conjunctivae and EOM are normal. Pupils are equal, round, and reactive to light.  Neck: Normal range of motion. Neck supple. No JVD present. No tracheal deviation present.  Cardiovascular: Normal rate, regular rhythm, normal heart sounds and intact distal pulses.   Pulmonary/Chest: Effort normal and breath sounds normal.  Abdominal: Soft. Bowel sounds are normal. There is no tenderness.  Musculoskeletal: Normal range of motion. Exhibits no edema.  Lymphadenopathy:  Has no cervical adenopathy.  Neurological: Pt is alert and oriented to person, place, and time. Pt has normal reflexes. No cranial nerve deficit. UE;s normal motor/sens/dtr Skin: Skin is warm and dry. No rash noted.  Psychiatric:  Has  normal mood and affect. Behavior is normal.  Right shoudler with mod tender to the subacromail bursa area, and tender right lateral neck/trapezoid area    Assessment & Plan:

## 2012-10-02 NOTE — Telephone Encounter (Signed)
Caller: [name]/Patient; Patient Name: Chelsea Thompson; PCP: Oliver Barre (Adults only); Best Callback Phone Number: [phone]; Call regarding: Ambien; was seen in the office today and said Ambien was supposed to be sent to the pharmacy with her other meds; all the other meds were there, but not the Ambien; checked epic and today's OV did not include Ambien as a med to be started; instructed Kim to call back in the am since the office closed at 1700

## 2012-10-02 NOTE — Telephone Encounter (Signed)
Done hardcopy to robin  

## 2012-10-03 ENCOUNTER — Other Ambulatory Visit: Payer: Self-pay | Admitting: Sports Medicine

## 2012-10-03 ENCOUNTER — Ambulatory Visit
Admission: RE | Admit: 2012-10-03 | Discharge: 2012-10-03 | Disposition: A | Discharge status: Home / Self Care | Payer: BC Managed Care – PPO | Source: Ambulatory Visit | Attending: Sports Medicine | Admitting: Sports Medicine

## 2012-10-03 DIAGNOSIS — M542 Cervicalgia: Secondary | ICD-10-CM

## 2012-10-03 NOTE — Telephone Encounter (Signed)
Faxed hardcopy to  [employer] per pt. Request  And pt. Informed.

## 2012-10-04 ENCOUNTER — Telehealth: Payer: Self-pay

## 2012-10-04 ENCOUNTER — Encounter: Payer: Self-pay | Admitting: Internal Medicine

## 2012-10-04 DIAGNOSIS — Z0000 Encounter for general adult medical examination without abnormal findings: Secondary | ICD-10-CM

## 2012-10-04 LAB — HEMOGLOBIN A1C: Hgb A1c MFr Bld: 5.7 % (ref 4.6–6.5)

## 2012-10-04 NOTE — Telephone Encounter (Signed)
Called left message to call back 

## 2012-10-04 NOTE — Telephone Encounter (Signed)
Please call pt to re-draw, as she needs this in f/u to taking the levothyroxine

## 2012-10-04 NOTE — Telephone Encounter (Signed)
The lab called and they did not have enough sample to do TSH.

## 2012-10-06 ENCOUNTER — Encounter: Payer: Self-pay | Admitting: Internal Medicine

## 2012-10-06 DIAGNOSIS — M542 Cervicalgia: Secondary | ICD-10-CM | POA: Insufficient documentation

## 2012-10-06 DIAGNOSIS — M25511 Pain in right shoulder: Secondary | ICD-10-CM | POA: Insufficient documentation

## 2012-10-06 NOTE — Assessment & Plan Note (Signed)
C/w bursitis, for predpack asd,  to f/u any worsening symptoms or concerns

## 2012-10-06 NOTE — Assessment & Plan Note (Signed)

## 2012-10-06 NOTE — Assessment & Plan Note (Signed)
Recent eval neg, no further eval at this time

## 2012-10-06 NOTE — Assessment & Plan Note (Signed)
I think most likely msk, but cant r/o neuritic, for ortho referral

## 2012-10-07 NOTE — Telephone Encounter (Signed)
Called the patient left message to call back 

## 2012-10-07 NOTE — Telephone Encounter (Signed)
Called left message to call back 

## 2012-10-08 NOTE — Telephone Encounter (Signed)
Patient called back on her cell number [phone] informed of instructions.  Did add cell number to chart per pt. Instructions.

## 2012-10-22 ENCOUNTER — Other Ambulatory Visit: Payer: Self-pay | Admitting: Neurosurgery

## 2012-10-23 ENCOUNTER — Encounter (HOSPITAL_COMMUNITY): Payer: Self-pay | Admitting: Pharmacy Technician

## 2012-10-29 ENCOUNTER — Inpatient Hospital Stay (HOSPITAL_COMMUNITY): Admission: RE | Admit: 2012-10-29 | Payer: BC Managed Care – PPO | Source: Ambulatory Visit

## 2012-10-29 ENCOUNTER — Ambulatory Visit: Payer: BC Managed Care – PPO | Admitting: *Deleted

## 2012-11-05 ENCOUNTER — Encounter (HOSPITAL_COMMUNITY): Payer: Self-pay | Admitting: Pharmacy Technician

## 2012-11-12 ENCOUNTER — Encounter (HOSPITAL_COMMUNITY)
Admission: RE | Admit: 2012-11-12 | Discharge: 2012-11-12 | Payer: BC Managed Care – PPO | Source: Ambulatory Visit | Attending: Neurosurgery | Admitting: Neurosurgery

## 2012-11-12 NOTE — Pre-Procedure Instructions (Signed)
20 Frederica Chrestman  11/12/2012   Your procedure is scheduled on: Monday, December 9TH  Report to [facility] at 5:30 AM.  Call this number if you have problems the morning of surgery: [phone]   Remember:   Do not eat food OR drink any liquids:After Midnight Sunday.    Take these medicines the morning of surgery with A SIP OF WATER: Levothyroxine, Tramadol, zoloft   Do not wear jewelry, make-up or nail polish.  Do not wear lotions, powders, or perfumes. You may NOT wear deodorant.  LADIES---Do not shave 48 hours prior to surgery.    Do not bring valuables to the hospital.  Contacts, dentures or bridgework may not be worn into surgery.   Leave suitcase in the car. After surgery it may be brought to your room.  For patients admitted to the hospital, checkout time is 11:00 AM the day of discharge.   Patients discharged the day of surgery will not be allowed to drive home.  Name and phone number of your driver:    Special Instructions: Shower using CHG 2 nights before surgery and the night before surgery.  If you shower the day of surgery use CHG.  Use special wash - you have one bottle of CHG for all showers.  You should use approximately 1/3 of the bottle for each shower.   Please read over the following fact sheets that you were given: Pain Booklet, Coughing and Deep Breathing, MRSA Information and Surgical Site Infection Prevention

## 2012-11-14 ENCOUNTER — Ambulatory Visit (INDEPENDENT_AMBULATORY_CARE_PROVIDER_SITE_OTHER): Payer: BC Managed Care – PPO | Admitting: Internal Medicine

## 2012-11-14 ENCOUNTER — Encounter: Payer: Self-pay | Admitting: Internal Medicine

## 2012-11-14 ENCOUNTER — Encounter (HOSPITAL_COMMUNITY)
Admission: RE | Admit: 2012-11-14 | Discharge: 2012-11-14 | Payer: BC Managed Care – PPO | Source: Ambulatory Visit | Attending: Neurosurgery | Admitting: Neurosurgery

## 2012-11-14 VITALS — BP 120/72 | HR 80 | Temp 97.9°F | Ht 64.0 in | Wt 318.5 lb

## 2012-11-14 DIAGNOSIS — J019 Acute sinusitis, unspecified: Secondary | ICD-10-CM

## 2012-11-14 DIAGNOSIS — I10 Essential (primary) hypertension: Secondary | ICD-10-CM

## 2012-11-14 DIAGNOSIS — F419 Anxiety disorder, unspecified: Secondary | ICD-10-CM

## 2012-11-14 DIAGNOSIS — R062 Wheezing: Secondary | ICD-10-CM

## 2012-11-14 DIAGNOSIS — F411 Generalized anxiety disorder: Secondary | ICD-10-CM

## 2012-11-14 DIAGNOSIS — J209 Acute bronchitis, unspecified: Secondary | ICD-10-CM | POA: Insufficient documentation

## 2012-11-14 MED ORDER — CABERGOLINE 0.5 MG PO TABS: 0.2500 mg | ORAL_TABLET | ORAL | Status: DC

## 2012-11-14 MED ORDER — PREDNISONE 10 MG PO TABS: ORAL_TABLET | ORAL | Status: DC

## 2012-11-14 MED ORDER — AZITHROMYCIN 250 MG PO TABS: ORAL_TABLET | ORAL | Status: DC

## 2012-11-14 MED ORDER — HYDROCODONE-HOMATROPINE 5-1.5 MG/5ML PO SYRP: 5.0000 mL | ORAL_SOLUTION | Freq: Four times a day (QID) | ORAL | Status: DC | PRN

## 2012-11-14 MED ORDER — TRAMADOL HCL 50 MG PO TABS: 50.0000 mg | ORAL_TABLET | Freq: Four times a day (QID) | ORAL | Status: DC | PRN

## 2012-11-14 NOTE — Patient Instructions (Addendum)
Take all new medications as prescribed - the antibiotic and prednisone, and cough med Continue all other medications as before Your cabergoline prescription was corrected Your ultram was refilled Good Luck with your Surgury Monday!

## 2012-11-14 NOTE — Assessment & Plan Note (Signed)
Mild to mod, for predpack asd,  to f/u any worsening symptoms or concerns 

## 2012-11-14 NOTE — Assessment & Plan Note (Signed)
stable overall by hx and exam, most recent data reviewed with pt, and pt to continue medical treatment as before Lab Results  Component Value Date   WBC 7.3 10/02/2012   HGB 12.0 10/02/2012   HCT 36.6 10/02/2012   PLT 365.0 10/02/2012   GLUCOSE 88 10/02/2012   CHOL 231* 10/02/2012   TRIG 112.0 10/02/2012   HDL 36.60* 10/02/2012   LDLDIRECT 185.8 10/02/2012   ALT 23 10/02/2012   AST 18 10/02/2012   NA 136 10/02/2012   K 3.3* 10/02/2012   CL 98 10/02/2012   CREATININE 0.7 10/02/2012   BUN 12 10/02/2012   CO2 31 10/02/2012   TSH 2.268 07/02/2012   HGBA1C 5.7 10/02/2012

## 2012-11-14 NOTE — Progress Notes (Signed)
Pt cancelled PAT appt; States she is "sick and running fever". States she has already called Dr Earl Gala office and has an appt today with her primary care MD.

## 2012-11-14 NOTE — Progress Notes (Signed)
Subjective:    Patient ID: Chelsea Thompson, female    DOB: 1971-09-12, 41 y.o.   MRN: [id number]  HPI  Here with acute onset mild to mod 2-3 days ST, HA, general weakness and malaise, with prod cough greenish sputum, but Pt denies chest pain, increased sob or doe, wheezing, orthopnea, PND, increased LE swelling, palpitations, dizziness or syncope, except for onset wheezing last PM with mild sob.   Pt denies new neurological symptoms such as new headache, or facial or extremity weakness or numbness.  Pt denies polydipsia, polyuria.  Denies worsening depressive symptoms, suicidal ideation, or panic, though has ongoing anxiety, not increased recently.   Past Medical History  Diagnosis Date  . Pituitary adenoma   . Obesity   . Hypothyroidism     DR Lurene Shadow  . HTN (hypertension)   . Glucose intolerance (impaired glucose tolerance)   . Allergic rhinitis   . Depression   . Hyperlipidemia   . Impaired glucose tolerance 10/04/2011  . Anxiety 02/27/2012   Past Surgical History  Procedure Date  . Breast reduction surgery   . Tubal ligation   . Dilation and curettage of uterus 07/2010    Hysteroscopy NEG w/Dr Mezer    reports that she has never smoked. She does not have any smokeless tobacco history on file. She reports that she does not drink alcohol or use illicit drugs. family history includes Hypertension in her brother, mother, and sister and Other (age of onset:25) in her mother. No Known Allergies Current Outpatient Prescriptions on File Prior to Visit  Medication Sig Dispense Refill  . levothyroxine (LEVOTHROID) 25 MCG tablet Take 1 tablet (25 mcg total) by mouth daily.  90 tablet  3  . Multiple Vitamin (MULTIVITAMIN WITH MINERALS) TABS Take 1 tablet by mouth daily.      . nadolol-bendroflumethiazide (CORZIDE) 40-5 MG per tablet Take 1 tablet by mouth daily.  90 tablet  3  . sertraline (ZOLOFT) 100 MG tablet Take 150 mg by mouth daily.       . [DISCONTINUED] fexofenadine (ALLEGRA) 180  MG tablet Take 180 mg by mouth daily.         Review of Systems  Constitutional: Negative for diaphoresis and unexpected weight change.  HENT: Negative for tinnitus.   Eyes: Negative for photophobia and visual disturbance.  Respiratory: Negative for choking and stridor.   Gastrointestinal: Negative for vomiting and blood in stool.  Genitourinary: Negative for hematuria and decreased urine volume.  Musculoskeletal: Negative for gait problem.  Skin: Negative for color change and wound.  Neurological: Negative for tremors and numbness.  Psychiatric/Behavioral: Negative for decreased concentration. The patient is not hyperactive.       Objective:   Physical Exam BP 120/72  Pulse 80  Temp 97.9 F (36.6 C) (Oral)  Ht 5\' 4"  (1.626 m)  Wt 318 lb 8 oz (144.471 kg)  BMI 54.67 kg/m2  SpO2 97% Physical Exam  VS noted, mild ill Constitutional: Pt appears well-developed and well-nourished.  HENT: Head: Normocephalic.  Right Ear: External ear normal.  Left Ear: External ear normal.  Bilat tm's mild erythema.  Sinus nontender.  Pharynx mild erythema Eyes: Conjunctivae and EOM are normal. Pupils are equal, round, and reactive to light.  Neck: Normal range of motion. Neck supple.  Cardiovascular: Normal rate and regular rhythm.   Pulmonary/Chest: Effort normal and breath sounds mild decreased, bilat few wheezes.  Neurological: Pt is alert. Not confused  Skin: Skin is warm. No erythema.  Psychiatric: Pt behavior is  normal. Thought content normal.     Assessment & Plan:

## 2012-11-14 NOTE — Assessment & Plan Note (Signed)
stable overall by hx and exam, most recent data reviewed with pt, and pt to continue medical treatment as before BP Readings from Last 3 Encounters:  11/14/12 120/72  10/02/12 110/80  07/02/12 156/96

## 2012-11-14 NOTE — Assessment & Plan Note (Signed)
Mild to mod, for antibx course,  to f/u any worsening symptoms or concerns 

## 2012-11-15 ENCOUNTER — Other Ambulatory Visit: Payer: Self-pay | Admitting: Internal Medicine

## 2012-11-15 ENCOUNTER — Encounter (HOSPITAL_COMMUNITY): Payer: Self-pay | Admitting: *Deleted

## 2012-11-15 ENCOUNTER — Ambulatory Visit: Payer: BC Managed Care – PPO | Admitting: Dietician

## 2012-11-15 NOTE — Telephone Encounter (Signed)
Done hardcopy to robin  

## 2012-11-15 NOTE — Telephone Encounter (Signed)
Faxed hardcopy to pharmacy. 

## 2012-11-17 MED ORDER — DEXTROSE 5 % IV SOLN
3.0000 g | INTRAVENOUS | Status: AC
  Administered 2012-11-18: 3 g via INTRAVENOUS
  Filled 2012-11-17: qty 3000

## 2012-11-17 MED ORDER — CEFAZOLIN SODIUM-DEXTROSE 2-3 GM-% IV SOLR: 2.0000 g | INTRAVENOUS | Status: DC

## 2012-11-18 ENCOUNTER — Encounter (HOSPITAL_COMMUNITY)
Admission: RE | Disposition: A | Discharge status: Home / Self Care | Payer: Self-pay | Source: Ambulatory Visit | Attending: Neurosurgery

## 2012-11-18 ENCOUNTER — Ambulatory Visit (HOSPITAL_COMMUNITY): Payer: BC Managed Care – PPO

## 2012-11-18 ENCOUNTER — Ambulatory Visit (HOSPITAL_COMMUNITY)
Admission: RE | Admit: 2012-11-18 | Discharge: 2012-11-20 | Disposition: A | Discharge status: Home / Self Care | Payer: BC Managed Care – PPO | Source: Ambulatory Visit | Attending: Neurosurgery | Admitting: Neurosurgery

## 2012-11-18 ENCOUNTER — Encounter (HOSPITAL_COMMUNITY): Payer: Self-pay | Admitting: *Deleted

## 2012-11-18 ENCOUNTER — Ambulatory Visit (HOSPITAL_COMMUNITY): Payer: BC Managed Care – PPO | Admitting: Anesthesiology

## 2012-11-18 ENCOUNTER — Encounter (HOSPITAL_COMMUNITY): Payer: Self-pay | Admitting: Anesthesiology

## 2012-11-18 DIAGNOSIS — M5020 Other cervical disc displacement, unspecified cervical region: Secondary | ICD-10-CM | POA: Insufficient documentation

## 2012-11-18 DIAGNOSIS — E039 Hypothyroidism, unspecified: Secondary | ICD-10-CM | POA: Insufficient documentation

## 2012-11-18 DIAGNOSIS — I10 Essential (primary) hypertension: Secondary | ICD-10-CM | POA: Insufficient documentation

## 2012-11-18 DIAGNOSIS — M5030 Other cervical disc degeneration, unspecified cervical region: Secondary | ICD-10-CM | POA: Insufficient documentation

## 2012-11-18 HISTORY — PX: ANTERIOR CERVICAL DECOMP/DISCECTOMY FUSION: SHX1161

## 2012-11-18 HISTORY — DX: Shortness of breath: R06.02

## 2012-11-18 LAB — HCG, SERUM, QUALITATIVE: Preg, Serum: NEGATIVE

## 2012-11-18 LAB — CBC
HCT: 35.8 % — ABNORMAL LOW (ref 36.0–46.0)
Hemoglobin: 12.2 g/dL (ref 12.0–15.0)
MCH: 27.5 pg (ref 26.0–34.0)
MCHC: 34.1 g/dL (ref 30.0–36.0)
MCV: 80.8 fL (ref 78.0–100.0)
Platelets: 335 10*3/uL (ref 150–400)
RBC: 4.43 MIL/uL (ref 3.87–5.11)
RDW: 13.8 % (ref 11.5–15.5)
WBC: 10.8 10*3/uL — ABNORMAL HIGH (ref 4.0–10.5)

## 2012-11-18 LAB — BASIC METABOLIC PANEL
BUN: 14 mg/dL (ref 6–23)
CO2: 31 mEq/L (ref 19–32)
Calcium: 9.1 mg/dL (ref 8.4–10.5)
Chloride: 97 mEq/L (ref 96–112)
Creatinine, Ser: 0.61 mg/dL (ref 0.50–1.10)
GFR calc Af Amer: >90 mL/min (ref >90)
GFR calc non Af Amer: >90 mL/min (ref >90)
Glucose, Bld: 109 mg/dL — ABNORMAL HIGH (ref 70–99)
Potassium: 2.9 mEq/L — ABNORMAL LOW (ref 3.5–5.1)
Sodium: 138 mEq/L (ref 135–145)

## 2012-11-18 LAB — SURGICAL PCR SCREEN
MRSA, PCR: NEGATIVE
Staphylococcus aureus: NEGATIVE

## 2012-11-18 SURGERY — ANTERIOR CERVICAL DECOMPRESSION/DISCECTOMY FUSION 1 LEVEL
Anesthesia: General | Site: Neck | Wound class: Clean

## 2012-11-18 MED ORDER — SODIUM CHLORIDE 0.9 % IR SOLN
Status: DC | PRN
  Administered 2012-11-18: 09:00:00

## 2012-11-18 MED ORDER — KCL IN DEXTROSE-NACL 20-5-0.45 MEQ/L-%-% IV SOLN
INTRAVENOUS | Status: DC
  Filled 2012-11-18 (×8): qty 1000

## 2012-11-18 MED ORDER — OXYCODONE HCL 5 MG/5ML PO SOLN: 5.0000 mg | Freq: Once | ORAL | Status: AC | PRN

## 2012-11-18 MED ORDER — KETOROLAC TROMETHAMINE 30 MG/ML IJ SOLN
30.0000 mg | Freq: Once | INTRAMUSCULAR | Status: AC
  Administered 2012-11-18: 30 mg via INTRAVENOUS

## 2012-11-18 MED ORDER — OXYCODONE HCL 5 MG PO TABS
5.0000 mg | ORAL_TABLET | ORAL | Status: DC | PRN
  Administered 2012-11-18 – 2012-11-20 (×8): 10 mg via ORAL
  Filled 2012-11-18 (×8): qty 2

## 2012-11-18 MED ORDER — HEMOSTATIC AGENTS (NO CHARGE) OPTIME
TOPICAL | Status: DC | PRN
  Administered 2012-11-18: 1 via TOPICAL

## 2012-11-18 MED ORDER — HYDROCODONE-HOMATROPINE 5-1.5 MG/5ML PO SYRP: 5.0000 mL | ORAL_SOLUTION | Freq: Four times a day (QID) | ORAL | Status: DC | PRN

## 2012-11-18 MED ORDER — 0.9 % SODIUM CHLORIDE (POUR BTL) OPTIME
TOPICAL | Status: DC | PRN
  Administered 2012-11-18: 1000 mL

## 2012-11-18 MED ORDER — BACITRACIN 50000 UNITS IM SOLR
INTRAMUSCULAR | Status: AC
  Filled 2012-11-18: qty 1

## 2012-11-18 MED ORDER — NADOLOL-BENDROFLUMETHIAZIDE 40-5 MG PO TABS
1.0000 | ORAL_TABLET | Freq: Every day | ORAL | Status: DC
Start: 2012-11-18 — End: 2012-11-18

## 2012-11-18 MED ORDER — SERTRALINE HCL 50 MG PO TABS
150.0000 mg | ORAL_TABLET | Freq: Every day | ORAL | Status: DC
  Administered 2012-11-19 – 2012-11-20 (×2): 150 mg via ORAL
  Filled 2012-11-18 (×3): qty 1

## 2012-11-18 MED ORDER — ACETAMINOPHEN 10 MG/ML IV SOLN
INTRAVENOUS | Status: AC
  Filled 2012-11-18: qty 100

## 2012-11-18 MED ORDER — MORPHINE SULFATE 4 MG/ML IJ SOLN
4.0000 mg | INTRAMUSCULAR | Status: DC | PRN
  Administered 2012-11-18: 8 mg via INTRAMUSCULAR
  Administered 2012-11-18 – 2012-11-19 (×3): 4 mg via INTRAMUSCULAR
  Administered 2012-11-19: 8 mg via INTRAMUSCULAR
  Filled 2012-11-18 (×2): qty 2
  Filled 2012-11-18 (×3): qty 1

## 2012-11-18 MED ORDER — PHENOL 1.4 % MT LIQD
1.0000 | OROMUCOSAL | Status: DC | PRN
  Administered 2012-11-18: 1 via OROMUCOSAL
  Filled 2012-11-18: qty 177

## 2012-11-18 MED ORDER — ACETAMINOPHEN 10 MG/ML IV SOLN
INTRAVENOUS | Status: DC | PRN
  Administered 2012-11-18: 1000 mg via INTRAVENOUS

## 2012-11-18 MED ORDER — PROPOFOL 10 MG/ML IV BOLUS
INTRAVENOUS | Status: DC | PRN
  Administered 2012-11-18: 200 mg via INTRAVENOUS

## 2012-11-18 MED ORDER — SUFENTANIL CITRATE 50 MCG/ML IV SOLN
INTRAVENOUS | Status: DC | PRN
  Administered 2012-11-18: 5 ug via INTRAVENOUS
  Administered 2012-11-18: 30 ug via INTRAVENOUS
  Administered 2012-11-18: 5 ug via INTRAVENOUS
  Administered 2012-11-18: 10 ug via INTRAVENOUS

## 2012-11-18 MED ORDER — LIDOCAINE HCL (CARDIAC) 20 MG/ML IV SOLN
INTRAVENOUS | Status: DC | PRN
  Administered 2012-11-18: 100 mg via INTRAVENOUS

## 2012-11-18 MED ORDER — ROCURONIUM BROMIDE 100 MG/10ML IV SOLN
INTRAVENOUS | Status: DC | PRN
  Administered 2012-11-18: 50 mg via INTRAVENOUS

## 2012-11-18 MED ORDER — ARTIFICIAL TEARS OP OINT
TOPICAL_OINTMENT | OPHTHALMIC | Status: DC | PRN
  Administered 2012-11-18: 1 via OPHTHALMIC

## 2012-11-18 MED ORDER — NEOSTIGMINE METHYLSULFATE 1 MG/ML IJ SOLN
INTRAMUSCULAR | Status: DC | PRN
  Administered 2012-11-18: 5 mg via INTRAVENOUS

## 2012-11-18 MED ORDER — MENTHOL 3 MG MT LOZG
1.0000 | LOZENGE | OROMUCOSAL | Status: DC | PRN
  Administered 2012-11-18: 3 mg via ORAL
  Filled 2012-11-18 (×2): qty 9

## 2012-11-18 MED ORDER — ACETAMINOPHEN 10 MG/ML IV SOLN
1000.0000 mg | Freq: Four times a day (QID) | INTRAVENOUS | Status: AC
  Administered 2012-11-18 – 2012-11-19 (×4): 1000 mg via INTRAVENOUS
  Filled 2012-11-18 (×4): qty 100

## 2012-11-18 MED ORDER — THROMBIN 5000 UNITS EX SOLR
CUTANEOUS | Status: DC | PRN
  Administered 2012-11-18 (×2): 5000 [IU] via TOPICAL

## 2012-11-18 MED ORDER — CYCLOBENZAPRINE HCL 10 MG PO TABS
10.0000 mg | ORAL_TABLET | Freq: Three times a day (TID) | ORAL | Status: DC | PRN
  Administered 2012-11-18 – 2012-11-20 (×5): 10 mg via ORAL
  Filled 2012-11-18 (×5): qty 1

## 2012-11-18 MED ORDER — LIDOCAINE-EPINEPHRINE 1 %-1:100000 IJ SOLN
INTRAMUSCULAR | Status: DC | PRN
  Administered 2012-11-18: 5 mL

## 2012-11-18 MED ORDER — KETOROLAC TROMETHAMINE 30 MG/ML IJ SOLN
INTRAMUSCULAR | Status: AC
  Filled 2012-11-18: qty 1

## 2012-11-18 MED ORDER — BISACODYL 10 MG RE SUPP: 10.0000 mg | Freq: Every day | RECTAL | Status: DC | PRN

## 2012-11-18 MED ORDER — SODIUM CHLORIDE 0.9 % IJ SOLN
3.0000 mL | Freq: Two times a day (BID) | INTRAMUSCULAR | Status: DC
  Administered 2012-11-18 – 2012-11-19 (×3): 3 mL via INTRAVENOUS

## 2012-11-18 MED ORDER — HYDROMORPHONE HCL PF 1 MG/ML IJ SOLN
INTRAMUSCULAR | Status: AC
  Filled 2012-11-18: qty 1

## 2012-11-18 MED ORDER — DIAZEPAM 5 MG/ML IJ SOLN
INTRAMUSCULAR | Status: AC
  Filled 2012-11-18: qty 2

## 2012-11-18 MED ORDER — DIAZEPAM 5 MG/ML IJ SOLN
2.5000 mg | Freq: Once | INTRAMUSCULAR | Status: AC
  Administered 2012-11-18: 2.5 mg via INTRAVENOUS

## 2012-11-18 MED ORDER — SODIUM CHLORIDE 0.9 % IV SOLN
INTRAVENOUS | Status: AC
  Filled 2012-11-18: qty 500

## 2012-11-18 MED ORDER — VECURONIUM BROMIDE 10 MG IV SOLR
INTRAVENOUS | Status: DC | PRN
  Administered 2012-11-18: 2 mg via INTRAVENOUS

## 2012-11-18 MED ORDER — KETOROLAC TROMETHAMINE 30 MG/ML IJ SOLN
30.0000 mg | Freq: Four times a day (QID) | INTRAMUSCULAR | Status: DC
  Administered 2012-11-18 – 2012-11-19 (×5): 30 mg via INTRAVENOUS
  Filled 2012-11-18 (×8): qty 1

## 2012-11-18 MED ORDER — GLYCOPYRROLATE 0.2 MG/ML IJ SOLN
INTRAMUSCULAR | Status: DC | PRN
  Administered 2012-11-18: .8 mg via INTRAVENOUS

## 2012-11-18 MED ORDER — LEVOTHYROXINE SODIUM 25 MCG PO TABS
25.0000 ug | ORAL_TABLET | Freq: Every day | ORAL | Status: DC
  Administered 2012-11-19 – 2012-11-20 (×2): 25 ug via ORAL
  Filled 2012-11-18 (×3): qty 1

## 2012-11-18 MED ORDER — HYDROXYZINE HCL 25 MG PO TABS: 50.0000 mg | ORAL_TABLET | ORAL | Status: DC | PRN

## 2012-11-18 MED ORDER — ONDANSETRON HCL 4 MG/2ML IJ SOLN
INTRAMUSCULAR | Status: DC | PRN
  Administered 2012-11-18: 4 mg via INTRAVENOUS

## 2012-11-18 MED ORDER — MAGNESIUM HYDROXIDE 400 MG/5ML PO SUSP: 30.0000 mL | Freq: Every day | ORAL | Status: DC | PRN

## 2012-11-18 MED ORDER — ALUM & MAG HYDROXIDE-SIMETH 200-200-20 MG/5ML PO SUSP: 30.0000 mL | Freq: Four times a day (QID) | ORAL | Status: DC | PRN

## 2012-11-18 MED ORDER — CABERGOLINE 0.5 MG PO TABS: 0.2500 mg | ORAL_TABLET | ORAL | Status: DC

## 2012-11-18 MED ORDER — BUPIVACAINE HCL (PF) 0.5 % IJ SOLN
INTRAMUSCULAR | Status: DC | PRN
  Administered 2012-11-18: 5 mL

## 2012-11-18 MED ORDER — SODIUM CHLORIDE 0.9 % IJ SOLN: 3.0000 mL | INTRAMUSCULAR | Status: DC | PRN

## 2012-11-18 MED ORDER — ACETAMINOPHEN 325 MG PO TABS: 650.0000 mg | ORAL_TABLET | ORAL | Status: DC | PRN

## 2012-11-18 MED ORDER — HYDROXYZINE HCL 50 MG/ML IM SOLN: 50.0000 mg | INTRAMUSCULAR | Status: DC | PRN

## 2012-11-18 MED ORDER — MIDAZOLAM HCL 5 MG/5ML IJ SOLN
INTRAMUSCULAR | Status: DC | PRN
  Administered 2012-11-18: 2 mg via INTRAVENOUS

## 2012-11-18 MED ORDER — MUPIROCIN 2 % EX OINT
TOPICAL_OINTMENT | CUTANEOUS | Status: AC
  Filled 2012-11-18: qty 22

## 2012-11-18 MED ORDER — ACETAMINOPHEN 650 MG RE SUPP: 650.0000 mg | RECTAL | Status: DC | PRN

## 2012-11-18 MED ORDER — MUPIROCIN 2 % EX OINT
TOPICAL_OINTMENT | Freq: Once | CUTANEOUS | Status: DC
  Filled 2012-11-18: qty 22

## 2012-11-18 MED ORDER — LACTATED RINGERS IV SOLN
INTRAVENOUS | Status: DC | PRN
  Administered 2012-11-18: 07:00:00 via INTRAVENOUS

## 2012-11-18 MED ORDER — HYDROMORPHONE HCL PF 1 MG/ML IJ SOLN
0.2500 mg | INTRAMUSCULAR | Status: DC | PRN
  Administered 2012-11-18 (×4): 0.5 mg via INTRAVENOUS

## 2012-11-18 MED ORDER — OXYCODONE HCL 5 MG PO TABS
5.0000 mg | ORAL_TABLET | Freq: Once | ORAL | Status: AC | PRN
Start: 2012-11-18 — End: 2012-11-18
  Administered 2012-11-18: 5 mg via ORAL

## 2012-11-18 MED ORDER — PROMETHAZINE HCL 25 MG/ML IJ SOLN: 6.2500 mg | INTRAMUSCULAR | Status: DC | PRN

## 2012-11-18 MED ORDER — OXYCODONE HCL 5 MG PO TABS
ORAL_TABLET | ORAL | Status: AC
  Filled 2012-11-18: qty 1

## 2012-11-18 SURGICAL SUPPLY — 58 items
ADH SKN CLS APL DERMABOND .7 (GAUZE/BANDAGES/DRESSINGS)
ADH SKN CLS LQ APL DERMABOND (GAUZE/BANDAGES/DRESSINGS) ×1
BAG DECANTER FOR FLEXI CONT (MISCELLANEOUS) ×2 IMPLANT
BIT DRILL NEURO 2X3.1 SFT TUCH (MISCELLANEOUS) ×1 IMPLANT
BLADE ULTRA TIP 2M (BLADE) ×2 IMPLANT
BRUSH SCRUB EZ PLAIN DRY (MISCELLANEOUS) ×2 IMPLANT
CANISTER SUCTION 2500CC (MISCELLANEOUS) ×2 IMPLANT
CLOTH BEACON ORANGE TIMEOUT ST (SAFETY) ×2 IMPLANT
CONT SPEC 4OZ CLIKSEAL STRL BL (MISCELLANEOUS) ×2 IMPLANT
COVER MAYO STAND STRL (DRAPES) ×2 IMPLANT
DECANTER SPIKE VIAL GLASS SM (MISCELLANEOUS) ×2 IMPLANT
DERMABOND ADHESIVE PROPEN (GAUZE/BANDAGES/DRESSINGS) ×1
DERMABOND ADVANCED (GAUZE/BANDAGES/DRESSINGS)
DERMABOND ADVANCED .7 DNX12 (GAUZE/BANDAGES/DRESSINGS) ×1 IMPLANT
DERMABOND ADVANCED .7 DNX6 (GAUZE/BANDAGES/DRESSINGS) IMPLANT
DRAPE LAPAROTOMY 100X72 PEDS (DRAPES) ×2 IMPLANT
DRAPE MICROSCOPE LEICA (MISCELLANEOUS) ×2 IMPLANT
DRAPE POUCH INSTRU U-SHP 10X18 (DRAPES) ×2 IMPLANT
DRAPE PROXIMA HALF (DRAPES) IMPLANT
DRILL NEURO 2X3.1 SOFT TOUCH (MISCELLANEOUS) ×2
ELECT COATED BLADE 2.86 ST (ELECTRODE) ×2 IMPLANT
ELECT REM PT RETURN 9FT ADLT (ELECTROSURGICAL) ×2
ELECTRODE REM PT RTRN 9FT ADLT (ELECTROSURGICAL) ×1 IMPLANT
GLOVE BIO SURGEON STRL SZ8.5 (GLOVE) ×1 IMPLANT
GLOVE BIOGEL PI IND STRL 8 (GLOVE) ×1 IMPLANT
GLOVE BIOGEL PI INDICATOR 8 (GLOVE) ×2
GLOVE ECLIPSE 7.5 STRL STRAW (GLOVE) ×4 IMPLANT
GLOVE EXAM NITRILE LRG STRL (GLOVE) IMPLANT
GLOVE EXAM NITRILE MD LF STRL (GLOVE) ×1 IMPLANT
GLOVE EXAM NITRILE XL STR (GLOVE) IMPLANT
GLOVE EXAM NITRILE XS STR PU (GLOVE) IMPLANT
GLOVE SS BIOGEL STRL SZ 8 (GLOVE) IMPLANT
GLOVE SUPERSENSE BIOGEL SZ 8 (GLOVE) ×1
GOWN BRE IMP SLV AUR LG STRL (GOWN DISPOSABLE) IMPLANT
GOWN BRE IMP SLV AUR XL STRL (GOWN DISPOSABLE) ×3 IMPLANT
GOWN STRL REIN 2XL LVL4 (GOWN DISPOSABLE) IMPLANT
GRAFT CORT CANC 14X8.25X11 5D (Bone Implant) ×1 IMPLANT
HEAD HALTER (SOFTGOODS) ×2 IMPLANT
KIT BASIN OR (CUSTOM PROCEDURE TRAY) ×2 IMPLANT
KIT ROOM TURNOVER OR (KITS) ×2 IMPLANT
NDL HYPO 25X1 1.5 SAFETY (NEEDLE) ×1 IMPLANT
NDL SPNL 22GX3.5 QUINCKE BK (NEEDLE) ×1 IMPLANT
NEEDLE HYPO 25X1 1.5 SAFETY (NEEDLE) ×2 IMPLANT
NEEDLE SPNL 22GX3.5 QUINCKE BK (NEEDLE) ×4 IMPLANT
NS IRRIG 1000ML POUR BTL (IV SOLUTION) ×2 IMPLANT
PACK LAMINECTOMY NEURO (CUSTOM PROCEDURE TRAY) ×2 IMPLANT
PAD ARMBOARD 7.5X6 YLW CONV (MISCELLANEOUS) ×6 IMPLANT
RUBBERBAND STERILE (MISCELLANEOUS) ×4 IMPLANT
SPONGE GAUZE 4X4 12PLY (GAUZE/BANDAGES/DRESSINGS) ×1 IMPLANT
SPONGE INTESTINAL PEANUT (DISPOSABLE) ×2 IMPLANT
SPONGE SURGIFOAM ABS GEL SZ50 (HEMOSTASIS) ×2 IMPLANT
SUT VIC AB 2-0 CP2 18 (SUTURE) ×2 IMPLANT
SUT VIC AB 3-0 SH 8-18 (SUTURE) ×2 IMPLANT
SYR 20ML ECCENTRIC (SYRINGE) ×2 IMPLANT
TAPE CLOTH SURG 4X10 WHT LF (GAUZE/BANDAGES/DRESSINGS) ×1 IMPLANT
TOWEL OR 17X24 6PK STRL BLUE (TOWEL DISPOSABLE) IMPLANT
TOWEL OR 17X26 10 PK STRL BLUE (TOWEL DISPOSABLE) ×2 IMPLANT
WATER STERILE IRR 1000ML POUR (IV SOLUTION) ×2 IMPLANT

## 2012-11-18 NOTE — Progress Notes (Signed)
Filed Vitals:   11/18/12 1027 11/18/12 1030 11/18/12 1036 11/18/12 1109  BP: 122/64 122/64  139/79  Pulse: 71 72  81  Temp:   97.1 F (36.2 C) 98.7 F (37.1 C)  TempSrc:      Resp: 15 16  18   SpO2: 98% 97%  94%    CBC  Basename 11/18/12 0645  WBC 10.8*  HGB 12.2  HCT 35.8*  PLT 335   BMET  Basename 11/18/12 0645  NA 138  K 2.9*  CL 97  CO2 31  GLUCOSE 109*  BUN 14  CREATININE 0.61  CALCIUM 9.1    Patient resting in bed, has ambulate in the halls, has voided. Wound clean and dry. Moving all 4 extremities well. Moderate pain.  Plan: Continue progress to postoperative recovery.  Hewitt Shorts, MD 11/18/2012, 4:00 PM

## 2012-11-18 NOTE — Addendum Note (Signed)
Addendum  created 11/18/12 1059 by Patrick North, CRNA   Modules edited:Anesthesia Medication Administration

## 2012-11-18 NOTE — Progress Notes (Signed)
Pt c/o itching on chest where wiped with chg. Cool wet washcloth given to patient to wipe chest.  She states it feels better and no longer itching.

## 2012-11-18 NOTE — Addendum Note (Signed)
Addendum  created 11/18/12 1059 by Kiandria Clum R Meral Geissinger, CRNA   Modules edited:Anesthesia Medication Administration    

## 2012-11-18 NOTE — Plan of Care (Signed)
Problem: Consults Goal: Diagnosis - Spinal Surgery Outcome: Completed/Met Date Met:  11/18/12 Cervical Spine Fusion

## 2012-11-18 NOTE — Anesthesia Preprocedure Evaluation (Addendum)
Anesthesia Evaluation  Patient identified by MRN, date of birth, ID band Patient awake    Reviewed: Allergy & Precautions, H&P , NPO status , Patient's Chart, lab work & pertinent test results, reviewed documented beta blocker date and time   Airway Mallampati: II TM Distance: >3 FB Neck ROM: Full    Dental  (+) Teeth Intact and Dental Advisory Given   Pulmonary neg pulmonary ROS,    Pulmonary exam normal       Cardiovascular hypertension, Pt. on medications     Neuro/Psych    GI/Hepatic negative GI ROS, Neg liver ROS,   Endo/Other  Hypothyroidism Morbid obesity  Renal/GU negative Renal ROS     Musculoskeletal   Abdominal   Peds  Hematology   Anesthesia Other Findings   Reproductive/Obstetrics                          Anesthesia Physical Anesthesia Plan  ASA: III  Anesthesia Plan: General   Post-op Pain Management:    Induction: Intravenous  Airway Management Planned: Oral ETT  Additional Equipment:   Intra-op Plan:   Post-operative Plan: Extubation in OR  Informed Consent: I have reviewed the patients History and Physical, chart, labs and discussed the procedure including the risks, benefits and alternatives for the proposed anesthesia with the patient or authorized representative who has indicated his/her understanding and acceptance.   Dental advisory given  Plan Discussed with: CRNA, Anesthesiologist and Surgeon  Anesthesia Plan Comments:         Anesthesia Quick Evaluation

## 2012-11-18 NOTE — Transfer of Care (Signed)
Immediate Anesthesia Transfer of Care Note  Patient: Chelsea Thompson  Procedure(s) Performed: Procedure(s) (LRB) with comments: ANTERIOR CERVICAL DECOMPRESSION/DISCECTOMY FUSION 1 LEVEL (N/A) - Cervical five-six Anterior cervical decompression/diskectomy/fusion   Patient Location: PACU  Anesthesia Type:General  Level of Consciousness: awake, alert , oriented and patient cooperative  Airway & Oxygen Therapy: Patient Spontanous Breathing and Patient connected to nasal cannula oxygen  Post-op Assessment: Report given to PACU RN, Post -op Vital signs reviewed and stable and Patient moving all extremities X 4  Post vital signs: Reviewed and stable  Complications: No apparent anesthesia complications

## 2012-11-18 NOTE — Anesthesia Postprocedure Evaluation (Signed)
Anesthesia Post Note  Patient: Chelsea Thompson  Procedure(s) Performed: Procedure(s) (LRB): ANTERIOR CERVICAL DECOMPRESSION/DISCECTOMY FUSION 1 LEVEL (N/A)  Anesthesia type: general  Patient location: PACU  Post pain: Pain level controlled  Post assessment: Patient's Cardiovascular Status Stable  Last Vitals:  Filed Vitals:   11/18/12 1030  BP: 122/64  Pulse: 72  Temp:   Resp: 16    Post vital signs: Reviewed and stable  Level of consciousness: sedated  Complications: No apparent anesthesia complications

## 2012-11-18 NOTE — H&P (Signed)
Subjective: Chelsea Thompson is a 41 y.o. female who is admitted for treatment of a large central to right C5-6 cervical disc herniation with resulting cervical radiculopathy. Chelsea Thompson presented with right pectoral pain, that extends into the right shoulder, around the right scapula, and down into the right arm, with numbness and tingling extending to the right forearm, into the right hand and digits. Chelsea Thompson was treated with medications without relief. She was studied with x-rays and MRI scan. She is admitted now for single level C5-6 anterior cervical decompression and arthrodesis.    Chelsea Thompson Active Problem List   Diagnosis Date Noted  . Acute bronchitis 11/14/2012  . Wheezing 11/14/2012  . Right shoulder pain 10/06/2012  . Neck pain on right side 10/06/2012  . Fatigue 10/02/2012  . Chest pain 07/01/2012  . Abdominal tenderness, other specified site 04/05/2012  . Wrist pain, right 02/27/2012  . Nausea 02/27/2012  . Anxiety 02/27/2012  . Impaired glucose tolerance 10/04/2011  . Preventative health care 10/04/2011  . HYPERLIPIDEMIA 02/08/2011  . DEPRESSION 02/27/2010  . ALLERGIC RHINITIS 02/27/2010  . HYPOTHYROIDISM 12/28/2008  . HYPERTENSION 12/28/2008  . DYSPNEA 09/23/2008  . PITUITARY ADENOMA 09/22/2008  . OBESITY 09/22/2008   Past Medical History  Diagnosis Date  . Pituitary adenoma   . Obesity   . Hypothyroidism     DR Lurene Shadow  . HTN (hypertension)   . Glucose intolerance (impaired glucose tolerance)   . Allergic rhinitis   . Depression   . Hyperlipidemia   . Impaired glucose tolerance 10/04/2011  . Anxiety 02/27/2012  . Shortness of breath     Past Surgical History  Procedure Date  . Breast reduction surgery   . Tubal ligation   . Dilation and curettage of uterus 07/2010    Hysteroscopy NEG w/Dr Mezer    Prescriptions prior to admission  Medication Sig Dispense Refill  . azithromycin (ZITHROMAX Z-PAK) 250 MG tablet Use as directed  6 each  1  . cabergoline (DOSTINEX) 0.5  MG tablet Take 0.5 tablets (0.25 mg total) by mouth 2 (two) times a week. Monday and thursday  24 tablet  3  . HYDROcodone-homatropine (HYCODAN) 5-1.5 MG/5ML syrup TAKE ONE TEASPOONFUL BY MOUTH EVERY 6 HOURS AS NEEDED FOR COUGH  120 mL  0  . levothyroxine (LEVOTHROID) 25 MCG tablet Take 1 tablet (25 mcg total) by mouth daily.  90 tablet  3  . Multiple Vitamin (MULTIVITAMIN WITH MINERALS) TABS Take 1 tablet by mouth daily.      . nadolol-bendroflumethiazide (CORZIDE) 40-5 MG per tablet Take 1 tablet by mouth daily.  90 tablet  3  . predniSONE (DELTASONE) 10 MG tablet 2 tabs by mouth per day for 5 days  10 tablet  0  . sertraline (ZOLOFT) 100 MG tablet Take 150 mg by mouth daily.       . traMADol (ULTRAM) 50 MG tablet Take 1 tablet (50 mg total) by mouth every 6 (six) hours as needed. For pain  120 tablet  0   No Known Allergies  History  Substance Use Topics  . Smoking status: Never Smoker   . Smokeless tobacco: Not on file  . Alcohol Use: No    Family History  Problem Relation Age of Onset  . Other Mother 25    MVA   . Hypertension Mother   . Hypertension Sister   . Hypertension Brother      Review of Systems A comprehensive review of systems was negative.  Objective: Vital signs in last 24 hours:  Temp:  [98.1 F (36.7 C)] 98.1 F (36.7 C) (12/09 0622) Pulse Rate:  [68] 68  (12/09 0622) Resp:  [18] 18  (12/09 0622) BP: (147)/(86) 147/86 mmHg (12/09 0622) SpO2:  [93 %] 93 % (12/09 0622)  EXAM: Chelsea Thompson is obese black female in no acute distress. Lungs are clear to auscultation , the Chelsea Thompson has symmetrical respiratory excursion. Heart has a regular rate and rhythm normal S1 and S2 no murmur.   Abdomen is soft nontender nondistended bowel sounds are present. Extremity examination shows no clubbing cyanosis or edema. Musculoskeletal examination shows tenderness to palpation over the right paracervical region, not over the cervical spinous processes nor over the left paracervical  region. Range of motion causes pain with extension and lateral flexion to the right. She has little discomfort with forward flexion and lateral flexion to the left. Motor examination shows 5 over 5 strength in the upper extremities including the deltoid biceps triceps and intrinsics and grip. Sensation is intact to pinprick throughout the digits of the upper extremities. Reflexes are symmetrical and without evidence of pathologic reflexes. Chelsea Thompson has a normal gait and stance.   Data Review:CBC    Component Value Date/Time   WBC 10.8* 11/18/2012 0645   RBC 4.43 11/18/2012 0645   HGB 12.2 11/18/2012 0645   HCT 35.8* 11/18/2012 0645   PLT 335 11/18/2012 0645   MCV 80.8 11/18/2012 0645   MCH 27.5 11/18/2012 0645   MCHC 34.1 11/18/2012 0645   RDW 13.8 11/18/2012 0645   LYMPHSABS 2.2 10/02/2012 1209   MONOABS 0.6 10/02/2012 1209   EOSABS 0.3 10/02/2012 1209   BASOSABS 0.0 10/02/2012 1209                          BMET    Component Value Date/Time   NA 136 10/02/2012 1209   K 3.3* 10/02/2012 1209   CL 98 10/02/2012 1209   CO2 31 10/02/2012 1209   GLUCOSE 88 10/02/2012 1209   BUN 12 10/02/2012 1209   CREATININE 0.7 10/02/2012 1209   CALCIUM 8.9 10/02/2012 1209   GFRNONAA >90 07/02/2012 0730   GFRAA >90 07/02/2012 0730     Assessment/Plan: Chelsea Thompson with the right cervical radiculopathy secondary to a central to right C5-6 cervical disc herniation, was admitted for single level C5-6 anterior cervical decompression and arthrodesis.  I've discussed with the Chelsea Thompson the nature of his condition, the nature the surgical procedure, the typical length of surgery, hospital stay, and overall recuperation. We discussed limitations postoperatively. I discussed risks of surgery including risks of infection, bleeding, possibly need for transfusion, the risk of nerve root dysfunction with pain, weakness, numbness, or paresthesias, the risk of spinal cord dysfunction with paralysis of all 4 limbs and quadriplegia,  and the risk of dural tear and CSF leakage and possible need for further surgery, the risk of esophageal dysfunction causing dysphagia and the risk of laryngeal dysfunction causing hoarseness of the voice, the risk of failure of the arthrodesis and the possible need for further surgery, and the risk of anesthetic complications including myocardial infarction, stroke, pneumonia, and death. We also discussed the need for postoperative immobilization in a cervical collar. Understanding all this the Chelsea Thompson does wish to proceed with surgery and is admitted for such.    Hewitt Shorts, MD 11/18/2012 7:16 AM

## 2012-11-18 NOTE — Op Note (Signed)
11/18/2012  9:40 AM  PATIENT:  Chelsea Thompson  41 y.o. female  PRE-OPERATIVE DIAGNOSIS:  C5-6 Cervical herniated nucleus pulposus without myelopathy, Cervical degenerative disc disease, Cervical spondylosis without myelopathy, Cervical radiculopathy  POST-OPERATIVE DIAGNOSIS:  C5-6 Cervical herniated nucleus pulposus without myelopathy, Cervical degenerative disc disease, Cervical spondylosis without myelopathy, Cervical radiculopathy  PROCEDURE:  Procedure(s): ANTERIOR CERVICAL DECOMPRESSION/DISCECTOMY FUSION 1 LEVEL: C5-6 anterior cervical decompression and arthrodesis with allograft and tether cervical plating  SURGEON:  Surgeon(s): Hewitt Shorts, MD Cristi Loron, MD  ASSISTANTS: Tressie Stalker, M.D.  ANESTHESIA:   general  EBL:  Total I/O In: 1000 [I.V.:1000] Out: 25 [Blood:25]  BLOOD ADMINISTERED:none  COUNT: Correct per nursing staff  DICTATION: Patient was brought to the operating room placed under general endotracheal anesthesia. Patient was placed in 10 pounds of halter traction. The neck was prepped with Betadine soap and solution and draped in a sterile fashion. A horizontal incision was made on the left side of the neck. The line of the incision was infiltrated with local anesthetic with epinephrine. Dissection was carried down thru the subcutaneous tissue and platysma, bipolar cautery was used to maintain hemostasis. Dissection was then carried down thru an avascular plane leaving the sternocleidomastoid carotid artery and jugular vein laterally and the trachea and esophagus medially. The ventral aspect of the vertebral column was identified and a localizing x-ray was taken. The C5-6 level was identified. The annulus was incised and the disc space entered. Discectomy was performed with micro-curettes and pituitary rongeurs. The operating microscope was draped and brought into the field provided additional magnification illumination and visualization. Discectomy  was continued posteriorly thru the disc space and then the cartilaginous endplate was removed using micro-curettes along with the high-speed drill. Posterior osteophytic overgrowth was removed using the high-speed drill along with a 2 mm thin footplated Kerrison punch. Posterior longitudinal ligament along with disc herniation was carefully removed, decompressing the spinal canal and thecal sac. We then continued to remove osteophytic overgrowth and disc material decompressing the neural foramina and exiting nerve roots bilaterally. Once the decompression was completed hemostasis was established with the use of Gelfoam with thrombin and bipolar cautery. The Gelfoam was removed the wound irrigated and hemostasis confirmed. We then measured the height of the intravertebral disc space and selected a 8 millimeter in height structural allograft. It was hydrated and saline solution and then gently positioned in the intravertebral disc space and countersunk. We then selected a 14 millimeter in height Tether cervical plate. It was positioned over the fusion construct and secured to the vertebra with 4 x 15 mm fixed screws at the C5 level, and 4 x 15 mm variable screws at the C6 level. Each screw hole was started with the high-speed drill and then the screws placed once all the screws were placed final tightening was performed. The wound was irrigated with bacitracin solution checked for hemostasis which was established and confirmed. An x-ray was taken which did not visualize the fusion construct due to the patient's large size, however under direct visualization the overall fusion construct looked good. We then proceeded with closure. The platysma was closed with interrupted inverted 2-0 undyed Vicryl suture, the subcutaneous and subcuticular closed with interrupted inverted 3-0 undyed Vicryl suture. The skin edges were approximated with Dermabond. Following surgery the patient was taken out of cervical traction. To be  reversed and the anesthetic and taken to the recovery room for further care.   PLAN OF CARE: Admit for overnight observation  PATIENT DISPOSITION:  PACU - hemodynamically stable.   Delay start of Pharmacological VTE agent (>24hrs) due to surgical blood loss or risk of bleeding:  yes

## 2012-11-19 ENCOUNTER — Encounter (HOSPITAL_COMMUNITY): Payer: Self-pay | Admitting: Neurosurgery

## 2012-11-19 NOTE — Progress Notes (Signed)
Filed Vitals:   11/18/12 1636 11/18/12 2000 11/19/12 0000 11/19/12 0410  BP: 149/70 103/51 105/66 108/67  Pulse: 66 62 61 62  Temp: 98.4 F (36.9 C) 97.7 F (36.5 C) 97.6 F (36.4 C) 97.8 F (36.6 C)  TempSrc:  Oral Oral Oral  Resp: 18 18 16 18   SpO2: 94% 100% 99% 93%    CBC  Basename 11/18/12 0645  WBC 10.8*  HGB 12.2  HCT 35.8*  PLT 335   BMET  Basename 11/18/12 0645  NA 138  K 2.9*  CL 97  CO2 31  GLUCOSE 109*  BUN 14  CREATININE 0.61  CALCIUM 9.1    Patient with moderate pain, and very limited mobility. Wound clean and dry, no underlying swelling. Moving all extremities well.  Plan: Encouraged to ambulate at least 5-6 times during the day and evening in the halls the unit.  Hewitt Shorts, MD 11/19/2012, 7:20 AM

## 2012-11-19 NOTE — Progress Notes (Signed)
Utilization review completed. Alline Pio, RN, BSN. 

## 2012-11-20 MED ORDER — OXYCODONE-ACETAMINOPHEN 5-325 MG PO TABS
1.0000 | ORAL_TABLET | ORAL | Status: DC | PRN
  Administered 2012-11-20: 2 via ORAL
  Filled 2012-11-20: qty 2

## 2012-11-20 MED ORDER — OXYCODONE-ACETAMINOPHEN 5-325 MG PO TABS: 1.0000 | ORAL_TABLET | ORAL | Status: DC | PRN

## 2012-11-20 NOTE — Discharge Summary (Signed)
Physician Discharge Summary  Patient ID: Chelsea Thompson MRN: [id number] DOB/AGE: [birth year] 40 y.o.  Admit date: 11/18/2012 Discharge date: 11/20/2012  Admission Diagnoses:    C5-6 cervical disc herniation, cervical degenerative disc disease, cervical spondylosis, cervical radiculopathy   Discharge Diagnoses:    C5-6 cervical disc herniation, cervical degenerative disc disease, cervical spondylosis, cervical radiculopathy  Discharged Condition: good  Hospital Course:   Patient was admitted underwent a single level C5-6 anterior cervical decompression arthrodesis. Initially she had a significant amount of discomfort. As she increased her ambulation she became more comfortable. Her incision is healing nicely. We are discharging her to home with instructions regarding wound care and activities. She is to return for followup with me in 3 weeks.   Discharge Exam: Blood pressure 134/77, pulse 88, temperature 98.7 F (37.1 C), temperature source Oral, resp. rate 20, last menstrual period 10/29/2012, SpO2 92.00%.  Disposition:  home   Discharge Orders    Future Appointments: Provider: Department: Dept Phone: Center:   12/02/2012 11:45 AM Kevan Mellendick, RD [facility] [phone] NDM       Medication List     As of 11/20/2012  8:24 AM    TAKE these medications         azithromycin 250 MG tablet   Commonly known as: ZITHROMAX   Use as directed      cabergoline 0.5 MG tablet   Commonly known as: DOSTINEX   Take 0.5 tablets (0.25 mg total) by mouth 2 (two) times a week. Monday and thursday      HYDROcodone-homatropine 5-1.5 MG/5ML syrup   Commonly known as: HYCODAN   TAKE ONE TEASPOONFUL BY MOUTH EVERY 6 HOURS AS NEEDED FOR COUGH      levothyroxine 25 MCG tablet   Commonly known as: SYNTHROID, LEVOTHROID   Take 1 tablet (25 mcg total) by mouth daily.      multivitamin with minerals Tabs   Take 1 tablet by mouth daily.     nadolol-bendroflumethiazide 40-5 MG per tablet   Commonly known as: CORZIDE   Take 1 tablet by mouth daily.      oxyCODONE-acetaminophen 5-325 MG per tablet   Commonly known as: PERCOCET/ROXICET   Take 1-2 tablets by mouth every 4 (four) hours as needed for pain.      predniSONE 10 MG tablet   Commonly known as: DELTASONE   2 tabs by mouth per day for 5 days      sertraline 100 MG tablet   Commonly known as: ZOLOFT   Take 150 mg by mouth daily.      traMADol 50 MG tablet   Commonly known as: ULTRAM   Take 1 tablet (50 mg total) by mouth every 6 (six) hours as needed. For pain         Signed: Hewitt Shorts, MD 11/20/2012, 8:24 AM

## 2012-12-02 ENCOUNTER — Ambulatory Visit: Payer: BC Managed Care – PPO | Admitting: Dietician

## 2013-01-08 ENCOUNTER — Other Ambulatory Visit: Payer: Self-pay | Admitting: Internal Medicine

## 2013-01-09 MED ORDER — NADOLOL-BENDROFLUMETHIAZIDE 40-5 MG PO TABS: 1.0000 | ORAL_TABLET | Freq: Every day | ORAL | Status: DC

## 2013-01-29 ENCOUNTER — Encounter: Payer: Self-pay | Admitting: Internal Medicine

## 2013-02-03 ENCOUNTER — Encounter: Payer: Self-pay | Admitting: Internal Medicine

## 2013-05-09 ENCOUNTER — Other Ambulatory Visit: Payer: Self-pay | Admitting: Internal Medicine

## 2013-05-13 ENCOUNTER — Other Ambulatory Visit: Payer: Self-pay | Admitting: Internal Medicine

## 2013-05-14 NOTE — Telephone Encounter (Signed)
Faxed hardcopy to [facility] [address] [place] 

## 2013-05-14 NOTE — Telephone Encounter (Signed)
Done hardcopy to robin  

## 2013-07-16 ENCOUNTER — Other Ambulatory Visit: Payer: Self-pay | Admitting: *Deleted

## 2013-07-16 MED ORDER — CABERGOLINE 0.5 MG PO TABS: 0.2500 mg | ORAL_TABLET | ORAL | Status: DC

## 2013-07-17 ENCOUNTER — Other Ambulatory Visit: Payer: Self-pay

## 2013-07-17 MED ORDER — CABERGOLINE 0.5 MG PO TABS: 0.2500 mg | ORAL_TABLET | ORAL | Status: DC

## 2013-07-17 NOTE — Telephone Encounter (Signed)
Done erx 

## 2013-07-22 LAB — HM MAMMOGRAPHY: HM Mammogram: NEGATIVE

## 2013-07-23 ENCOUNTER — Ambulatory Visit (INDEPENDENT_AMBULATORY_CARE_PROVIDER_SITE_OTHER): Payer: BC Managed Care – PPO | Admitting: Internal Medicine

## 2013-07-23 ENCOUNTER — Encounter: Payer: Self-pay | Admitting: Internal Medicine

## 2013-07-23 VITALS — BP 132/80 | HR 91 | Temp 97.2°F | Ht 64.0 in | Wt 328.0 lb

## 2013-07-23 DIAGNOSIS — E669 Obesity, unspecified: Secondary | ICD-10-CM

## 2013-07-23 DIAGNOSIS — F419 Anxiety disorder, unspecified: Secondary | ICD-10-CM

## 2013-07-23 DIAGNOSIS — F411 Generalized anxiety disorder: Secondary | ICD-10-CM

## 2013-07-23 DIAGNOSIS — E049 Nontoxic goiter, unspecified: Secondary | ICD-10-CM | POA: Insufficient documentation

## 2013-07-23 DIAGNOSIS — M542 Cervicalgia: Secondary | ICD-10-CM

## 2013-07-23 MED ORDER — CYCLOBENZAPRINE HCL 5 MG PO TABS: 5.0000 mg | ORAL_TABLET | Freq: Three times a day (TID) | ORAL | Status: DC | PRN

## 2013-07-23 MED ORDER — NAPROXEN 500 MG PO TABS: 500.0000 mg | ORAL_TABLET | Freq: Two times a day (BID) | ORAL | Status: DC | PRN

## 2013-07-23 MED ORDER — PHENTERMINE HCL 37.5 MG PO CAPS: 37.5000 mg | ORAL_CAPSULE | ORAL | Status: DC

## 2013-07-23 MED ORDER — TRAMADOL HCL 50 MG PO TABS: 50.0000 mg | ORAL_TABLET | Freq: Three times a day (TID) | ORAL | Status: DC | PRN

## 2013-07-23 NOTE — Assessment & Plan Note (Signed)
Ok for thyroid u/s . to f/u any worsening symptoms or concerns

## 2013-07-23 NOTE — Assessment & Plan Note (Addendum)
Ok for rx nsaid, with tramadol prn breakthrough, /fu NS as planned  Note:  Total time for pt hx, exam, review of record with pt in the room, determination of diagnoses and plan for further eval and tx is > 40 min, with over 50% spent in coordination and counseling of patient

## 2013-07-23 NOTE — Assessment & Plan Note (Signed)
Ok for phentermine trial,  to f/u any worsening symptoms or concerns 

## 2013-07-23 NOTE — Assessment & Plan Note (Signed)
Overall stable, The current medical regimen is effective;  continue present plan and medications.

## 2013-07-23 NOTE — Patient Instructions (Signed)
Please take all new medication as prescribed - the antiinflammatory, tramadol, and phentermine (for wt) as discussed Please continue all other medications as before, and refills have been done if requested. Please have the pharmacy call with any other refills you may need. You will be contacted regarding the referral for: thyroid ultrasound  Please remember to sign up for My Chart if you have not done so, as this will be important to you in the future with finding out test results, communicating by private email, and scheduling acute appointments online when needed.

## 2013-07-23 NOTE — Progress Notes (Signed)
Subjective:    Patient ID: Chelsea Thompson, female    DOB: 06/29/1971, 42 y.o.   MRN: [id number]  HPI Here to f/u - is S/p cervical surgury dec 2013 with ant approach but results "not healing" per pt, may need bone growth stimulator for 3 mo, but if not improving may need post approach further surgury.  Pain persistent though much improved form prior to dec 2013.  When looks up can get sensation of something in the back of the throat, voice change, and locking sensation so has to help herself look downwards.  Also with probable thyroid enlargement in last 6 mo, strong FH thyroid dz including graves.  Denies hyper or hypo thyroid symptoms such as voice, skin or hair change.  Going to integrative therapy as well three times per wk for PT per Dr Newell Coral.  Not taking any pain meds but taking quite a few OTC including alleve and tylenol, asks for nsaid.  Non smoker.  Getting muscle relaxer per NS.  Denies worsening depressive symptoms, suicidal ideation, or panic; has ongoing anxiety, but ok with continuning current dose.  Pt denies chest pain, increased sob or doe, wheezing, orthopnea, PND, increased LE swelling, palpitations, dizziness or syncope.   Pt denies polydipsia, polyuria.  Sees Dr Cherly Hensen for GYN. Gained 10 lbs in 8 mo, hard to lose wt Past Medical History  Diagnosis Date  . Pituitary adenoma   . Obesity   . Hypothyroidism     DR Lurene Shadow  . HTN (hypertension)   . Glucose intolerance (impaired glucose tolerance)   . Allergic rhinitis   . Depression   . Hyperlipidemia   . Impaired glucose tolerance 10/04/2011  . Anxiety 02/27/2012  . Shortness of breath    Past Surgical History  Procedure Laterality Date  . Breast reduction surgery    . Tubal ligation    . Dilation and curettage of uterus  07/2010    Hysteroscopy NEG w/Dr Mezer  . Anterior cervical decomp/discectomy fusion  11/18/2012    Procedure: ANTERIOR CERVICAL DECOMPRESSION/DISCECTOMY FUSION 1 LEVEL;  Surgeon: Hewitt Shorts,  MD;  Location: [facility];  Service: Neurosurgery;  Laterality: N/A;  Cervical five-six Anterior cervical decompression/diskectomy/fusion     reports that she has never smoked. She does not have any smokeless tobacco history on file. She reports that she does not drink alcohol or use illicit drugs. family history includes Hypertension in her brother, mother, and sister; Other (age of onset: 40) in her mother. No Known Allergies     Review of Systems  Constitutional: Negative for unexpected weight change, or unusual diaphoresis  HENT: Negative for tinnitus.   Eyes: Negative for photophobia and visual disturbance.  Respiratory: Negative for choking and stridor.   Gastrointestinal: Negative for vomiting and blood in stool.  Genitourinary: Negative for hematuria and decreased urine volume.  Musculoskeletal: Negative for acute joint swelling Skin: Negative for color change and wound.  Neurological: Negative for tremors and numbness other than noted  Psychiatric/Behavioral: Negative for decreased concentration or  hyperactivity.       Objective:   Physical Exam BP 132/80  Pulse 91  Temp(Src) 97.2 F (36.2 C) (Oral)  Ht 5\' 4"  (1.626 m)  Wt 328 lb (148.78 kg)  BMI 56.27 kg/m2  SpO2 97% VS noted, morbid obese Constitutional: Pt appears well-developed and well-nourished.  HENT: Head: NCAT.  Right Ear: External ear normal.  Left Ear: External ear normal.  Eyes: Conjunctivae and EOM are normal. Pupils are equal, round, and reactive  to light.  Neck: Normal range of motion. Neck supple. thyroid mild enlarged, NT, ? nodulular Cardiovascular: Normal rate and regular rhythm.   Pulmonary/Chest: Effort normal and breath sounds normal.  Abd:  Soft, NT, non-distended, + BS Neurological: Pt is alert. Not confused , motor 5/5 Skin: Skin is warm. No erythema.  No rash Psychiatric: Pt behavior is normal. Thought content normal. 1+nervous    Assessment & Plan:

## 2013-07-24 ENCOUNTER — Ambulatory Visit: Payer: BC Managed Care – PPO | Admitting: Internal Medicine

## 2013-07-29 ENCOUNTER — Ambulatory Visit
Admission: RE | Admit: 2013-07-29 | Discharge: 2013-07-29 | Disposition: A | Discharge status: Home / Self Care | Payer: BC Managed Care – PPO | Source: Ambulatory Visit | Attending: Internal Medicine | Admitting: Internal Medicine

## 2013-07-29 DIAGNOSIS — E049 Nontoxic goiter, unspecified: Secondary | ICD-10-CM

## 2013-07-30 ENCOUNTER — Encounter: Payer: Self-pay | Admitting: Internal Medicine

## 2013-07-30 ENCOUNTER — Telehealth: Payer: Self-pay | Admitting: *Deleted

## 2013-07-30 NOTE — Telephone Encounter (Signed)
Pt called requesting [place] results.  Left message for pt to return call.

## 2013-07-30 NOTE — Telephone Encounter (Signed)
Spoke with pt advised of result note.   

## 2013-09-09 ENCOUNTER — Telehealth: Payer: Self-pay | Admitting: *Deleted

## 2013-09-09 MED ORDER — CABERGOLINE 0.5 MG PO TABS: 0.2500 mg | ORAL_TABLET | ORAL | Status: DC

## 2013-09-09 NOTE — Telephone Encounter (Signed)
Patient informed. 

## 2013-09-09 NOTE — Telephone Encounter (Signed)
Pt called states the Rx for Cabergoline dosage is wrong.  States dosage should be 0.5mg  twice a week.  Please advise

## 2013-09-09 NOTE — Telephone Encounter (Signed)
Rx resent.

## 2013-09-18 ENCOUNTER — Telehealth: Payer: Self-pay

## 2013-09-18 MED ORDER — CABERGOLINE 0.5 MG PO TABS: ORAL_TABLET | ORAL | Status: DC

## 2013-09-18 NOTE — Telephone Encounter (Signed)
rx re-done

## 2013-09-18 NOTE — Telephone Encounter (Signed)
Chelsea Thompson needs a prescription clarified.  States patient says she takes Cabergoline one tablet twice a week.  They have requested a new prescription stating correct instructions.

## 2013-10-10 ENCOUNTER — Other Ambulatory Visit: Payer: Self-pay | Admitting: Internal Medicine

## 2013-10-11 ENCOUNTER — Encounter: Payer: Self-pay | Admitting: Internal Medicine

## 2013-10-11 ENCOUNTER — Ambulatory Visit (INDEPENDENT_AMBULATORY_CARE_PROVIDER_SITE_OTHER): Payer: BC Managed Care – PPO | Admitting: Internal Medicine

## 2013-10-11 VITALS — BP 154/94 | HR 88 | Resp 14 | Ht 64.0 in

## 2013-10-11 DIAGNOSIS — J208 Acute bronchitis due to other specified organisms: Secondary | ICD-10-CM

## 2013-10-11 DIAGNOSIS — J209 Acute bronchitis, unspecified: Secondary | ICD-10-CM

## 2013-10-11 DIAGNOSIS — R059 Cough, unspecified: Secondary | ICD-10-CM

## 2013-10-11 DIAGNOSIS — R05 Cough: Secondary | ICD-10-CM

## 2013-10-11 DIAGNOSIS — A499 Bacterial infection, unspecified: Secondary | ICD-10-CM

## 2013-10-11 DIAGNOSIS — R053 Chronic cough: Secondary | ICD-10-CM

## 2013-10-11 DIAGNOSIS — B9689 Other specified bacterial agents as the cause of diseases classified elsewhere: Secondary | ICD-10-CM

## 2013-10-11 DIAGNOSIS — I10 Essential (primary) hypertension: Secondary | ICD-10-CM

## 2013-10-11 MED ORDER — AZITHROMYCIN 250 MG PO TABS: 250.0000 mg | ORAL_TABLET | ORAL | Status: DC

## 2013-10-11 MED ORDER — HYDROCODONE-HOMATROPINE 5-1.5 MG/5ML PO SYRP: ORAL_SOLUTION | ORAL | Status: DC

## 2013-10-11 MED ORDER — ALBUTEROL SULFATE HFA 108 (90 BASE) MCG/ACT IN AERS: 2.0000 | INHALATION_SPRAY | Freq: Four times a day (QID) | RESPIRATORY_TRACT | Status: DC | PRN

## 2013-10-11 NOTE — Progress Notes (Signed)
Chief Complaint  Patient presents with  . URI    fever, headache, cold, achy, sob X 1 week    HPI: Patient comes in today for [facility] Saturday clinic for  new problem evaluation. Feels like the flu but lasting  Longer onset about 10 days ago  Fever sweating col chills  Onset  Over a week ago. Some better with delsym.   Relapsing some 5 days ago.  Achy chills  Cough getting worse .  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 No hx asthma CLD  Want bp med changed  No new rash vomiting hemoptysis  otc meds   Past Medical History  Diagnosis Date  . Pituitary adenoma   . Obesity   . Hypothyroidism     DR Lurene Shadow  . HTN (hypertension)   . Glucose intolerance (impaired glucose tolerance)   . Allergic rhinitis   . Depression   . Hyperlipidemia   . Impaired glucose tolerance 10/04/2011  . Anxiety 02/27/2012  . Shortness of breath     Family History  Problem Relation Age of Onset  . Other Mother 25    MVA   . Hypertension Mother   . Hypertension Sister   . Hypertension Brother     History   Social History  . Marital Status: Married    Spouse Name: N/A    Number of Children: N/A  . Years of Education: N/A   Social History Main Topics  . Smoking status: Never Smoker   . Smokeless tobacco: None  . Alcohol Use: No  . Drug Use: No  . Sexual Activity:    Other Topics Concern  . 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 albuterol (PROAIR HFA) 108 (90 BASE) MCG/ACT inhaler Inhale 2 puffs into the lungs every 6 (six) hours as needed for wheezing.  Marland Kitchen azithromycin (ZITHROMAX Z-PAK) 250 MG tablet Take 1 tablet (250 mg total) by mouth as directed. Take 2 po first day, then 1 po qd  . cabergoline (DOSTINEX) 0.5 MG tablet 1 tab by mouth  Monday and thursday  . cyclobenzaprine (FLEXERIL) 5 MG tablet Take 1 tablet (5 mg total) by mouth 3 (three) times daily as needed for muscle spasms.  Marland Kitchen HYDROcodone-homatropine (HYCODAN) 5-1.5 MG/5ML syrup 1 tsp every 4-6 hours as needed for cough  . levothyroxine (SYNTHROID, LEVOTHROID) 25 MCG tablet TAKE 1 TABLET (25 MCG TOTAL) BY MOUTH DAILY.  . Multiple Vitamin (MULTIVITAMIN WITH MINERALS) TABS Take 1 tablet by mouth daily.  . nadolol-bendroflumethiazide (CORZIDE) 40-5 MG per tablet Take 1 tablet by mouth daily.  . sertraline (ZOLOFT) 100 MG tablet Take 150 mg by mouth daily.   . sertraline (ZOLOFT) 100 MG tablet TAKE 1 AND 1/2 TABLET BY MOUTH DAILY  . traMADol (ULTRAM) 50 MG tablet Take 1 tablet (50 mg total) by mouth every 8 (eight) hours as needed for pain.  Marland Kitchen zolpidem (AMBIEN) 10 MG tablet TAKE 1 TABLET BY MOUTH AT BEDTIME AS NEEDED FOR SLEEP  . [DISCONTINUED] naproxen (NAPROSYN) 500 MG tablet Take 1 tablet (500 mg total) by mouth 2 (two) times daily as needed.  . [DISCONTINUED] oxyCODONE-acetaminophen (PERCOCET/ROXICET) 5-325 MG per tablet Take 1-2 tablets by mouth every 4 (four) hours as  needed for pain.  . [DISCONTINUED] phentermine 37.5 MG capsule Take 1 capsule (37.5 mg total) by mouth every morning.    EXAM:  BP 154/94  Pulse 88  Resp 14  Ht 5\' 4"  (1.626 m)  SpO2 %  LMP 09/26/2013  There is no weight on file to calculate BMI. WDWN in NAD  quiet respirations; mildly congested  somewhat hoarse. Non toxic .looks mild mod ill  HEENT: Normocephalic ;atraumatic , Eyes;  PERRL, EOMs  Full, lids and conjunctiva clear,,Ears: no deformities, canals nl, TM landmarks normal, Nose: no deformity or discharge but congested;face minimally tender Mouth : OP clear without lesion or edema . Neck: Supple without adenopathy or masses or bruits Chest:  Clear to A&P without wheezes rales orrh some coarse bs  ? Tight  CV:  S1-S2 no gallops or murmurs peripheral perfusion is normal Skin :nl  perfusion and no acute rashes  Abdomen:  Sof,t normal bowel sounds without hepatosplenomegaly, no guarding rebound or masses no CVA tenderness ASSESSMENT AND PLAN:  Discussed the following assessment and plan:  Acute bacterial bronchitis - relapsing sx 10 days with poss fever poss early pna also rx with antibiotic   Cough, persistent  Unspecified essential hypertension - contact pcp about bp med changes  Empiric rx for PNA bronchitis bact    dont take cough med with Remus Loffler says not taking  -Patient advised to return or notify health care team  if symptoms worsen or persist or new concerns arise.  Patient Instructions  This still could e viral but because could be getting a bacterial bronchitis lung infection. Take antibiotic cough med is for comfort . Inhaler could hap if wheezing.  Check with your PCP about bp medication requests changes.  Expect improvement in the next 3-5 days if not contact your doctor and consider chest x ray .     Neta Mends. Reina Wilton M.D.

## 2013-10-11 NOTE — Patient Instructions (Signed)
This still could e viral but because could be getting a bacterial bronchitis lung infection. Take antibiotic cough med is for comfort . Inhaler could hap if wheezing.  Check with your PCP about bp medication requests changes.  Expect improvement in the next 3-5 days if not contact your doctor and consider chest x ray .

## 2013-11-21 ENCOUNTER — Telehealth: Payer: Self-pay

## 2013-11-21 MED ORDER — ZOLPIDEM TARTRATE 10 MG PO TABS: ORAL_TABLET | ORAL | Status: DC

## 2013-11-21 NOTE — Telephone Encounter (Signed)
Done hardcopy to robin  

## 2013-11-21 NOTE — Telephone Encounter (Signed)
Pharmacy fax states the patient would like a #90 day supply of her zolpidem 10 mg,  Advise please.  [facility] is [facility].

## 2013-11-21 NOTE — Telephone Encounter (Signed)
Faxed hardcopy to [facility] [address] [place] 

## 2013-12-05 ENCOUNTER — Other Ambulatory Visit (HOSPITAL_COMMUNITY): Payer: Self-pay | Admitting: Obstetrics and Gynecology

## 2013-12-05 DIAGNOSIS — IMO0002 Reserved for concepts with insufficient information to code with codable children: Secondary | ICD-10-CM

## 2013-12-09 ENCOUNTER — Ambulatory Visit (HOSPITAL_COMMUNITY)
Admission: RE | Admit: 2013-12-09 | Discharge: 2013-12-09 | Disposition: A | Discharge status: Home / Self Care | Payer: BC Managed Care – PPO | Source: Ambulatory Visit | Attending: Obstetrics and Gynecology | Admitting: Obstetrics and Gynecology

## 2013-12-09 DIAGNOSIS — IMO0002 Reserved for concepts with insufficient information to code with codable children: Secondary | ICD-10-CM

## 2013-12-09 DIAGNOSIS — Q51810 Arcuate uterus: Secondary | ICD-10-CM | POA: Insufficient documentation

## 2013-12-09 DIAGNOSIS — N979 Female infertility, unspecified: Secondary | ICD-10-CM | POA: Insufficient documentation

## 2013-12-09 MED ORDER — IOHEXOL 300 MG/ML  SOLN
20.0000 mL | Freq: Once | INTRAMUSCULAR | Status: AC | PRN
  Administered 2013-12-09: 20 mL

## 2014-01-16 ENCOUNTER — Ambulatory Visit: Payer: BC Managed Care – PPO | Admitting: Internal Medicine

## 2014-01-20 ENCOUNTER — Ambulatory Visit (INDEPENDENT_AMBULATORY_CARE_PROVIDER_SITE_OTHER): Payer: BC Managed Care – PPO | Admitting: Internal Medicine

## 2014-01-20 ENCOUNTER — Encounter: Payer: Self-pay | Admitting: Internal Medicine

## 2014-01-20 VITALS — BP 110/70 | HR 88 | Temp 98.1°F | Ht 64.0 in | Wt 322.0 lb

## 2014-01-20 DIAGNOSIS — F419 Anxiety disorder, unspecified: Secondary | ICD-10-CM

## 2014-01-20 DIAGNOSIS — R7309 Other abnormal glucose: Secondary | ICD-10-CM

## 2014-01-20 DIAGNOSIS — R7302 Impaired glucose tolerance (oral): Secondary | ICD-10-CM

## 2014-01-20 DIAGNOSIS — B349 Viral infection, unspecified: Secondary | ICD-10-CM | POA: Insufficient documentation

## 2014-01-20 DIAGNOSIS — I10 Essential (primary) hypertension: Secondary | ICD-10-CM

## 2014-01-20 DIAGNOSIS — F411 Generalized anxiety disorder: Secondary | ICD-10-CM

## 2014-01-20 DIAGNOSIS — E039 Hypothyroidism, unspecified: Secondary | ICD-10-CM

## 2014-01-20 MED ORDER — LABETALOL HCL 200 MG PO TABS: 200.0000 mg | ORAL_TABLET | Freq: Two times a day (BID) | ORAL | Status: DC

## 2014-01-20 MED ORDER — DIPHENOXYLATE-ATROPINE 2.5-0.025 MG PO TABS: 1.0000 | ORAL_TABLET | Freq: Four times a day (QID) | ORAL | Status: DC | PRN

## 2014-01-20 NOTE — Progress Notes (Signed)
Pre-visit discussion using our clinic review tool. No additional management support is needed unless otherwise documented below in the visit note.  

## 2014-01-20 NOTE — Progress Notes (Signed)
Subjective:    Patient ID: Chelsea Thompson, female    DOB: 07-Aug-1971, 43 y.o.   MRN: [id number]  HPI  Here to f/u; overall doing ok,  Pt denies chest pain, increased sob or doe, wheezing, orthopnea, PND, increased LE swelling, palpitations, dizziness or syncope.  Pt denies polydipsia, polyuria, or low sugar symptoms such as weakness or confusion improved with po intake.  Pt denies new neurological symptoms such as new headache, or facial or extremity weakness or numbness.   Pt states overall good compliance with meds, has been trying to follow lower cholesterol diet, with wt overall stable, hard to lose.  Plans on pregnancy soon, asks for change in BP med.  Denies hyper or hypo thyroid symptoms such as voice, skin or hair change.  Past Medical History  Diagnosis Date  . Pituitary adenoma   . Obesity   . Hypothyroidism     DR [name]  . HTN (hypertension)   . Glucose intolerance (impaired glucose tolerance)   . Allergic rhinitis   . Depression   . Hyperlipidemia   . Impaired glucose tolerance 10/04/2011  . Anxiety 02/27/2012  . Shortness of breath    Past Surgical History  Procedure Laterality Date  . Breast reduction surgery    . Tubal ligation    . Dilation and curettage of uterus  07/2010    Hysteroscopy NEG w/Dr Mezer  . Anterior cervical decomp/discectomy fusion  11/18/2012    Procedure: ANTERIOR CERVICAL DECOMPRESSION/DISCECTOMY FUSION 1 LEVEL;  Surgeon: Hosie Spangle, MD;  Location: [place] [facility];  Service: Neurosurgery;  Laterality: N/A;  Cervical five-six Anterior cervical decompression/diskectomy/fusion     reports that she has never smoked. She does not have any smokeless tobacco history on file. She reports that she does not drink alcohol or use illicit drugs. family history includes Hypertension in her brother, mother, and sister; Other (age of onset: 22) in her mother. No Known Allergies Current Outpatient Prescriptions on File Prior to Visit  Medication Sig  Dispense Refill  . cabergoline (DOSTINEX) 0.5 MG tablet 1 tab by mouth Monday and thursday  24 tablet  1  . levothyroxine (SYNTHROID, LEVOTHROID) 25 MCG tablet TAKE 1 TABLET (25 MCG TOTAL) BY MOUTH DAILY.  90 tablet  2  . Multiple Vitamin (MULTIVITAMIN WITH MINERALS) TABS Take 1 tablet by mouth daily.      . sertraline (ZOLOFT) 100 MG tablet Take 150 mg by mouth daily.       Marland Kitchen zolpidem (AMBIEN) 10 MG tablet TAKE 1 TABLET BY MOUTH AT BEDTIME AS NEEDED FOR SLEEP  90 tablet  1  . [DISCONTINUED] fexofenadine (ALLEGRA) 180 MG tablet Take 180 mg by mouth daily.         No current facility-administered medications on file prior to visit.   Review of Systems  Constitutional: Negative for unexpected weight change, or unusual diaphoresis  HENT: Negative for tinnitus.   Eyes: Negative for photophobia and visual disturbance.  Respiratory: Negative for choking and stridor.   Gastrointestinal: Negative for vomiting and blood in stool.  Genitourinary: Negative for hematuria and decreased urine volume.  Musculoskeletal: Negative for acute joint swelling Skin: Negative for color change and wound.  Neurological: Negative for tremors and numbness other than noted  Psychiatric/Behavioral: Negative for decreased concentration or  hyperactivity.       Objective:   Physical Exam BP 110/70  Pulse 88  Temp(Src) 98.1 F (36.7 C) (Oral)  Ht 5\' 4"  (1.626 m)  Wt 322 lb (146.058 kg)  BMI 55.24 kg/m2  SpO2 98% VS noted, not ill apperaing Constitutional: Pt appears well-developed and well-nourished. /morbid obese HENT: Head: NCAT.  Right Ear: External ear normal.  Left Ear: External ear normal.  Eyes: Conjunctivae and EOM are normal. Pupils are equal, round, and reactive to light.  Neck: Normal range of motion. Neck supple.  Cardiovascular: Normal rate and regular rhythm.   Pulmonary/Chest: Effort normal and breath sounds normal.  Abd:  Soft, NT, non-distended, + BS, no bruit Neurological: Pt is alert.  Not confused  Skin: Skin is warm. No erythema.  Psychiatric: Pt behavior is normal. Thought content normal.     Assessment & Plan:

## 2014-01-20 NOTE — Patient Instructions (Addendum)
OK to stop the nadolol-fluid pill medication Please take all new medication as prescribed - the labetalol at 200 mg twice per day OK to wean off the zoloft by taking 1 pill per day for One month, then Half pill per day for one month, then stop  Please call with your blood pressures in 2 wks to [place] with your trying to be pregnant  Please return in 3 months, or sooner if needed (the office should call)

## 2014-01-23 NOTE — Assessment & Plan Note (Addendum)
stable overall by history and exam, recent data reviewed with pt, and pt to continue medical treatment as before,  to f/u any worsening symptoms or concerns Lab Results  Component Value Date   WBC 10.8* 11/18/2012   HGB 12.2 11/18/2012   HCT 35.8* 11/18/2012   PLT 335 11/18/2012   GLUCOSE 109* 11/18/2012   CHOL 231* 10/02/2012   TRIG 112.0 10/02/2012   HDL 36.60* 10/02/2012   LDLDIRECT 185.8 10/02/2012   ALT 23 10/02/2012   AST 18 10/02/2012   NA 138 11/18/2012   K 2.9* 11/18/2012   CL 97 11/18/2012   CREATININE 0.61 11/18/2012   BUN 14 11/18/2012   CO2 31 11/18/2012   TSH 2.268 07/02/2012   HGBA1C 5.7 10/02/2012   To start wean off zoloft prior to pregnancy

## 2014-01-23 NOTE — Assessment & Plan Note (Signed)
stable overall by history and exam, recent data reviewed with pt, and pt to continue medical treatment as before,  to f/u any worsening symptoms or concerns Lab Results  Component Value Date   HGBA1C 5.7 10/02/2012

## 2014-01-23 NOTE — Assessment & Plan Note (Signed)
Controlled, but for pregnancy soon, ok to change to labetolol asd

## 2014-01-23 NOTE — Assessment & Plan Note (Signed)
stable overall by history and exam, recent data reviewed with pt, and pt to continue medical treatment as before,  to f/u any worsening symptoms or concerns Lab Results  Component Value Date   TSH 2.268 07/02/2012

## 2014-02-13 ENCOUNTER — Encounter: Payer: Self-pay | Admitting: Internal Medicine

## 2014-02-13 ENCOUNTER — Ambulatory Visit: Payer: BC Managed Care – PPO | Admitting: Internal Medicine

## 2014-02-13 ENCOUNTER — Ambulatory Visit (INDEPENDENT_AMBULATORY_CARE_PROVIDER_SITE_OTHER): Payer: BC Managed Care – PPO | Admitting: Internal Medicine

## 2014-02-13 VITALS — BP 114/68 | HR 97 | Temp 98.7°F | Ht 64.0 in | Wt 323.8 lb

## 2014-02-13 DIAGNOSIS — I10 Essential (primary) hypertension: Secondary | ICD-10-CM

## 2014-02-13 DIAGNOSIS — R7302 Impaired glucose tolerance (oral): Secondary | ICD-10-CM

## 2014-02-13 DIAGNOSIS — J209 Acute bronchitis, unspecified: Secondary | ICD-10-CM

## 2014-02-13 DIAGNOSIS — R7309 Other abnormal glucose: Secondary | ICD-10-CM

## 2014-02-13 MED ORDER — AZITHROMYCIN 250 MG PO TABS: ORAL_TABLET | ORAL | Status: DC

## 2014-02-13 MED ORDER — HYDROCODONE-HOMATROPINE 5-1.5 MG/5ML PO SYRP: 5.0000 mL | ORAL_SOLUTION | Freq: Four times a day (QID) | ORAL | Status: DC | PRN

## 2014-02-13 MED ORDER — LABETALOL HCL 300 MG PO TABS: 300.0000 mg | ORAL_TABLET | Freq: Two times a day (BID) | ORAL | Status: DC

## 2014-02-13 NOTE — Progress Notes (Signed)
Pre visit review using our clinic review tool, if applicable. No additional management support is needed unless otherwise documented below in the visit note. 

## 2014-02-13 NOTE — Assessment & Plan Note (Signed)
Uncontrolled, to incr the labetolo to 300 bid, cont to monitor BP at home and next visit

## 2014-02-13 NOTE — Patient Instructions (Signed)
Please take all new medication as prescribed - the antibiotic, cough medicine Please increase the labetolol to 300 mg twice per day  (ok to stop the labetolol 200 mg)  Please continue all other medications as before, and refills have been done if requested. Please have the pharmacy call with any other refills you may need.  Please remember to sign up for MyChart if you have not done so, as this will be important to you in the future with finding out test results, communicating by private email, and scheduling acute appointments online when needed.  Please return in 3 months, or sooner if needed

## 2014-02-13 NOTE — Assessment & Plan Note (Signed)
stable overall by history and exam, recent data reviewed with pt, and pt to continue medical treatment as before,  to f/u any worsening symptoms or concerns Lab Results  Component Value Date   HGBA1C 5.7 10/02/2012    

## 2014-02-13 NOTE — Assessment & Plan Note (Signed)
Mild to mod, for antibx course,  to f/u any worsening symptoms or concerns 

## 2014-02-13 NOTE — Progress Notes (Signed)
Subjective:    Patient ID: Chelsea Thompson, female    DOB: Nov 27, 1971, 43 y.o.   MRN: [id number]  HPI  Here with acute onset mild to mod 2-3 days ST, HA, general weakness and malaise, with prod cough greenish sputum, but Pt denies chest pain, increased sob or doe, wheezing, orthopnea, PND, increased LE swelling, palpitations, dizziness or syncope.   Pt denies polydipsia, polyuria, Pt denies new neurological symptoms such as new headache, or facial or extremity weakness or numbness  BP at home often 159/96 on [address]. No other new complaints Past Medical History  Diagnosis Date  . Pituitary adenoma   . Obesity   . Hypothyroidism     DR [name]  . HTN (hypertension)   . Glucose intolerance (impaired glucose tolerance)   . Allergic rhinitis   . Depression   . Hyperlipidemia   . Impaired glucose tolerance 10/04/2011  . Anxiety 02/27/2012  . Shortness of breath    Past Surgical History  Procedure Laterality Date  . Breast reduction surgery    . Tubal ligation    . Dilation and curettage of uterus  07/2010    Hysteroscopy NEG w/Dr Mezer  . Anterior cervical decomp/discectomy fusion  11/18/2012    Procedure: ANTERIOR CERVICAL DECOMPRESSION/DISCECTOMY FUSION 1 LEVEL;  Surgeon: Hosie Spangle, MD;  Location: [place] [facility];  Service: Neurosurgery;  Laterality: N/A;  Cervical five-six Anterior cervical decompression/diskectomy/fusion     reports that she has never smoked. She does not have any smokeless tobacco history on file. She reports that she does not drink alcohol or use illicit drugs. family history includes Hypertension in her brother, mother, and sister; Other (age of onset: 36) in her mother. No Known Allergies Current Outpatient Prescriptions on File Prior to Visit  Medication Sig Dispense Refill  . cabergoline (DOSTINEX) 0.5 MG tablet 1 tab by mouth Monday and thursday  24 tablet  1  . diphenoxylate-atropine (LOMOTIL) 2.5-0.025 MG per tablet Take 1 tablet by mouth 4 (four) times  daily as needed for diarrhea or loose stools.  40 tablet  0  . levothyroxine (SYNTHROID, LEVOTHROID) 25 MCG tablet TAKE 1 TABLET (25 MCG TOTAL) BY MOUTH DAILY.  90 tablet  2  . Multiple Vitamin (MULTIVITAMIN WITH MINERALS) TABS Take 1 tablet by mouth daily.      . sertraline (ZOLOFT) 100 MG tablet Take 150 mg by mouth daily.       Marland Kitchen zolpidem (AMBIEN) 10 MG tablet TAKE 1 TABLET BY MOUTH AT BEDTIME AS NEEDED FOR SLEEP  90 tablet  1  . [DISCONTINUED] fexofenadine (ALLEGRA) 180 MG tablet Take 180 mg by mouth daily.         No current facility-administered medications on file prior to visit.   Review of Systems  Constitutional: Negative for unexpected weight change, or unusual diaphoresis  HENT: Negative for tinnitus.   Eyes: Negative for photophobia and visual disturbance.  Respiratory: Negative for choking and stridor.   Gastrointestinal: Negative for vomiting and blood in stool.  Genitourinary: Negative for hematuria and decreased urine volume.  Musculoskeletal: Negative for acute joint swelling Skin: Negative for color change and wound.  Neurological: Negative for tremors and numbness other than noted  Psychiatric/Behavioral: Negative for decreased concentration or  hyperactivity.       Objective:   Physical Exam BP 114/68  Pulse 97  Temp(Src) 98.7 F (37.1 C) (Oral)  Ht 5\' 4"  (1.626 m)  Wt 323 lb 12 oz (146.852 kg)  BMI 55.54 kg/m2  SpO2  96% VS noted, mild ill Constitutional: Pt appears well-developed and well-nourished.  HENT: Head: NCAT.  Right Ear: External ear normal.  Left Ear: External ear normal.  Bilat tm's with mild erythema.  Max sinus areas mild tender.  Pharynx with non erythema, no exudate Eyes: Conjunctivae and EOM are normal. Pupils are equal, round, and reactive to light.  Neck: Normal range of motion. Neck supple.  Cardiovascular: Normal rate and regular rhythm.   Pulmonary/Chest: Effort normal and breath sounds normal.  Neurological: Pt is alert. Not  confused  Skin: Skin is warm. No erythema.  Psychiatric: Pt behavior is normal. Thought content normal.     Assessment & Plan:

## 2014-02-16 ENCOUNTER — Other Ambulatory Visit: Payer: Self-pay | Admitting: Internal Medicine

## 2014-02-19 ENCOUNTER — Encounter: Payer: BC Managed Care – PPO | Admitting: Internal Medicine

## 2014-03-04 ENCOUNTER — Encounter: Payer: Self-pay | Admitting: Internal Medicine

## 2014-03-04 LAB — HM MAMMOGRAPHY

## 2014-03-11 ENCOUNTER — Telehealth: Payer: Self-pay | Admitting: Internal Medicine

## 2014-03-11 ENCOUNTER — Ambulatory Visit (INDEPENDENT_AMBULATORY_CARE_PROVIDER_SITE_OTHER): Payer: BC Managed Care – PPO | Admitting: Internal Medicine

## 2014-03-11 ENCOUNTER — Encounter: Payer: Self-pay | Admitting: Internal Medicine

## 2014-03-11 VITALS — BP 152/88 | HR 98 | Temp 97.4°F | Ht 64.0 in | Wt 336.2 lb

## 2014-03-11 DIAGNOSIS — F329 Major depressive disorder, single episode, unspecified: Secondary | ICD-10-CM

## 2014-03-11 DIAGNOSIS — W503XXA Accidental bite by another person, initial encounter: Secondary | ICD-10-CM | POA: Insufficient documentation

## 2014-03-11 DIAGNOSIS — D352 Benign neoplasm of pituitary gland: Secondary | ICD-10-CM

## 2014-03-11 DIAGNOSIS — E039 Hypothyroidism, unspecified: Secondary | ICD-10-CM

## 2014-03-11 DIAGNOSIS — I10 Essential (primary) hypertension: Secondary | ICD-10-CM

## 2014-03-11 DIAGNOSIS — D353 Benign neoplasm of craniopharyngeal duct: Secondary | ICD-10-CM

## 2014-03-11 DIAGNOSIS — F3289 Other specified depressive episodes: Secondary | ICD-10-CM

## 2014-03-11 DIAGNOSIS — T148XXA Other injury of unspecified body region, initial encounter: Secondary | ICD-10-CM

## 2014-03-11 MED ORDER — AMOXICILLIN-POT CLAVULANATE 875-125 MG PO TABS: 1.0000 | ORAL_TABLET | Freq: Two times a day (BID) | ORAL | Status: DC

## 2014-03-11 MED ORDER — HYDROCHLOROTHIAZIDE 25 MG PO TABS: 25.0000 mg | ORAL_TABLET | Freq: Every day | ORAL | Status: DC

## 2014-03-11 MED ORDER — ESCITALOPRAM OXALATE 10 MG PO TABS: 10.0000 mg | ORAL_TABLET | Freq: Every day | ORAL | Status: DC
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
## 2014-03-11 NOTE — Telephone Encounter (Signed)
Relevant patient education assigned to patient using Emmi. ° °

## 2014-03-11 NOTE — Patient Instructions (Addendum)
Please take all new medication as prescribed - the antibiotic for the arm, the lexapro for low mood, and the HCTZ fluid pill to help Blood Pressure and swelling  Please continue all other medications as before, and refills have been done if requested. Please have the pharmacy call with any other refills you may need.  You are given the work note for today, as well as the note for 2 wks leave of abscence  You will be contacted regarding the referral for: counseling, and Dr Chalmers Cater  Please return in 3 months, or sooner if needed

## 2014-03-11 NOTE — Progress Notes (Signed)
Pre visit review using our clinic review tool, if applicable. No additional management support is needed unless otherwise documented below in the visit note. 

## 2014-03-14 NOTE — Assessment & Plan Note (Signed)
With possible cellultis, for augmentin bid,  to f/u any worsening symptoms or concerns

## 2014-03-14 NOTE — Assessment & Plan Note (Signed)
For endo f/u per pt request,  to f/u any worsening symptoms or concerns,  Lab Results  Component Value Date   TSH 2.268 07/02/2012

## 2014-03-14 NOTE — Assessment & Plan Note (Signed)
Uncontrolled, to add hct 25 qd, cont all other meds, f/u next visit and at home BP Readings from Last 3 Encounters:  03/11/14 152/88  02/13/14 114/68  01/20/14 110/70

## 2014-03-14 NOTE — Assessment & Plan Note (Signed)
Requests referral to endo/dr balan for f/u as has been > 1 yr, required by insurance

## 2014-03-14 NOTE — Assessment & Plan Note (Signed)
For re-start zoloft which helped prior, for LOA x 2 wks from work, refer counseling, work note given today

## 2014-04-17 ENCOUNTER — Other Ambulatory Visit: Payer: Self-pay | Admitting: Internal Medicine

## 2014-04-20 NOTE — Telephone Encounter (Signed)
Done erx 

## 2014-05-01 ENCOUNTER — Ambulatory Visit: Payer: BC Managed Care – PPO | Admitting: Psychology

## 2014-05-08 ENCOUNTER — Other Ambulatory Visit: Payer: Self-pay

## 2014-05-08 MED ORDER — LABETALOL HCL 300 MG PO TABS: 300.0000 mg | ORAL_TABLET | Freq: Two times a day (BID) | ORAL | Status: DC

## 2014-06-26 ENCOUNTER — Other Ambulatory Visit: Payer: Self-pay | Admitting: Endocrinology

## 2014-06-26 DIAGNOSIS — E049 Nontoxic goiter, unspecified: Secondary | ICD-10-CM

## 2014-07-07 ENCOUNTER — Other Ambulatory Visit: Payer: Self-pay | Admitting: Neurological Surgery

## 2014-07-15 ENCOUNTER — Ambulatory Visit
Admission: RE | Admit: 2014-07-15 | Discharge: 2014-07-15 | Disposition: A | Discharge status: Home / Self Care | Payer: BC Managed Care – PPO | Source: Ambulatory Visit | Attending: Endocrinology | Admitting: Endocrinology

## 2014-07-15 DIAGNOSIS — E049 Nontoxic goiter, unspecified: Secondary | ICD-10-CM

## 2014-08-16 ENCOUNTER — Other Ambulatory Visit: Payer: Self-pay | Admitting: Internal Medicine

## 2014-08-16 ENCOUNTER — Encounter (HOSPITAL_COMMUNITY): Payer: Self-pay | Admitting: Emergency Medicine

## 2014-08-16 ENCOUNTER — Emergency Department (HOSPITAL_COMMUNITY)
Admission: EM | Admit: 2014-08-16 | Discharge: 2014-08-16 | Disposition: A | Discharge status: Home / Self Care | Payer: BC Managed Care – PPO | Attending: Emergency Medicine | Admitting: Emergency Medicine

## 2014-08-16 DIAGNOSIS — L299 Pruritus, unspecified: Secondary | ICD-10-CM | POA: Insufficient documentation

## 2014-08-16 DIAGNOSIS — Z8709 Personal history of other diseases of the respiratory system: Secondary | ICD-10-CM | POA: Diagnosis not present

## 2014-08-16 DIAGNOSIS — I10 Essential (primary) hypertension: Secondary | ICD-10-CM | POA: Diagnosis not present

## 2014-08-16 DIAGNOSIS — Z79899 Other long term (current) drug therapy: Secondary | ICD-10-CM | POA: Insufficient documentation

## 2014-08-16 DIAGNOSIS — Y9389 Activity, other specified: Secondary | ICD-10-CM | POA: Diagnosis not present

## 2014-08-16 DIAGNOSIS — E039 Hypothyroidism, unspecified: Secondary | ICD-10-CM | POA: Insufficient documentation

## 2014-08-16 DIAGNOSIS — E669 Obesity, unspecified: Secondary | ICD-10-CM | POA: Insufficient documentation

## 2014-08-16 DIAGNOSIS — Y9289 Other specified places as the place of occurrence of the external cause: Secondary | ICD-10-CM | POA: Diagnosis not present

## 2014-08-16 DIAGNOSIS — T628X1A Toxic effect of other specified noxious substances eaten as food, accidental (unintentional), initial encounter: Secondary | ICD-10-CM | POA: Diagnosis not present

## 2014-08-16 DIAGNOSIS — Z8659 Personal history of other mental and behavioral disorders: Secondary | ICD-10-CM | POA: Diagnosis not present

## 2014-08-16 DIAGNOSIS — T7840XA Allergy, unspecified, initial encounter: Secondary | ICD-10-CM

## 2014-08-16 MED ORDER — HYDROXYZINE HCL 25 MG PO TABS: 25.0000 mg | ORAL_TABLET | Freq: Four times a day (QID) | ORAL | Status: DC

## 2014-08-16 MED ORDER — HYDROXYZINE HCL 25 MG PO TABS
50.0000 mg | ORAL_TABLET | Freq: Once | ORAL | Status: AC
  Administered 2014-08-16: 50 mg via ORAL
  Filled 2014-08-16: qty 2

## 2014-08-16 MED ORDER — PREDNISONE 20 MG PO TABS: ORAL_TABLET | ORAL | Status: DC

## 2014-08-16 NOTE — ED Provider Notes (Signed)
CSN: [id number]     Arrival date & time 08/16/14  1638 History   None    Chief Complaint  Patient presents with  . Allergic Reaction     (Consider location/radiation/quality/duration/timing/severity/associated sxs/prior Treatment) HPI Chelsea Thompson is a 43 y.o. female with a history of anxiety and obesity who comes in for evaluation of itching. She states Friday evening about 4:66 PM she ate a slice of pizza and [employer], she says as soon as she bit into it she can feel her lip swelling and her whole body was tingling. She says she ate it anyway because she was hungry. She reports taking Benadryl after the event which helped the itching. Says she woke up and at 3:00 in the morning and ate some peanuts and began itching again. She reports feeding take yesterday afternoon and it also had nuts in it and the itching started again.  She says the Benadryl is no longer working. She is not aware of any allergies. She reports mild difficulty catching her breath. No fevers, chills, chest pain, abdominal pain, numbness or weakness.  Past Medical History  Diagnosis Date  . Pituitary adenoma   . Obesity   . Hypothyroidism     DR [name]  . HTN (hypertension)   . Glucose intolerance (impaired glucose tolerance)   . Allergic rhinitis   . Depression   . Hyperlipidemia   . Impaired glucose tolerance 10/04/2011  . Anxiety 02/27/2012  . Shortness of breath    Past Surgical History  Procedure Laterality Date  . Breast reduction surgery    . Dilation and curettage of uterus  07/2010    Hysteroscopy NEG w/Dr Mezer  . Anterior cervical decomp/discectomy fusion  11/18/2012    Procedure: ANTERIOR CERVICAL DECOMPRESSION/DISCECTOMY FUSION 1 LEVEL;  Surgeon: Hosie Spangle, MD;  Location: [facility];  Service: Neurosurgery;  Laterality: N/A;  Cervical five-six Anterior cervical decompression/diskectomy/fusion    Family History  Problem Relation Age of Onset  . Other Mother 62    MVA   . Hypertension  Mother   . Hypertension Sister   . Hypertension Brother    History  Substance Use Topics  . Smoking status: Never Smoker   . Smokeless tobacco: Not on file  . Alcohol Use: No   OB History   Grav Para Term Preterm Abortions TAB SAB Ect Mult Living                 Review of Systems  Constitutional: Negative for fever.  Respiratory: Negative for shortness of breath.   Cardiovascular: Negative for chest pain.  Skin: Negative for rash.       Itching      Allergies  Review of patient's allergies indicates no known allergies.  Home Medications   Prior to Admission medications   Medication Sig Start Date End Date Taking? Authorizing Provider  cabergoline (DOSTINEX) 0.5 MG tablet TAKE 1 TABLET BY MOUTH EVERY MONDAY AND THURSDAY   Yes Biagio Borg, MD  cholecalciferol (VITAMIN D) 1000 UNITS tablet Take 2,000 Units by mouth daily.   Yes Historical Provider, MD  hydrochlorothiazide (HYDRODIURIL) 25 MG tablet Take 1 tablet (25 mg total) by mouth daily. 03/11/14  Yes Biagio Borg, MD  labetalol (NORMODYNE) 300 MG tablet Take 1 tablet (300 mg total) by mouth 2 (two) times daily. 05/08/14  Yes Biagio Borg, MD  levothyroxine (SYNTHROID, LEVOTHROID) 25 MCG tablet TAKE 1 TABLET (25 MCG TOTAL) BY MOUTH DAILY. 10/10/13  Yes Hunt Oris  John, MD  Multiple Vitamin (MULTIVITAMIN WITH MINERALS) TABS Take 1 tablet by mouth daily.   Yes Historical Provider, MD  predniSONE (DELTASONE) 20 MG tablet 3 tabs po day one, then 2 tabs daily x 4 days 08/16/14   Viona Gilmore Pollie Poma, PA-C   BP 160/82  Pulse 87  Temp(Src) 97.7 F (36.5 C) (Oral)  Resp 16  SpO2 100%  LMP 07/19/2014 Physical Exam  Nursing note and vitals reviewed. Constitutional:  Awake, alert, nontoxic appearance.  HENT:  Head: Atraumatic.  Mouth/Throat: Oropharynx is clear and moist. No oropharyngeal exudate.  Clear and moist with no erythema. No unilateral tonsillar swelling. No exudate appreciated. Patent airway. No periorbital, glossal, or  uvular swelling  Eyes: Right eye exhibits no discharge. Left eye exhibits no discharge.  Neck: Neck supple.  Pulmonary/Chest: Effort normal and breath sounds normal. No stridor. No respiratory distress. She has no wheezes. She has no rales. She exhibits no tenderness.  Abdominal: Soft. There is no tenderness. There is no rebound.  Musculoskeletal: She exhibits no tenderness.  Baseline ROM, no obvious new focal weakness.  Neurological:  Mental status and motor strength appears baseline for patient and situation.  Skin: No rash noted.  Minor marks from excoriation, no hives or other rash appreciated.  Psychiatric: She has a normal mood and affect.    ED Course  Procedures (including critical care time) Labs Review Labs Reviewed - No data to display  Imaging Review No results found.   EKG Interpretation None     Meds given in ED:  Medications  hydrOXYzine (ATARAX/VISTARIL) tablet 50 mg (50 mg Oral Given 08/16/14 0615)    New Prescriptions   PREDNISONE (DELTASONE) 20 MG TABLET    3 tabs po day one, then 2 tabs daily x 4 days    Filed Vitals:   08/16/14 0552 08/16/14 0638 08/16/14 0750  BP: 218/119 167/96 160/82  Pulse: 107 93 87  Temp: 97.4 F (36.3 C) 97.4 F (36.3 C) 97.7 F (36.5 C)  TempSrc: Oral Oral Oral  Resp: 20 18 16   SpO2: 99% 94% 100%    MDM  Vitals stable - WNL -afebrile Pt resting comfortably in ED. Says Vistaril worked well. Itching greatly decreased, still able to breathe without difficulty and no periorbital swelling PE showed no sign of anaphylaxis or other emergent allergic reaction Will DC with prednisone taper and vistaril for itching Discussed f/u with PCP and return precautions, pt very amenable to plan.   Final diagnoses:  Allergic reaction, initial encounter  Prior to patient discharge, I discussed and reviewed this case with Dr.Delo         Verl Dicker, PA-C 08/16/14 [place], PA-C 08/16/14 [phone]

## 2014-08-16 NOTE — ED Notes (Signed)
Pt states last night she started having shortness of breath and itching around 8pm after eating a piece of pizza at [facility]

## 2014-08-16 NOTE — Discharge Instructions (Signed)

## 2014-08-17 NOTE — ED Provider Notes (Signed)
Medical screening examination/treatment/procedure(s) were performed by non-physician practitioner and as supervising physician I was immediately available for consultation/collaboration.     Veryl Speak, MD 08/17/14 [phone]

## 2014-08-18 NOTE — Telephone Encounter (Signed)
Pt already on labetolol on med list  Robin to contact pt - does she take both labetolol and corzide - b/c they are similar medications and not usually taken together

## 2014-08-19 NOTE — Telephone Encounter (Signed)
[place] for corzide- done erx

## 2014-08-19 NOTE — Telephone Encounter (Signed)
Patient called back in regards to this.  She does not want to be on labetolol anymore b/c she is not trying to get pregnant anymore.  Patient can be reached at [phone].

## 2014-08-20 NOTE — Telephone Encounter (Signed)
Patient informed of MD instructions.  Left Detailed message.

## 2014-08-24 ENCOUNTER — Other Ambulatory Visit: Payer: Self-pay | Admitting: Internal Medicine

## 2014-08-25 NOTE — Telephone Encounter (Signed)
Done hardcopy to robin  - the [employer], other 2 done erx

## 2014-08-26 NOTE — Telephone Encounter (Signed)
Hardcopy has been faxed

## 2014-08-27 ENCOUNTER — Ambulatory Visit (INDEPENDENT_AMBULATORY_CARE_PROVIDER_SITE_OTHER): Payer: BC Managed Care – PPO | Admitting: Internal Medicine

## 2014-08-27 ENCOUNTER — Ambulatory Visit: Payer: BC Managed Care – PPO

## 2014-08-27 ENCOUNTER — Encounter: Payer: Self-pay | Admitting: Internal Medicine

## 2014-08-27 VITALS — BP 132/84 | HR 103 | Temp 98.6°F | Wt 314.2 lb

## 2014-08-27 DIAGNOSIS — Z0000 Encounter for general adult medical examination without abnormal findings: Secondary | ICD-10-CM

## 2014-08-27 DIAGNOSIS — T783XXA Angioneurotic edema, initial encounter: Secondary | ICD-10-CM

## 2014-08-27 DIAGNOSIS — Z23 Encounter for immunization: Secondary | ICD-10-CM

## 2014-08-27 DIAGNOSIS — G478 Other sleep disorders: Secondary | ICD-10-CM

## 2014-08-27 DIAGNOSIS — R7309 Other abnormal glucose: Secondary | ICD-10-CM

## 2014-08-27 DIAGNOSIS — R7302 Impaired glucose tolerance (oral): Secondary | ICD-10-CM

## 2014-08-27 DIAGNOSIS — J309 Allergic rhinitis, unspecified: Secondary | ICD-10-CM

## 2014-08-27 DIAGNOSIS — G4730 Sleep apnea, unspecified: Secondary | ICD-10-CM

## 2014-08-27 LAB — URINALYSIS, ROUTINE W REFLEX MICROSCOPIC
Bilirubin Urine: NEGATIVE
Ketones, ur: NEGATIVE
Nitrite: NEGATIVE
Specific Gravity, Urine: 1.025 (ref 1.000–1.030)
Total Protein, Urine: NEGATIVE
Urine Glucose: NEGATIVE
Urobilinogen, UA: 0.2 (ref 0.0–1.0)
pH: 6 (ref 5.0–8.0)

## 2014-08-27 LAB — CBC WITH DIFFERENTIAL/PLATELET
Basophils Absolute: 0 10*3/uL (ref 0.0–0.1)
Basophils Relative: 0.4 % (ref 0.0–3.0)
Eosinophils Absolute: 0.3 10*3/uL (ref 0.0–0.7)
Eosinophils Relative: 3.5 % (ref 0.0–5.0)
HCT: 37.4 % (ref 36.0–46.0)
Hemoglobin: 12.2 g/dL (ref 12.0–15.0)
Lymphocytes Relative: 26.5 % (ref 12.0–46.0)
Lymphs Abs: 2.2 10*3/uL (ref 0.7–4.0)
MCHC: 32.5 g/dL (ref 30.0–36.0)
MCV: 73.5 fl — ABNORMAL LOW (ref 78.0–100.0)
Monocytes Absolute: 0.7 10*3/uL (ref 0.1–1.0)
Monocytes Relative: 8.2 % (ref 3.0–12.0)
Neutro Abs: 5.1 10*3/uL (ref 1.4–7.7)
Neutrophils Relative %: 61.4 % (ref 43.0–77.0)
Platelets: 446 10*3/uL — ABNORMAL HIGH (ref 150.0–400.0)
RBC: 5.09 Mil/uL (ref 3.87–5.11)
RDW: 14.6 % (ref 11.5–15.5)
WBC: 8.3 10*3/uL (ref 4.0–10.5)

## 2014-08-27 MED ORDER — EPINEPHRINE 0.3 MG/0.3ML IJ SOAJ: 0.3000 mg | Freq: Once | INTRAMUSCULAR | Status: DC

## 2014-08-27 MED ORDER — METHYLPREDNISOLONE ACETATE 80 MG/ML IJ SUSP
80.0000 mg | Freq: Once | INTRAMUSCULAR | Status: AC
  Administered 2014-08-27: 80 mg via INTRAMUSCULAR

## 2014-08-27 MED ORDER — SERTRALINE HCL 100 MG PO TABS: ORAL_TABLET | ORAL | Status: DC

## 2014-08-27 NOTE — Patient Instructions (Addendum)
Please take all new medication as prescribed - the epipen if needed  You had the flu shot today  Please continue all other medications as before, and refills have been done if requested.  Please have the pharmacy call with any other refills you may need.  You will be contacted regarding the referral for: [facility], and Allergist  Please continue your efforts at being more active, low cholesterol diet, and weight control.  You are otherwise up to date with prevention measures today.  Please keep your appointments with your specialists as you may have planned  Please go to the LAB in the Basement (turn left off the elevator) for the tests to be done today  You will be contacted by phone if any changes need to be made immediately.  Otherwise, you will receive a letter about your results with an explanation, but please check with MyChart first.  Please remember to sign up for MyChart if you have not done so, as this will be important to you in the future with finding out test results, communicating by private email, and scheduling acute appointments online when needed.  Please return in 1 year for your yearly visit, or sooner if needed, with Lab testing done 3-5 days before

## 2014-08-27 NOTE — Progress Notes (Signed)
Subjective:    Patient ID: Chelsea Thompson, female    DOB: 1970-12-30, 43 y.o.   MRN: [id number]  HPI  Here for wellness and f/u;  Overall doing ok;  Pt denies CP, worsening SOB, DOE, wheezing, orthopnea, PND, worsening LE edema, palpitations, dizziness or syncope.  Pt denies neurological change such as new headache, facial or extremity weakness.  Pt denies polydipsia, polyuria, or low sugar symptoms. Pt states overall good compliance with treatment and medications, good tolerability, and has been trying to follow lower cholesterol diet.  . No fever, night sweats, wt loss, loss of appetite, or other constitutional symptoms.  Pt states good ability with ADL's, has low fall risk, home safety reviewed and adequate, no other significant changes in hearing or vision, and only occasionally active with exercise.  Has had mild worsening depressive symptoms, no suicidal ideation, or panic; has ongoing anxiety, not increased recently. Seemed to have dry mouth with atarax, so stopped. Just re-started zoloft, needs refill.  Had an angioedema rxn with lip swelling and tingling with blisters on lip, spots on face, itching all over, espec feet after pizza and peanuts and shellfish, seen in ER , tx with prednison and vistaril but did not take prednisone, got better lip swelling , but still itching and feels poorly. Cant state a reason for not taking the prednisone,except the provider to her did not seem to care, no testing done. Has actually been eating peanuts almost daily for yrs. Does have several wks ongoing nasal allergy symptoms with clearish congestion, itch and sneezing, without fever, pain, ST.  Has ongoing difficulty breathing and snoring at night as well. Past Medical History  Diagnosis Date  . Pituitary adenoma   . Obesity   . Hypothyroidism     DR [name]  . HTN (hypertension)   . Glucose intolerance (impaired glucose tolerance)   . Allergic rhinitis   . Depression   . Hyperlipidemia   . Impaired  glucose tolerance 10/04/2011  . Anxiety 02/27/2012  . Shortness of breath    Past Surgical History  Procedure Laterality Date  . Breast reduction surgery    . Dilation and curettage of uterus  07/2010    Hysteroscopy NEG w/Dr Mezer  . Anterior cervical decomp/discectomy fusion  11/18/2012    Procedure: ANTERIOR CERVICAL DECOMPRESSION/DISCECTOMY FUSION 1 LEVEL;  Surgeon: Hosie Spangle, MD;  Location: [facility] [facility];  Service: Neurosurgery;  Laterality: N/A;  Cervical five-six Anterior cervical decompression/diskectomy/fusion     reports that she has never smoked. She does not have any smokeless tobacco history on file. She reports that she does not drink alcohol or use illicit drugs. family history includes Hypertension in her brother, mother, and sister; Other (age of onset: 64) in her mother. No Known Allergies Current Outpatient Prescriptions on File Prior to Visit  Medication Sig Dispense Refill  . cabergoline (DOSTINEX) 0.5 MG tablet TAKE 1 TABLET BY MOUTH EVERY MONDAY AND THURSDAY  24 tablet  1  . cabergoline (DOSTINEX) 0.5 MG tablet TAKE 1 TABLET BY MOUTH EVERY MONDAY AND THURSDAY  24 tablet  0  . cholecalciferol (VITAMIN D) 1000 UNITS tablet Take 2,000 Units by mouth daily.      . hydrochlorothiazide (HYDRODIURIL) 25 MG tablet Take 1 tablet (25 mg total) by mouth daily.  90 tablet  3  . hydrOXYzine (ATARAX/VISTARIL) 25 MG tablet Take 1 tablet (25 mg total) by mouth every 6 (six) hours.  12 tablet  0  . levothyroxine (SYNTHROID, LEVOTHROID) 25 MCG tablet  TAKE 1 TABLET (25 MCG TOTAL) BY MOUTH DAILY.  90 tablet  2  . Multiple Vitamin (MULTIVITAMIN WITH MINERALS) TABS Take 1 tablet by mouth daily.      . nadolol-bendroflumethiazide (CORZIDE) 40-5 MG per tablet TAKE 1 TABLET BY MOUTH DAILY.  90 tablet  3  . predniSONE (DELTASONE) 20 MG tablet 3 tabs po day one, then 2 tabs daily x 4 days  11 tablet  0  . zolpidem (AMBIEN) 10 MG tablet TAKE 1 TABLET BY MOUTH AT BEDTIME AS NEEDED FOR SLEEP   90 tablet  1  . [DISCONTINUED] fexofenadine (ALLEGRA) 180 MG tablet Take 180 mg by mouth daily.         No current facility-administered medications on file prior to visit.   Review of Systems Constitutional: Negative for increased diaphoresis, other activity, appetite or other siginficant weight change  HENT: Negative for worsening hearing loss, ear pain, facial swelling, mouth sores and neck stiffness.   Eyes: Negative for other worsening pain, redness or visual disturbance.  Respiratory: Negative for shortness of breath and wheezing.   Cardiovascular: Negative for chest pain and palpitations.  Gastrointestinal: Negative for diarrhea, blood in stool, abdominal distention or other pain Genitourinary: Negative for hematuria, flank pain or change in urine volume.  Musculoskeletal: Negative for myalgias or other joint complaints.  Skin: Negative for color change and wound.  Neurological: Negative for syncope and numbness. other than noted Hematological: Negative for adenopathy. or other swelling Psychiatric/Behavioral: Negative for hallucinations, self-injury, decreased concentration or other worsening agitation.      Objective:   Physical Exam BP 132/84  Pulse 103  Temp(Src) 98.6 F (37 C) (Oral)  Wt 314 lb 4 oz (142.543 kg)  SpO2 94%  LMP 07/19/2014 VS noted,  Constitutional: Pt is oriented to person, place, and time. Appears well-developed and well-nourished.  Head: Normocephalic and atraumatic.  Right Ear: External ear normal.  Left Ear: External ear normal.  Nose: Nose normal.  Mouth/Throat: Oropharynx is clear and moist.  Eyes: Conjunctivae and EOM are normal. Pupils are equal, round, and reactive to light.  Neck: Normal range of motion. Neck supple. No JVD present. No tracheal deviation present.  Cardiovascular: Normal rate, regular rhythm, normal heart sounds and intact distal pulses.   Pulmonary/Chest: Effort normal and breath sounds without rales or wheezing    Abdominal: Soft. Bowel sounds are normal. NT. No HSM  Musculoskeletal: Normal range of motion. Exhibits no edema.  Lymphadenopathy:  Has no cervical adenopathy.  Neurological: Pt is alert and oriented to person, place, and time. Pt has normal reflexes. No cranial nerve deficit. Motor grossly intact Skin: Skin is warm and dry. Faint rash noted to extremities, no lip or tongue swelling, no wheezing but does have nontender swelling mild to right upper medial arm Psychiatric:  Has normal mood and affect. Behavior is normal.     Assessment & Plan:

## 2014-08-27 NOTE — Progress Notes (Signed)
Pre visit review using our clinic review tool, if applicable. No additional management support is needed unless otherwise documented below in the visit note. 

## 2014-08-28 ENCOUNTER — Other Ambulatory Visit: Payer: Self-pay | Admitting: Internal Medicine

## 2014-08-28 ENCOUNTER — Encounter: Payer: Self-pay | Admitting: Internal Medicine

## 2014-08-28 LAB — ~~LOC~~ ALLERGY PANEL
Allergen, Cedar tree, t12: <0.1 kU/L
Allergen, Comm Silver Birch, t9: 0.3 kU/L — ABNORMAL HIGH
Allergen, D pternoyssinus,d7: <0.1 kU/L
Allergen, Mulberry, t76: <0.1 kU/L
Alternaria Alternata: 0.42 kU/L — ABNORMAL HIGH
Aspergillus fumigatus, m3: <0.1 kU/L
Bahia Grass: <0.1 kU/L
Bermuda Grass: <0.1 kU/L
Box Elder IgE: <0.1 kU/L
Cat Dander: <0.1 kU/L
Cladosporium Herbarum: <0.1 kU/L
Cockroach: 0.16 kU/L — ABNORMAL HIGH
Common Ragweed: 0.56 kU/L — ABNORMAL HIGH
D. farinae: <0.1 kU/L
Dog Dander: <0.1 kU/L
Elm IgE: <0.1 kU/L
Johnson Grass: <0.1 kU/L
Mucor Racemosus: <0.1 kU/L
Mugwort: 6.23 kU/L — ABNORMAL HIGH
Nettle: <0.1 kU/L
Oak: 0.11 kU/L — ABNORMAL HIGH
Pecan/Hickory Tree IgE: 0.55 kU/L — ABNORMAL HIGH
Penicillium Notatum: <0.1 kU/L
Plantain: <0.1 kU/L
Rough Pigweed  IgE: <0.1 kU/L
Sheep Sorrel IgE: <0.1 kU/L
Stemphylium Botryosum: 0.15 kU/L — ABNORMAL HIGH
Sweet Gum: <0.1 kU/L
Timothy Grass: <0.1 kU/L

## 2014-08-28 LAB — LIPID PANEL
Cholesterol: 235 mg/dL — ABNORMAL HIGH (ref 0–200)
HDL: 43.1 mg/dL (ref >39.00)
NonHDL: 191.9
Total CHOL/HDL Ratio: 5
Triglycerides: 224 mg/dL — ABNORMAL HIGH (ref 0.0–149.0)
VLDL: 44.8 mg/dL — ABNORMAL HIGH (ref 0.0–40.0)

## 2014-08-28 LAB — HEPATIC FUNCTION PANEL
ALT: 23 U/L (ref 0–35)
AST: 25 U/L (ref 0–37)
Albumin: 3.9 g/dL (ref 3.5–5.2)
Alkaline Phosphatase: 151 U/L — ABNORMAL HIGH (ref 39–117)
Bilirubin, Direct: 0.1 mg/dL (ref 0.0–0.3)
Total Bilirubin: 0.3 mg/dL (ref 0.2–1.2)
Total Protein: 8.6 g/dL — ABNORMAL HIGH (ref 6.0–8.3)

## 2014-08-28 LAB — BASIC METABOLIC PANEL
BUN: 13 mg/dL (ref 6–23)
CO2: 26 mEq/L (ref 19–32)
Calcium: 9.4 mg/dL (ref 8.4–10.5)
Chloride: 97 mEq/L (ref 96–112)
Creatinine, Ser: 0.8 mg/dL (ref 0.4–1.2)
GFR: 99.77 mL/min (ref >60.00)
Glucose, Bld: 116 mg/dL — ABNORMAL HIGH (ref 70–99)
Potassium: 3.2 mEq/L — ABNORMAL LOW (ref 3.5–5.1)
Sodium: 136 mEq/L (ref 135–145)

## 2014-08-28 LAB — LDL CHOLESTEROL, DIRECT: Direct LDL: 181.7 mg/dL

## 2014-08-28 LAB — TSH: TSH: 0.59 u[IU]/mL (ref 0.35–4.50)

## 2014-08-28 MED ORDER — POTASSIUM CHLORIDE ER 10 MEQ PO TBCR
10.0000 meq | EXTENDED_RELEASE_TABLET | Freq: Every day | ORAL | Status: DC
Start: 2014-08-28 — End: 2014-09-02

## 2014-08-29 DIAGNOSIS — G4730 Sleep apnea, unspecified: Secondary | ICD-10-CM | POA: Insufficient documentation

## 2014-08-29 DIAGNOSIS — T783XXA Angioneurotic edema, initial encounter: Secondary | ICD-10-CM | POA: Insufficient documentation

## 2014-08-29 NOTE — Assessment & Plan Note (Signed)

## 2014-08-29 NOTE — Assessment & Plan Note (Signed)
Asympt, pt to call for onset polys with steroid tx

## 2014-08-29 NOTE — Assessment & Plan Note (Addendum)
Mild to mod, for depomedrol IM,  to f/u any worsening symptoms or concerns for allergy panels, epipen prn, likely new peaniut allergy

## 2014-08-29 NOTE — Assessment & Plan Note (Signed)
Mild to mod, for depomedrol IM,  to f/u any worsening symptoms or concerns 

## 2014-08-29 NOTE — Assessment & Plan Note (Addendum)
[place] for neurology referral,  to f/u any worsening symptoms or concerns, r/o osa

## 2014-08-31 LAB — FOOD ALLERGY PROFILE
Allergen Corn, IgE: <0.1 kU/L
Clam IgE: <0.1 kU/L
Codfish IgE: <0.1 kU/L
Egg White IgE: <0.1 kU/L
Milk IgE: 0.19 kU/L — AB
Peanut IgE: <0.1 kU/L
Scallop IgE: <0.1 kU/L
Sesame Seed IgE: <0.1 kU/L
Shrimp IgE: <0.1 kU/L
Soybean IgE: <0.1 kU/L
Walnut IgE: <0.1 kU/L
Wheat IgE: <0.1 kU/L

## 2014-09-01 ENCOUNTER — Encounter: Payer: Self-pay | Admitting: Internal Medicine

## 2014-09-02 ENCOUNTER — Encounter: Payer: Self-pay | Admitting: Neurology

## 2014-09-02 ENCOUNTER — Ambulatory Visit (INDEPENDENT_AMBULATORY_CARE_PROVIDER_SITE_OTHER): Payer: BC Managed Care – PPO | Admitting: Neurology

## 2014-09-02 VITALS — BP 135/83 | HR 67 | Resp 16 | Ht 65.0 in | Wt 315.0 lb

## 2014-09-02 DIAGNOSIS — R5381 Other malaise: Secondary | ICD-10-CM

## 2014-09-02 DIAGNOSIS — R0609 Other forms of dyspnea: Secondary | ICD-10-CM

## 2014-09-02 DIAGNOSIS — Z72820 Sleep deprivation: Secondary | ICD-10-CM

## 2014-09-02 DIAGNOSIS — R5383 Other fatigue: Secondary | ICD-10-CM

## 2014-09-02 DIAGNOSIS — R0989 Other specified symptoms and signs involving the circulatory and respiratory systems: Secondary | ICD-10-CM

## 2014-09-02 DIAGNOSIS — E662 Morbid (severe) obesity with alveolar hypoventilation: Secondary | ICD-10-CM

## 2014-09-02 DIAGNOSIS — R0683 Snoring: Secondary | ICD-10-CM

## 2014-09-02 DIAGNOSIS — R351 Nocturia: Secondary | ICD-10-CM

## 2014-09-02 NOTE — Progress Notes (Signed)
SLEEP MEDICINE CLINIC   Provider:  Larey Seat, M D  Referring Provider: Biagio Borg, MD Primary Care Physician:  Cathlean Cower, MD  Chief Complaint  Patient presents with  . New Evaluation    Room  . Sleep consult    HPI:  Graciemae Delisle is a 43 y.o.  [place], married, right handed female , who is seen here as a referral from Dr. Jenny Reichmann for a sleep evaluation.  I would like to thank Dr. Jenny Reichmann for this referral and for the extensive notes that he provided for me. He said that the patient had been hospitalized recently after an allergic reaction. A great surprise she lives she is allergic to peanuts but she is concerned all her adult life and childhood. She was not placed on an EpiPen just in case. She had and you in the mouth with lip swelling tingling blisters on the lip spots on her face itching all over, especially in the feet - this happened after eating pizza, shellfish and peanuts.  She was seen in the emergency room and treated with prednisone and Vistaril.  Ongoing difficulties breathing and snoring at night. Upon further questioning her husband , he reported that she snores independent of allergy or not and so but he has also witnessed some apneas.  The patient has a history of impaired glucose tolerance, high cholesterol levels, allergic rhinitis, hypertension, hypothyroidism, and a pituitary adenoma.  She is by medical standarts is morbidly obese.   The patient usually goes to bed between 9.30 Pm and watches TV on a timer.  Watches her Mother , who in turn doesn't sleep until 3 AM.  She a has 2 nocturias in 4 hours . She wakes with headaches and with a dry mouth.   This care taking has exhausted her, interferes with her ability to concentrate to sleep and to relax. She is childless and thus became the caretaker of choice. Her siblings have children.  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  She rises at  7.30 AM and has no caffeine, drinks only water, drives to work less than 10 minutes.  She doesn't  take naps.   Review of Systems: Out of a complete 14 system review, the patient complains of only the following symptoms, and all other reviewed systems are negative. Snoring, fatigue, sleep deprivation,  Insomnia.   Epworth score 4, Fatigue severity score 33 , depression score 3 points.    History   Social History  . Marital Status: Married    Spouse Name: Torry    Number of Children: 0  . Years of Education: Masters0   Occupational History  .  [place] History Main Topics  . Smoking status: Never Smoker   . Smokeless tobacco: Never Used  . Alcohol Use: No  . Drug Use: No  . Sexual Activity: Not on file   Other Topics Concern  . 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   Patient has a Scientist, water quality.   Patient is right-handed.   Patient drinks one cup of soda daily.   Works as [employer]             Family History  Problem Relation Age of Onset  . Other Mother 98    MVA   .  Hypertension Mother   . Hypertension Sister   . Hypertension Brother     Past Medical History  Diagnosis Date  . Pituitary adenoma   . Obesity   . Hypothyroidism     DR [name]  . HTN (hypertension)   . Glucose intolerance (impaired glucose tolerance)   . Allergic rhinitis   . Depression   . Hyperlipidemia   . Impaired glucose tolerance 10/04/2011  . Anxiety 02/27/2012  . Shortness of breath     Past Surgical History  Procedure Laterality Date  . Breast reduction surgery    . Dilation and curettage of uterus  07/2010    Hysteroscopy NEG w/Dr Mezer  . Anterior cervical decomp/discectomy fusion  11/18/2012    Procedure: ANTERIOR CERVICAL DECOMPRESSION/DISCECTOMY FUSION 1 LEVEL;  Surgeon: Hosie Spangle, MD;  Location: [place];  Service:  Neurosurgery;  Laterality: N/A;  Cervical five-six Anterior cervical decompression/diskectomy/fusion     Current Outpatient Prescriptions  Medication Sig Dispense Refill  . cabergoline (DOSTINEX) 0.5 MG tablet TAKE 1 TABLET BY MOUTH EVERY MONDAY AND THURSDAY  24 tablet  0  . cholecalciferol (VITAMIN D) 1000 UNITS tablet Take 2,000 Units by mouth daily.      Marland Kitchen EPINEPHrine (EPIPEN 2-PAK) 0.3 mg/0.3 mL IJ SOAJ injection Inject 0.3 mLs (0.3 mg total) into the muscle once.  2 Device  1  . levothyroxine (SYNTHROID, LEVOTHROID) 25 MCG tablet TAKE 1 TABLET (25 MCG TOTAL) BY MOUTH DAILY.  90 tablet  2  . Multiple Vitamin (MULTIVITAMIN WITH MINERALS) TABS Take 1 tablet by mouth daily.      . nadolol-bendroflumethiazide (CORZIDE) 40-5 MG per tablet TAKE 1 TABLET BY MOUTH DAILY.  90 tablet  3  . sertraline (ZOLOFT) 100 MG tablet TAKE 2 tabs by mouth per day  180 tablet  1  . zolpidem (AMBIEN) 10 MG tablet TAKE 1 TABLET BY MOUTH AT BEDTIME AS NEEDED FOR SLEEP  90 tablet  1  . [DISCONTINUED] fexofenadine (ALLEGRA) 180 MG tablet Take 180 mg by mouth daily.         No current facility-administered medications for this visit.    Allergies as of 09/02/2014 - Review Complete 09/02/2014  Allergen Reaction Noted  . Peanut-containing drug products  08/29/2014    Vitals: BP 135/83  Pulse 67  Resp 16  Ht 5\' 5"  (1.651 m)  Wt 315 lb (142.883 kg)  BMI 52.42 kg/m2  LMP 08/29/2014 Last Weight:  Wt Readings from Last 1 Encounters:  09/02/14 315 lb (142.883 kg)       Last Height:   Ht Readings from Last 1 Encounters:  09/02/14 5\' 5"  (1.651 m)    Physical exam:  General: The patient is awake, alert and appears not in acute distress. The patient is well groomed. Head: Normocephalic, atraumatic. Neck is supple. Mallampati 3 ,  neck circumference: 16 inches . Nasal airflow  unrestricted , TMJ is not  evident . Retrognathia is not seen.  Cardiovascular:  Regular rate and rhythm , without  murmurs or carotid  bruit, and without distended neck veins. Respiratory: Lungs are clear to auscultation. Skin:  Without evidence of edema, or rash Trunk: BMI is  elevated and patient  has normal posture.  Neurologic exam : The patient is awake and alert, oriented to place and time.   Memory subjective  described as intact. There is a fatigue impaired attention span & concentration ability.  Speech is fluent without  dysarthria, dysphonia or aphasia. Mood and affect are appropriate.  Cranial nerves: Pupils are equal and briskly reactive to light. Funduscopic exam without evidence of pallor or edema.  Extraocular movements  in vertical and horizontal planes intact and without nystagmus. Visual fields by finger perimetry are intact. Hearing to finger rub intact.  Facial sensation intact to fine touch. Facial motor strength is symmetric and tongue and uvula move midline.  Motor exam: Normal tone, muscle bulk and symmetric, strength in all extremities.  Sensory:  Fine touch, pinprick and vibration were tested in all extremities. Proprioception is normal.  Coordination: Rapid alternating movements in the fingers/hands is normal.  Finger-to-nose maneuver normal without evidence of ataxia, dysmetria or tremor.  Gait and station: Patient walks without assistive device and is able unassisted to climb up to the exam table. Strength within normal limits. Stance is stable and normal.  Deep tendon reflexes: in the  upper and lower extremities are symmetric and intact. Babinski maneuver response is  downgoing.   Assessment:  After physical and neurologic examination, review of laboratory studies, imaging, neurophysiology testing and pre-existing records, assessment is   1) extremely fatigued patient with sleep deprivation as main cause. Caregiver burden, reduced sleep time . In addition, BMI  snoring and nocturia, morning headaches, all OSA risk factors. Weight loss.    The patient was advised of the nature of the  diagnosed sleep disorder , the treatment options and risks for general a health and wellness arising from not treating the  OSA condition. Visit duration was 45  minutes.   Plan:  Treatment plan and additional workup :  SPLIT study with CO2 .  AHI split at 15 and scoe at 3% , get some supine sleep before 1.30 AM. Appointment parallel to her mothers sleep study appointment .   Asencion Partridge Manuel Lawhead MD  09/02/2014

## 2014-09-26 ENCOUNTER — Encounter: Payer: Self-pay | Admitting: Internal Medicine

## 2014-09-26 ENCOUNTER — Ambulatory Visit (INDEPENDENT_AMBULATORY_CARE_PROVIDER_SITE_OTHER): Payer: BC Managed Care – PPO | Admitting: Internal Medicine

## 2014-09-26 VITALS — BP 132/84 | Temp 98.1°F | Wt 315.0 lb

## 2014-09-26 DIAGNOSIS — J069 Acute upper respiratory infection, unspecified: Secondary | ICD-10-CM

## 2014-09-26 DIAGNOSIS — B9789 Other viral agents as the cause of diseases classified elsewhere: Principal | ICD-10-CM

## 2014-09-26 MED ORDER — HYDROCODONE-HOMATROPINE 5-1.5 MG/5ML PO SYRP: 5.0000 mL | ORAL_SOLUTION | ORAL | Status: DC | PRN

## 2014-09-26 NOTE — Patient Instructions (Addendum)
I  think this is a viral respiratory infection. And will run its course .   You can try decongestant in daytime if getting face stuffinesss ( to avoid effecting sleep) Cough med for comfort  At night .  INSTRUCTIONS FOR UPPER RESPIRATORY INFECTION:  -plenty of rest and fluids  -nasal saline wash 2-3 times daily (use prepackaged nasal saline or bottled/distilled water if making your own)  -clean nose with nasal saline before using  nasal steroid or sinex  -can use sinex nasal spray for drainage and nasal congestion - but do NOT use longer then 3-4 days overuses can cause rebound problems . Can alternate nostrils. -can use tylenol or ibuprofen as directed for aches and sorethroat  -in the winter time, using a humidifier at night is helpful (please follow cleaning instructions)  -if you are taking a cough medication - use only as directed, may also try a teaspoon of honey to coat the throat and throat lozenges ONLY  for short term use. -for sore throat, salt water gargles can help  -follow up if you have fevers, facial pain, tooth pain, difficulty breathing or are worsening or not getting better in another week    Refill of ambien should come from your PCP

## 2014-09-26 NOTE — Progress Notes (Signed)
Chief Complaint  Patient presents with  . Sore Throat    cough, right ear pain, nasal congestion, eyes hurt x 3 days     HPI: Patient comes in today for [facility] Saturday clinic for  new problem evaluation. pcp Dr Jenny Reichmann last seen 9 15 for PV . 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 Has sore throat  lef tmore than right  Congested and in throat  And no fever .  Robitussin. DM  Worse at night .  ROS: See pertinent positives and negatives per HPI.no cp sob fever chills   Past Medical History  Diagnosis Date  . Pituitary adenoma   . Obesity   . Hypothyroidism     DR [name]  . HTN (hypertension)   . Glucose intolerance (impaired glucose tolerance)   . Allergic rhinitis   . Depression   . Hyperlipidemia   . Impaired glucose tolerance 10/04/2011  . Anxiety 02/27/2012  . Shortness of breath     Family History  Problem Relation Age of Onset  . Other Mother 79    MVA   . Hypertension Mother   . Hypertension Sister   . Hypertension Brother     History   Social History  . Marital Status: Married    Spouse Name: Torry    Number of Children: 0  . Years of Education: Masters0   Occupational History  .  [employer] History Main Topics  . Smoking status: Never Smoker   . Smokeless tobacco: Never Used  . Alcohol Use: No  . Drug Use: No  . Sexual Activity: None   Other Topics Concern  . None   Social History Narrative   Patient is married Animator) and lives at home with her husband and her mother.   Patient has a Scientist, water quality.   Patient is right-handed.   Patient drinks one cup of soda daily.   Works as [employer]             Outpatient Encounter Prescriptions as of 09/26/2014  Medication Sig  . cabergoline (DOSTINEX) 0.5 MG tablet TAKE 1 TABLET BY MOUTH EVERY MONDAY AND THURSDAY  . cholecalciferol (VITAMIN D) 1000 UNITS tablet Take 2,000 Units by mouth daily.  Marland Kitchen EPINEPHrine (EPIPEN 2-PAK) 0.3 mg/0.3 mL IJ SOAJ injection  Inject 0.3 mLs (0.3 mg total) into the muscle once.  . hydrOXYzine (ATARAX/VISTARIL) 25 MG tablet   . levothyroxine (SYNTHROID, LEVOTHROID) 25 MCG tablet TAKE 1 TABLET (25 MCG TOTAL) BY MOUTH DAILY.  . Multiple Vitamin (MULTIVITAMIN WITH MINERALS) TABS Take 1 tablet by mouth daily.  . nadolol-bendroflumethiazide (CORZIDE) 40-5 MG per tablet TAKE 1 TABLET BY MOUTH DAILY.  Marland Kitchen sertraline (ZOLOFT) 100 MG tablet TAKE 2 tabs by mouth per day  . zolpidem (AMBIEN) 10 MG tablet TAKE 1 TABLET BY MOUTH AT BEDTIME AS NEEDED FOR SLEEP  . HYDROcodone-homatropine (HYCODAN) 5-1.5 MG/5ML syrup Take 5 mLs by mouth every 4 (four) hours as needed for cough. 1-2 tsp    EXAM:  BP 132/84  Temp(Src) 98.1 F (36.7 C) (Oral)  Wt 315 lb (142.883 kg)  LMP 08/29/2014  Body mass index is 52.42 kg/(m^2). WDWN in NAD  quiet respirations; mildly congested  somewhat hoarse. Non toxic . HEENT: Normocephalic ;atraumatic , Eyes;  PERRL, EOMs  Full, lids and conjunctiva clear,,Ears: no deformities, canals nl, TM landmarks normal, 1+ wax right ear  Nose: no deformity or discharge but congested;face non tender Mouth : OP  clear without lesion or edema . Neck: Supple tender small left ac no no pc nodes  or masses or bruits Chest:  Clear to A&P without wheezes rales or rhonchi CV:  S1-S2 no gallops or murmurs peripheral perfusion is normal Skin :nl perfusion and no acute rashes  PSYCH: pleasant and cooperative, no obvious depression or anxiety  ASSESSMENT AND PLAN:  Discussed the following assessment and plan:  Viral upper respiratory tract infection with cough - expectant management .  Referred ear pain from tender ln no evidence of strep . Bacterial exposures .  -Patient advised to return or notify health care team  if symptoms worsen ,persist or new concerns arise.  Patient Instructions  I  think this is a viral respiratory infection. And will run its course .   You can try decongestant in daytime if getting face  stuffinesss ( to avoid effecting sleep) Cough med for comfort  At night .  INSTRUCTIONS FOR UPPER RESPIRATORY INFECTION:  -plenty of rest and fluids  -nasal saline wash 2-3 times daily (use prepackaged nasal saline or bottled/distilled water if making your own)  -clean nose with nasal saline before using  nasal steroid or sinex  -can use sinex nasal spray for drainage and nasal congestion - but do NOT use longer then 3-4 days overuses can cause rebound problems . Can alternate nostrils. -can use tylenol or ibuprofen as directed for aches and sorethroat  -in the winter time, using a humidifier at night is helpful (please follow cleaning instructions)  -if you are taking a cough medication - use only as directed, may also try a teaspoon of honey to coat the throat and throat lozenges ONLY  for short term use. -for sore throat, salt water gargles can help  -follow up if you have fevers, facial pain, tooth pain, difficulty breathing or are worsening or not getting better in another week    Refill of ambien should come from your PCP      Mariann Laster K. Jaclyne Haverstick M.D.  Pre visit review using our clinic review tool, if applicable. No additional management support is needed unless otherwise documented below in the visit note.

## 2014-10-05 ENCOUNTER — Ambulatory Visit (INDEPENDENT_AMBULATORY_CARE_PROVIDER_SITE_OTHER): Payer: BC Managed Care – PPO | Admitting: Neurology

## 2014-10-05 DIAGNOSIS — R5381 Other malaise: Secondary | ICD-10-CM

## 2014-10-05 DIAGNOSIS — R0683 Snoring: Secondary | ICD-10-CM

## 2014-10-05 DIAGNOSIS — R351 Nocturia: Secondary | ICD-10-CM

## 2014-10-05 DIAGNOSIS — G4733 Obstructive sleep apnea (adult) (pediatric): Secondary | ICD-10-CM

## 2014-10-05 DIAGNOSIS — Z72820 Sleep deprivation: Secondary | ICD-10-CM

## 2014-10-05 DIAGNOSIS — R5383 Other fatigue: Secondary | ICD-10-CM

## 2014-10-05 DIAGNOSIS — E662 Morbid (severe) obesity with alveolar hypoventilation: Secondary | ICD-10-CM

## 2014-10-06 NOTE — Sleep Study (Signed)
Please see the scanned sleep study interpretation located in the Procedure tab within the Chart Review section. 

## 2014-10-08 ENCOUNTER — Encounter: Payer: Self-pay | Admitting: *Deleted

## 2014-10-08 ENCOUNTER — Telehealth: Payer: Self-pay | Admitting: *Deleted

## 2014-10-08 ENCOUNTER — Other Ambulatory Visit: Payer: Self-pay | Admitting: Neurology

## 2014-10-08 ENCOUNTER — Encounter: Payer: Self-pay | Admitting: Neurology

## 2014-10-08 DIAGNOSIS — R351 Nocturia: Secondary | ICD-10-CM | POA: Insufficient documentation

## 2014-10-08 DIAGNOSIS — G4733 Obstructive sleep apnea (adult) (pediatric): Secondary | ICD-10-CM

## 2014-10-08 DIAGNOSIS — E662 Morbid (severe) obesity with alveolar hypoventilation: Secondary | ICD-10-CM | POA: Insufficient documentation

## 2014-10-08 DIAGNOSIS — Z9989 Dependence on other enabling machines and devices: Secondary | ICD-10-CM

## 2014-10-08 HISTORY — DX: Obstructive sleep apnea (adult) (pediatric): Z99.89

## 2014-10-08 HISTORY — DX: Obstructive sleep apnea (adult) (pediatric): G47.33

## 2014-10-08 NOTE — Telephone Encounter (Signed)
Patient was called and provided her sleep study results that revealed sleep apnea.  Patient was referred to [place] for CPAP set up.  Patient was in agreement with this plan.  Patient was mailed a copy of the results and a copy was routed to Dr. Cathlean Cower.

## 2014-10-13 ENCOUNTER — Ambulatory Visit (INDEPENDENT_AMBULATORY_CARE_PROVIDER_SITE_OTHER): Payer: BC Managed Care – PPO | Admitting: Internal Medicine

## 2014-10-13 ENCOUNTER — Encounter: Payer: Self-pay | Admitting: Internal Medicine

## 2014-10-13 VITALS — BP 152/80 | HR 87 | Temp 98.3°F | Ht 64.0 in | Wt 315.0 lb

## 2014-10-13 DIAGNOSIS — I10 Essential (primary) hypertension: Secondary | ICD-10-CM

## 2014-10-13 DIAGNOSIS — R7302 Impaired glucose tolerance (oral): Secondary | ICD-10-CM

## 2014-10-13 DIAGNOSIS — J029 Acute pharyngitis, unspecified: Secondary | ICD-10-CM | POA: Insufficient documentation

## 2014-10-13 DIAGNOSIS — J028 Acute pharyngitis due to other specified organisms: Secondary | ICD-10-CM

## 2014-10-13 MED ORDER — HYDROCODONE-HOMATROPINE 5-1.5 MG/5ML PO SYRP: 5.0000 mL | ORAL_SOLUTION | Freq: Four times a day (QID) | ORAL | Status: DC | PRN

## 2014-10-13 MED ORDER — AZITHROMYCIN 250 MG PO TABS
ORAL_TABLET | ORAL | Status: DC
Start: 2014-10-13 — End: 2014-10-28

## 2014-10-13 NOTE — Assessment & Plan Note (Signed)
Asympt, stable overall by history and exam, recent data reviewed with pt, and pt to continue medical treatment as before,  to f/u any worsening symptoms or concerns Lab Results  Component Value Date   HGBA1C 5.7 10/02/2012

## 2014-10-13 NOTE — Assessment & Plan Note (Signed)
Severe with exudate - for zpack x 1, cough med prn,  to f/u any worsening symptoms or concerns

## 2014-10-13 NOTE — Assessment & Plan Note (Signed)
Mild elev today likely situational, o/w stable overall by history and exam, recent data reviewed with pt, and pt to continue medical treatment as before,  to f/u any worsening symptoms or concerns BP Readings from Last 3 Encounters:  10/13/14 152/80  09/26/14 132/84  09/02/14 135/83

## 2014-10-13 NOTE — Progress Notes (Signed)
Pre visit review using our clinic review tool, if applicable. No additional management support is needed unless otherwise documented below in the visit note. 

## 2014-10-13 NOTE — Progress Notes (Signed)
Subjective:    Patient ID: Chelsea Thompson, female    DOB: 1971-03-29, 43 y.o.   MRN: [id number]   HPI  Here with acute onset severe ST x 3 days, with acute onset fever, facial pain, pressure, headache, general weakness and malaise, and non prod cough, but pt denies chest pain, wheezing, increased sob or doe, orthopnea, PND, increased LE swelling, palpitations, dizziness or syncope.  Pt denies polydipsia, polyuria, o.  Pt states overall good compliance with meds, trying to follow lower cholesterol diet. Past Medical History  Diagnosis Date  . Pituitary adenoma   . Obesity   . Hypothyroidism     DR [name]  . HTN (hypertension)   . Glucose intolerance (impaired glucose tolerance)   . Allergic rhinitis   . Depression   . Hyperlipidemia   . Impaired glucose tolerance 10/04/2011  . Anxiety 02/27/2012  . Shortness of breath   . OSA (obstructive sleep apnea) 10/08/2014   Past Surgical History  Procedure Laterality Date  . Breast reduction surgery    . Dilation and curettage of uterus  07/2010    Hysteroscopy NEG w/Dr Mezer  . Anterior cervical decomp/discectomy fusion  11/18/2012    Procedure: ANTERIOR CERVICAL DECOMPRESSION/DISCECTOMY FUSION 1 LEVEL;  Surgeon: Hosie Spangle, MD;  Location: [facility];  Service: Neurosurgery;  Laterality: N/A;  Cervical five-six Anterior cervical decompression/diskectomy/fusion     reports that she has never smoked. She has never used smokeless tobacco. She reports that she does not drink alcohol or use illicit drugs. family history includes Hypertension in her brother, mother, and sister; Other (age of onset: 73) in her mother. Allergies  Allergen Reactions  . Peanut-Containing Drug Products    Current Outpatient Prescriptions on File Prior to Visit  Medication Sig Dispense Refill  . cabergoline (DOSTINEX) 0.5 MG tablet TAKE 1 TABLET BY MOUTH EVERY MONDAY AND THURSDAY 24 tablet 0  . cholecalciferol (VITAMIN D) 1000 UNITS tablet Take 2,000  Units by mouth daily.    Marland Kitchen EPINEPHrine (EPIPEN 2-PAK) 0.3 mg/0.3 mL IJ SOAJ injection Inject 0.3 mLs (0.3 mg total) into the muscle once. 2 Device 1  . hydrOXYzine (ATARAX/VISTARIL) 25 MG tablet     . levothyroxine (SYNTHROID, LEVOTHROID) 25 MCG tablet TAKE 1 TABLET (25 MCG TOTAL) BY MOUTH DAILY. 90 tablet 2  . Multiple Vitamin (MULTIVITAMIN WITH MINERALS) TABS Take 1 tablet by mouth daily.    . nadolol-bendroflumethiazide (CORZIDE) 40-5 MG per tablet TAKE 1 TABLET BY MOUTH DAILY. 90 tablet 3  . sertraline (ZOLOFT) 100 MG tablet TAKE 2 tabs by mouth per day 180 tablet 1  . zolpidem (AMBIEN) 10 MG tablet TAKE 1 TABLET BY MOUTH AT BEDTIME AS NEEDED FOR SLEEP 90 tablet 1  . [DISCONTINUED] fexofenadine (ALLEGRA) 180 MG tablet Take 180 mg by mouth daily.       No current facility-administered medications on file prior to visit.   Review of Systems  Constitutional: Negative for unusual diaphoresis or other sweats  HENT: Negative for ringing in ear Eyes: Negative for double vision or worsening visual disturbance.  Respiratory: Negative for choking and stridor.   Gastrointestinal: Negative for vomiting or other signifcant bowel change Genitourinary: Negative for hematuria or decreased urine volume.  Musculoskeletal: Negative for other MSK pain or swelling Skin: Negative for color change and worsening wound.  Neurological: Negative for tremors and numbness other than noted  Psychiatric/Behavioral: Negative for decreased concentration or agitation other than above       Objective:   Physical  Exam BP 152/80 mmHg  Pulse 87  Temp(Src) 98.3 F (36.8 C) (Oral)  Ht 5\' 4"  (1.626 m)  Wt 315 lb (142.883 kg)  BMI 54.04 kg/m2  SpO2 95% VS noted, mild ill Constitutional: Pt appears well-developed, well-nourished.  HENT: Head: NCAT.  Right Ear: External ear normal.  Left Ear: External ear normal.  Eyes: . Pupils are equal, round, and reactive to light. Conjunctivae and EOM are normal Bilat tm's  with mild erythema.  Max sinus areas mild tender.  Pharynx with mild erythema, +exudate Neck: Normal range of motion. Neck supple. with bilat tender submandibular LA Cardiovascular: Normal rate and regular rhythm.   Pulmonary/Chest: Effort normal and breath sounds normal.  Neurological: Pt is alert. Not confused , motor grossly intact Skin: Skin is warm. No rash Psychiatric: Pt behavior is normal. No agitation.     Assessment & Plan:

## 2014-10-13 NOTE — Patient Instructions (Signed)
Please take all new medication as prescribed - the antibiotic, and the cough medicine  Please continue all other medications as before, and refills have been done if requested.  Please have the pharmacy call with any other refills you may need.  Please continue your efforts at being more active, low cholesterol diet, and weight control..  Please keep your appointments with your specialists as you may have planned    

## 2014-10-27 ENCOUNTER — Encounter: Payer: Self-pay | Admitting: *Deleted

## 2014-10-28 ENCOUNTER — Ambulatory Visit (INDEPENDENT_AMBULATORY_CARE_PROVIDER_SITE_OTHER): Payer: BC Managed Care – PPO | Admitting: Neurology

## 2014-10-28 ENCOUNTER — Encounter: Payer: Self-pay | Admitting: *Deleted

## 2014-10-28 ENCOUNTER — Encounter: Payer: Self-pay | Admitting: Neurology

## 2014-10-28 VITALS — BP 151/85 | HR 77 | Ht 65.0 in | Wt 311.2 lb

## 2014-10-28 DIAGNOSIS — R0683 Snoring: Secondary | ICD-10-CM

## 2014-10-28 DIAGNOSIS — R4189 Other symptoms and signs involving cognitive functions and awareness: Secondary | ICD-10-CM

## 2014-10-28 DIAGNOSIS — G4733 Obstructive sleep apnea (adult) (pediatric): Secondary | ICD-10-CM

## 2014-10-28 DIAGNOSIS — G4719 Other hypersomnia: Secondary | ICD-10-CM

## 2014-10-28 DIAGNOSIS — R5382 Chronic fatigue, unspecified: Secondary | ICD-10-CM

## 2014-10-28 NOTE — Patient Instructions (Signed)
Overall you are doing fairly well but I do want to suggest a few things today:   Remember to drink plenty of fluid, eat healthy meals and do not skip any meals. Try to eat protein with a every meal and eat a healthy snack such as fruit or nuts in between meals. Try to keep a regular sleep-wake schedule and try to exercise daily, particularly in the form of walking, 20-30 minutes a day, if you can.   As far as your medications are concerned, I would like to suggest; Can try Suvorexant with cpap if you find it is difficult to sleep. Take one before bedtime, about an hour  I would like to see you back in 6 - 12 weeks with Dr. Brett Fairy, sooner if we need to. Please call us with any interim questions, concerns, problems, updates or refill requests.   Please also call us for any test results so we can go over those with you on the phone.  My clinical assistant and will answer any of your questions and relay your messages to me and also relay most of my messages to you.   Our phone number is [phone]. We also have an after hours call service for urgent matters and there is a physician on-call for urgent questions. For any emergencies you know to call 911 or go to the nearest emergency room

## 2014-10-28 NOTE — Progress Notes (Addendum)
[facility]    Provider:  Dr Jaynee Eagles Referring Provider: Biagio Borg, MD Primary Care Physician:  Cathlean Cower, MD  CC:  Memory loss, frequent awakening  HPI:  Chelsea Thompson is a 43 y.o. female here as a referral from Dr. Jenny Reichmann for frequent awakenings, memory loss, sleeping difficulty, Recently diagnosed with sleep apnea, starting cpap this week. She goes to sleep between 11 an 12. Takes hours to get to sleep. Then wakes up frequently. She sleeps with a neck brace and has a lot of discomfort.  Worries about her mom and checks up on mother at night. No focal neurologic symptoms. Symptoms have been going on for years. She can't concentrate at work. She is teary in the office. She is excessively tired during the day. She is morbidly obese. Symptoms are continuous and she told her husband she "may not be here much longer" because she is worried something is seriously wrong with her to have such symptoms and fatigue. Affecting her work and her life.   Reviewed notes, labs and imaging from outside physicians, which showed AHI 40.3, RDI 40.3. Good response to CPAP. Discussed study with Dr. Brett Fairy. TSh WNL  Review of Systems: Patient complains of symptoms per HPI as well as the following symptoms: insomnia, choking, trouble swallowing, snoring, sleep talking, back pain, neck pain, neck stiffness, memory loss, headache, depression . Pertinent negatives per HPI. All others negative.   History   Social History  . Marital Status: Married    Spouse Name: Torry    Number of Children: 0  . Years of Education: Masters0   Occupational History  .  [employer] History Main Topics  . Smoking status: Never Smoker   . Smokeless tobacco: Never Used  . Alcohol Use: No  . Drug Use: No  . Sexual Activity: Not on file   Other Topics Concern  . 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    Patient has a Scientist, water quality.   Patient is right-handed.   Patient drinks one cup of soda daily.   Works as [employer]             Family History  Problem Relation Age of Onset  . Other Mother 57    MVA   . Hypertension Mother   . Hypertension Sister   . Hypertension Brother     Past Medical History  Diagnosis Date  . Pituitary adenoma   . Obesity   . Hypothyroidism     DR [name]  . HTN (hypertension)   . Glucose intolerance (impaired glucose tolerance)   . Allergic rhinitis   . Depression   . Hyperlipidemia   . Impaired glucose tolerance 10/04/2011  . Anxiety 02/27/2012  . Shortness of breath   . OSA (obstructive sleep apnea) 10/08/2014    Past Surgical History  Procedure Laterality Date  . Breast reduction surgery    . Dilation and curettage of uterus  07/2010    Hysteroscopy NEG w/Dr Mezer  . Anterior cervical decomp/discectomy fusion  11/18/2012    Procedure: ANTERIOR CERVICAL DECOMPRESSION/DISCECTOMY FUSION 1 LEVEL;  Surgeon: Hosie Spangle, MD;  Location: [facility];  Service: Neurosurgery;  Laterality: N/A;  Cervical five-six Anterior cervical decompression/diskectomy/fusion     Current Outpatient Prescriptions  Medication Sig Dispense Refill  . cabergoline (DOSTINEX) 0.5 MG tablet TAKE 1 TABLET  BY MOUTH EVERY MONDAY AND THURSDAY 24 tablet 0  . cholecalciferol (VITAMIN D) 1000 UNITS tablet Take 2,000 Units by mouth daily.    Marland Kitchen EPINEPHrine (EPIPEN 2-PAK) 0.3 mg/0.3 mL IJ SOAJ injection Inject 0.3 mLs (0.3 mg total) into the muscle once. 2 Device 1  . levothyroxine (SYNTHROID, LEVOTHROID) 25 MCG tablet TAKE 1 TABLET (25 MCG TOTAL) BY MOUTH DAILY. 90 tablet 2  . Multiple Vitamin (MULTIVITAMIN WITH MINERALS) TABS Take 1 tablet by mouth daily.    . nadolol-bendroflumethiazide (CORZIDE) 40-5 MG per tablet TAKE 1 TABLET BY MOUTH DAILY. 90 tablet 3  . sertraline (ZOLOFT) 100 MG tablet TAKE 2 tabs by mouth per day 180 tablet 1  .  zolpidem (AMBIEN) 10 MG tablet TAKE 1 TABLET BY MOUTH AT BEDTIME AS NEEDED FOR SLEEP 90 tablet 1  . [DISCONTINUED] fexofenadine (ALLEGRA) 180 MG tablet Take 180 mg by mouth daily.       No current facility-administered medications for this visit.    Allergies as of 10/28/2014 - Review Complete 10/28/2014  Allergen Reaction Noted  . Peanut-containing drug products  08/29/2014    Vitals: BP 151/85 mmHg  Pulse 77  Ht 5\' 5"  (1.651 m)  Wt 311 lb 3.2 oz (141.159 kg)  BMI 51.79 kg/m2 Last Weight:  Wt Readings from Last 1 Encounters:  10/28/14 311 lb 3.2 oz (141.159 kg)   Last Height:   Ht Readings from Last 1 Encounters:  10/28/14 5\' 5"  (1.651 m)   Physical exam: Exam: Gen: NAD, conversant, well nourised, obese, well groomed                     CV: RRR, no MRG. No Carotid Bruits. No peripheral edema, warm, nontender Eyes: Conjunctivae clear without exudates or hemorrhage  Neuro: Detailed Neurologic Exam  Speech:    Speech is normal; fluent and spontaneous with normal comprehension.  Cognition:    The patient is oriented to person, place, and time;     recent and remote memory intact;     language fluent;     normal attention, concentration,     fund of knowledge Cranial Nerves:    The pupils are equal, round, and reactive to light. The fundi are normal and spontaneous venous pulsations are present. Visual fields are full to finger confrontation. Extraocular movements are intact. Trigeminal sensation is intact and the muscles of mastication are normal. The face is symmetric. The palate elevates in the midline. Voice is normal. Shoulder shrug is normal. The tongue has normal motion without fasciculations.   Gait:    Normal native gait  Motor Observation:    No asymmetry, no atrophy, and no involuntary movements noted. Tone:    Normal muscle tone.    Posture:    Posture is normal. normal erect    Strength:    Strength is V/V in the upper and lower limbs.        Sensation: intact to LT     Reflex Exam:  DTR's:    Deep tendon reflexes in the upper and lower extremities are symmetric bilaterally.   Toes:    The toes are downgoing bilaterally.   Clonus:    Clonus is absent.  Assessment/Plan:  43 year old female here for memory problems, snoring, frequent awakenings, significantly affecting her life. She is teary in the office today. Recent sleep study showed AHI 40.3, RDI 40.3. Discussed OSA with patient, reassured her that all these symptoms can be as a result of her  OSA. She is picking up cpap tomorrow. She will need follow up with Dr. Brett Fairy in 6 weeks. Reviewed sleep study with patient.   A total of 75 minutes was spent in with this patient. Over half this time was spent on counseling patient on the diagnosis and different therapeutic options available.    Sarina Ill, MD  [facility] [address] [place], [place] [address]  Phone [phone] Fax [phone]

## 2014-11-09 ENCOUNTER — Institutional Professional Consult (permissible substitution): Payer: BC Managed Care – PPO | Admitting: Internal Medicine

## 2014-11-19 ENCOUNTER — Other Ambulatory Visit: Payer: Self-pay | Admitting: Internal Medicine

## 2014-11-20 NOTE — Telephone Encounter (Signed)
Faxed hardcopy for Zolpidem to [place] [facility]

## 2014-11-20 NOTE — Telephone Encounter (Signed)
Okay for refills.

## 2014-11-20 NOTE — Telephone Encounter (Signed)
Done hardcopy to [facility] - [facility] (other went to pharmacy - dostinex)

## 2014-12-07 ENCOUNTER — Encounter: Payer: Self-pay | Admitting: Neurology

## 2014-12-07 ENCOUNTER — Other Ambulatory Visit: Payer: Self-pay | Admitting: Internal Medicine

## 2014-12-09 ENCOUNTER — Ambulatory Visit (INDEPENDENT_AMBULATORY_CARE_PROVIDER_SITE_OTHER): Payer: BC Managed Care – PPO | Admitting: Neurology

## 2014-12-09 ENCOUNTER — Encounter: Payer: Self-pay | Admitting: Neurology

## 2014-12-09 VITALS — BP 147/83 | HR 67 | Resp 14 | Ht 64.5 in | Wt 311.0 lb

## 2014-12-09 DIAGNOSIS — G4733 Obstructive sleep apnea (adult) (pediatric): Secondary | ICD-10-CM | POA: Insufficient documentation

## 2014-12-09 DIAGNOSIS — E662 Morbid (severe) obesity with alveolar hypoventilation: Secondary | ICD-10-CM

## 2014-12-09 DIAGNOSIS — Z9989 Dependence on other enabling machines and devices: Secondary | ICD-10-CM

## 2014-12-09 MED ORDER — SUVOREXANT 20 MG PO TABS: 20.0000 mg | ORAL_TABLET | Freq: Every evening | ORAL | Status: DC

## 2014-12-09 NOTE — Progress Notes (Signed)
SLEEP MEDICINE CLINIC   Provider:  Larey Seat, M D  Referring Provider: Biagio Borg, MD Primary Care Physician:  Cathlean Cower, MD  Chief Complaint  Patient presents with  . RV sleep cpap    Rm '10, husband    HPI:  Chelsea Thompson is a 43 y.o.  Chelsea Thompson, married, right handed female , who is seen here as a referral from Dr. Jenny Reichmann for a sleep evaluation.  I would like to thank Dr. Jenny Reichmann for this referral and for the extensive notes that he provided for me. He said that the patient had been hospitalized recently after an allergic reaction. A great surprise she lives she is allergic to peanuts but she is concerned all her adult life and childhood. She was not placed on an EpiPen just in case. She had and you in the mouth with lip swelling tingling blisters on the lip spots on her face itching all over, especially in the feet - this happened after eating pizza, shellfish and peanuts.  She was seen in the emergency room and treated with prednisone and Vistaril.  Ongoing difficulties breathing and snoring at night. Upon further questioning her husband , he reported that she snores independent of allergy or not and so but he has also witnessed some apneas.  The patient has a history of impaired glucose tolerance, high cholesterol levels, allergic rhinitis, hypertension, hypothyroidism, and a pituitary adenoma.  She is by medical standarts is morbidly obese.   The patient usually goes to bed between 9.30 Pm and watches TV on a timer.  Watches her Mother , who in turn doesn't sleep until 3 AM.  She a has 2 nocturias in 4 hours . She wakes with headaches and with a dry mouth.   This care taking has exhausted her, interferes with her ability to concentrate to sleep and to relax. She is childless and thus became the caretaker of choice. Her siblings have children.  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  She rises at 7.30 AM and  has no caffeine, drinks only water, drives to work less than 10 minutes.  She doesn't  take naps.   12-09-14 Interval history . Mrs. Barbe underwent a split night polysomnography on 10-20 6-15. She was not excessively daytime sleepy but had risk factors such as hypothyroidism hypertension, impaired glucose tolerance and a BMI of 52.4, and neck circumference of 16 inches which is considered large for woman. For the diagnostic part of her CPAP study the patient's sleep latency was 40 minutes she retains CO2 diagnosing her with obesity hypoventilation syndrome she could not enter REM sleep or deep sleep and any other stage during this diagnostic part she had frequent apneas and hypotony as with an AHI of 40.3. In supine sleep her AHI was 80.0 in nonsupine sleep 36. She had no periodic limb movements and actually a very regular normal sinus rhythm on her EKG electrode. She was titrated to 8 cm water with a reduction of the AHI to 2 per hour of sleep a ResMed pico nasal mask was originally prescribed. It seems that the patient has some trouble with it also her machine, which was interrogated today, indicated a :-) she reports that ear leaks out and actually irritates her eyes.  Her Epworth sleepiness score today is 11 points, she has no longer any complaints of fragmented sleep and usually sleeps better. She contracted an upper respiratory  infection in mid December and had to these using CPAP for about a week. She will restart today. She is aware that she has to use the machine 4 hours or more each night for insurance coverage.  Due to her medical reasons of discontinuing temple rarely CPAP she should still fulfill the compliance criteria at 90 days. We discussed the sleep study results and be hyper the program. For her residual AHI while on CPAP she has a very good result at 2.5. The machine is set at 8 cm water was 1 cm EPR and the average usage right now is 3 hours and 42 minutes at night again this is  including almost  14 days of not using the machine.   I have discussed with Mrs. Chelsea Thompson that I will prescribe her a dream where nasal mask that will eliminate the for head pressure point but she also has reported as bothersome.    Review of Systems: Out of a complete 14 system review, the patient complains of only the following symptoms, and all other reviewed systems are negative. Snoring, fatigue, sleep deprivation,  Insomnia.   Epworth score 11 while off CPAP and  previous 4 on CPAP .   Fatigue severity score 33 , depression score 3 points.    History   Social History  . Marital Status: Married    Spouse Name: Torry    Number of Children: 0  . Years of Education: Masters0   Occupational History  .  [place] History Main Topics  . Smoking status: Never Smoker   . Smokeless tobacco: Never Used  . Alcohol Use: No  . Drug Use: No  . Sexual Activity: Not on file   Other Topics Concern  . 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   Patient has a Scientist, water quality.   Patient is right-handed.   Patient drinks one cup of soda daily.   Works as [employer]             Family History  Problem Relation Age of Onset  . Other Mother 64    MVA   . Hypertension Mother   . Hypertension Sister   . Hypertension Brother     Past Medical History  Diagnosis Date  . Pituitary adenoma   . Obesity   . Hypothyroidism     DR [name]  . HTN (hypertension)   . Glucose intolerance (impaired glucose tolerance)   . Allergic rhinitis   . Depression   . Hyperlipidemia   . Impaired glucose tolerance 10/04/2011  . Anxiety 02/27/2012  . Shortness of breath   . OSA (obstructive sleep apnea) 10/08/2014    Past Surgical History  Procedure Laterality Date  . Breast reduction surgery    . Dilation and curettage of uterus  07/2010    Hysteroscopy NEG w/Dr Mezer    . Anterior cervical decomp/discectomy fusion  11/18/2012    Procedure: ANTERIOR CERVICAL DECOMPRESSION/DISCECTOMY FUSION 1 LEVEL;  Surgeon: Hosie Spangle, MD;  Location: [facility];  Service: Neurosurgery;  Laterality: N/A;  Cervical five-six Anterior cervical decompression/diskectomy/fusion     Current Outpatient Prescriptions  Medication Sig Dispense Refill  . cabergoline (DOSTINEX) 0.5 MG tablet TAKE 1 TABLET BY MOUTH EVERY MONDAY AND THURSDAY 24 tablet 5  . cholecalciferol (VITAMIN D) 1000 UNITS tablet Take 2,000 Units by mouth daily.    Marland Kitchen  EPINEPHrine (EPIPEN 2-PAK) 0.3 mg/0.3 mL IJ SOAJ injection Inject 0.3 mLs (0.3 mg total) into the muscle once. 2 Device 1  . levothyroxine (SYNTHROID, LEVOTHROID) 25 MCG tablet TAKE 1 TABLET (25 MCG TOTAL) BY MOUTH DAILY. 90 tablet 2  . Multiple Vitamin (MULTIVITAMIN WITH MINERALS) TABS Take 1 tablet by mouth daily.    . nadolol-bendroflumethiazide (CORZIDE) 40-5 MG per tablet TAKE 1 TABLET BY MOUTH DAILY. 90 tablet 3  . sertraline (ZOLOFT) 100 MG tablet TAKE 2 tabs by mouth per day 180 tablet 1  . [DISCONTINUED] fexofenadine (ALLEGRA) 180 MG tablet Take 180 mg by mouth daily.       No current facility-administered medications for this visit.    Allergies as of 12/09/2014 - Review Complete 12/09/2014  Allergen Reaction Noted  . Other Anaphylaxis 12/09/2014  . Peanut-containing drug products  08/29/2014    Vitals: BP 147/83 mmHg  Pulse 67  Resp 14  Ht 5' 4.5" (1.638 m)  Wt 311 lb (141.069 kg)  BMI 52.58 kg/m2 Last Weight:  Wt Readings from Last 1 Encounters:  12/09/14 311 lb (141.069 kg)       Last Height:   Ht Readings from Last 1 Encounters:  12/09/14 5' 4.5" (1.638 m)    Physical exam:  General: The patient is awake, alert and appears not in acute distress. The patient is well groomed. Head: Normocephalic, atraumatic. Neck is supple. Mallampati 3 ,  neck circumference: 16 inches . Nasal airflow  unrestricted , TMJ is not   evident . Retrognathia is not seen.  Cardiovascular:  Regular rate and rhythm , without  murmurs or carotid bruit, and without distended neck veins. Respiratory: Lungs are clear to auscultation. Skin:  Without evidence of edema, or rash Trunk: BMI is  elevated and patient  has normal posture.  Neurologic exam : The patient is awake and alert, oriented to place and time.   Memory subjective  described as intact. There is a fatigue impaired attention span & concentration ability.  Speech is fluent without  dysarthria, dysphonia or aphasia. Mood and affect are appropriate.  Cranial nerves: Pupils are equal and briskly reactive to light. Funduscopic exam without evidence of pallor or edema.  Extraocular movements  in vertical and horizontal planes intact and without nystagmus. Visual fields by finger perimetry are intact. Hearing to finger rub intact.  Facial sensation intact to fine touch. Facial motor strength is symmetric and tongue and uvula move midline.  Motor exam: Normal tone, muscle bulk and symmetric, strength in all extremities.  Sensory:  Fine touch, pinprick and vibration were tested in all extremities. Proprioception is normal.  No  ataxia, dysmetria or tremor.  Gait and station: Patient walks without assistive device and is able unassisted to climb up to the exam table. Strength within normal limits. Stance is stable and normal.  Deep tendon reflexes: in the  upper and lower extremities are symmetric and intact. Babinski maneuver response is  downgoing.   Assessment:  After physical and neurologic examination, review of laboratory studies, imaging, neurophysiology testing and pre-existing records, assessment is  1) OSA , severe  2)with obesity hypoventilation syndrome, CO2 retention.   3) Obesity .  1) extremely fatigued patient with sleep deprivation as main cause. Caregiver burden, reduced sleep time . In addition, BMI  snoring and nocturia, morning headaches, all OSA  risk factors. Weight loss.     The patient was advised of the nature of the diagnosed sleep disorder , the treatment options and risks for  general a health and wellness arising from not treating the  OSA condition. Visit duration was 45  minutes.   Plan:  Treatment plan and additional workup :  SPLIT study with CO2 .  AHI split at 15 and scoe at 3% , get some supine sleep before 1.30 AM. Appointment parallel to her mothers sleep study appointment .  Belsomra prescribed at 20 mg.    Asencion Partridge Ichiro Chesnut MD  12/09/2014

## 2014-12-09 NOTE — Patient Instructions (Signed)

## 2014-12-28 ENCOUNTER — Ambulatory Visit (INDEPENDENT_AMBULATORY_CARE_PROVIDER_SITE_OTHER): Payer: BC Managed Care – PPO | Admitting: Internal Medicine

## 2014-12-28 ENCOUNTER — Other Ambulatory Visit: Payer: BC Managed Care – PPO

## 2014-12-28 ENCOUNTER — Encounter: Payer: Self-pay | Admitting: Internal Medicine

## 2014-12-28 VITALS — BP 144/82 | HR 63 | Ht 65.0 in | Wt 313.8 lb

## 2014-12-28 DIAGNOSIS — Z91018 Allergy to other foods: Secondary | ICD-10-CM

## 2014-12-28 DIAGNOSIS — J309 Allergic rhinitis, unspecified: Secondary | ICD-10-CM

## 2014-12-28 DIAGNOSIS — R21 Rash and other nonspecific skin eruption: Secondary | ICD-10-CM

## 2014-12-28 DIAGNOSIS — Z9989 Dependence on other enabling machines and devices: Secondary | ICD-10-CM

## 2014-12-28 DIAGNOSIS — G4733 Obstructive sleep apnea (adult) (pediatric): Secondary | ICD-10-CM

## 2014-12-28 NOTE — Progress Notes (Signed)
12/28/14- 57 yoF never smoker Referred by Dr. Jenny Reichmann; allergy with hx allergic rhinitis She asks evaluation for allergies in general but is specifically concerned about apparent allergic reaction to peanuts and shrimp over the past 5 months she has had, for the first time, 2 episodes eating peanuts when she had itching in her mouth, swelling of lips and rash around her mouth. May of had a similar episode once associated with eating shrimp on a pizza possibly cooked with peanut oil.Marland Kitchen Has never thought of herself as a particularly allergic person. Little pollen allergy. May of had hives as a child. Not aware of any diagnosis of asthma, unusual reaction to insect stings, aspirin, contact materials. Good overall health with treatment for hypertension and sleep apnea/insomnia. She is married, working as a Pharmacist, hospital. No unusual exposures.  Prior to Admission medications   Medication Sig Start Date End Date Taking? Authorizing Provider  cabergoline (DOSTINEX) 0.5 MG tablet TAKE 1 TABLET BY MOUTH EVERY MONDAY AND THURSDAY 11/20/14  Yes Biagio Borg, MD  cholecalciferol (VITAMIN D) 1000 UNITS tablet Take 2,000 Units by mouth daily.   Yes Historical Provider, MD  EPINEPHrine (EPIPEN 2-PAK) 0.3 mg/0.3 mL IJ SOAJ injection Inject 0.3 mLs (0.3 mg total) into the muscle once. 08/27/14  Yes Biagio Borg, MD  levothyroxine (SYNTHROID, LEVOTHROID) 25 MCG tablet TAKE 1 TABLET (25 MCG TOTAL) BY MOUTH DAILY. 10/10/13  Yes Biagio Borg, MD  Multiple Vitamin (MULTIVITAMIN WITH MINERALS) TABS Take 1 tablet by mouth daily.   Yes Historical Provider, MD  nadolol-bendroflumethiazide (CORZIDE) 40-5 MG per tablet TAKE 1 TABLET BY MOUTH DAILY. 08/19/14  Yes Biagio Borg, MD  sertraline (ZOLOFT) 100 MG tablet TAKE 2 tabs by mouth per day Patient taking differently: TAKE 1 1/2  tabs by mouth per day 08/27/14  Yes Biagio Borg, MD  Suvorexant (BELSOMRA) 20 MG TABS Take 20 mg by mouth Nightly. 12/09/14  Yes Larey Seat, MD   Past  Medical History  Diagnosis Date  . Pituitary adenoma   . Obesity   . Hypothyroidism     DR [name]  . HTN (hypertension)   . Glucose intolerance (impaired glucose tolerance)   . Allergic rhinitis   . Depression   . Hyperlipidemia   . Impaired glucose tolerance 10/04/2011  . Anxiety 02/27/2012  . Shortness of breath   . OSA (obstructive sleep apnea) 10/08/2014   Past Surgical History  Procedure Laterality Date  . Breast reduction surgery    . Dilation and curettage of uterus  07/2010    Hysteroscopy NEG w/Dr Mezer  . Anterior cervical decomp/discectomy fusion  11/18/2012    Procedure: ANTERIOR CERVICAL DECOMPRESSION/DISCECTOMY FUSION 1 LEVEL;  Surgeon: Hosie Spangle, MD;  Location: [facility];  Service: Neurosurgery;  Laterality: N/A;  Cervical five-six Anterior cervical decompression/diskectomy/fusion    Family History  Problem Relation Age of Onset  . Other Mother 70    MVA   . Hypertension Mother   . Hypertension Sister   . Hypertension Brother   . Cancer Brother     Prostate   History   Social History  . Marital Status: Married    Spouse Name: Torry    Number of Children: 0  . Years of Education: Masters0   Occupational History  .  [place] History Main Topics  . Smoking status: Never Smoker   . Smokeless tobacco: Never Used  . Alcohol Use: No  . Drug Use: No  .  Sexual Activity: Not on file   Other Topics Concern  . 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   Patient has a Scientist, water quality.   Patient is right-handed.   Patient drinks one cup of soda daily.   Works as [employer]            ROS-see HPI Constitutional:   No-   weight loss, night sweats, fevers, chills, fatigue, lassitude. HEENT:   No-  headaches, difficulty swallowing, tooth/dental problems, sore throat,       + sneezing, itching, ear ache, +nasal  congestion, post nasal drip,  CV:  No-   chest pain, orthopnea, PND, swelling in lower extremities, anasarca,                                  dizziness, palpitations Resp: No-   shortness of breath with exertion or at rest.              No-   productive cough,  No non-productive cough,  No- coughing up of blood.              No-   change in color of mucus.  No- wheezing.   Skin: + rash or lesions. GI:  No-   heartburn, indigestion, abdominal pain, nausea, vomiting, diarrhea,                 change in bowel habits, loss of appetite GU: No-   dysuria, change in color of urine, no urgency or frequency.  No- flank pain. MS:  No-   joint pain or swelling.  No- decreased range of motion.  No- back pain. Neuro-     nothing unusual Psych:  No- change in mood or affect. No depression or anxiety.  No memory loss.  OBJ- Physical Exam General- Alert, Oriented, Affect-appropriate, Distress- none acute, + obese Skin- rash-none, lesions- none, excoriation- none Lymphadenopathy- none Head- atraumatic            Eyes- Gross vision intact, PERRLA, conjunctivae and secretions clear            Ears- Hearing, canals-normal            Nose- Clear, no-Septal dev, mucus, polyps, erosion, perforation             Throat- Mallampati II , mucosa clear , drainage- none, tonsils- atrophic Neck- flexible , trachea midline, no stridor , thyroid nl, carotid no bruit Chest - symmetrical excursion , unlabored           Heart/CV- RRR , no murmur , no gallop  , no rub, nl s1 s2                           - JVD- none , edema- none, stasis changes- none, varices- none           Lung- clear to P&A, wheeze- none, cough- none , dullness-none, rub- none           Chest wall-  Abd- tender-no, distended-no, bowel sounds-present, HSM- no Br/ Gen/ Rectal- Not done, not indicated Extrem- cyanosis- none, clubbing, none, atrophy- none, strength- nl Neuro- grossly intact to observation

## 2014-12-29 DIAGNOSIS — Z91018 Allergy to other foods: Secondary | ICD-10-CM | POA: Insufficient documentation

## 2014-12-29 LAB — ALLERGEN SEAFOOD PANEL
Clams: <0.1 kU/L
Crab: <0.1 kU/L
Crayfish: <0.1 kU/L
Lobster: <0.1 kU/L
Oyster: <0.1 kU/L
Scallop IgE: <0.1 kU/L
Shrimp IgE: <0.1 kU/L
Tuna IgE: <0.1 kU/L

## 2014-12-29 LAB — NUT MIX PROFILE
Almonds: <0.1 kU/L
Brazil Nut: <0.1 kU/L
Cashew IgE: <0.1 kU/L
Hazelnut: <0.1 kU/L
Peanut IgE: <0.1 kU/L
Pecan Nut: <0.1 kU/L
Pistachio  IgE: <0.1 kU/L
Walnut: <0.1 kU/L

## 2014-12-29 LAB — MOLD PROFILE
Alternaria Alternata: 0.21 kU/L — ABNORMAL HIGH
Aspergillus fumigatus, m3: <0.1 kU/L
Cladosporium Herbarum: <0.1 kU/L
Helminthosporium halodes: 0.13 kU/L — ABNORMAL HIGH
Penicillium Notatum: <0.1 kU/L

## 2014-12-29 NOTE — Patient Instructions (Signed)
Order-lab-Allergy profiles-food, not, seafood, mold Avoid exposure to foods that cause problems. Antihistamines as discussed.

## 2014-12-29 NOTE — Assessment & Plan Note (Signed)
Managed elsewhere 

## 2014-12-29 NOTE — Assessment & Plan Note (Addendum)
She is associating acute symptoms rhinitis/stomatitis/rash/angioedema with shrimp and peanut. Education done on food allergy versus intolerance, issues of multiple foods at 1 exposure/cross-contamination, role of additives such as monosodium glutamate. Treatment emphasizes prevention of exposure. Plan-allergy profiles for foods, nuts, seafood, mold(added at her request), antihistamines discussed.

## 2015-01-01 LAB — FOOD ALLERGY PROFILE
Allergen Corn, IgE: <0.1 kU/L
Clam IgE: <0.1 kU/L
Codfish IgE: <0.1 kU/L
Egg White IgE: 0.11 kU/L — AB
Milk IgE: 0.18 kU/L — AB
Peanut IgE: <0.1 kU/L
Scallop IgE: <0.1 kU/L
Sesame Seed IgE: <0.1 kU/L
Shrimp IgE: <0.1 kU/L
Soybean IgE: <0.1 kU/L
Walnut IgE: <0.1 kU/L
Wheat IgE: <0.1 kU/L

## 2015-01-08 ENCOUNTER — Encounter: Payer: Self-pay | Admitting: *Deleted

## 2015-02-02 ENCOUNTER — Telehealth: Payer: Self-pay | Admitting: Neurology

## 2015-02-02 NOTE — Telephone Encounter (Signed)
Patient requesting samples for Rx Suvorexant (BELSOMRA) 20 MG TABS.  Please call and advise.

## 2015-02-03 NOTE — Telephone Encounter (Signed)
OK to dispense samples, of 20 MG BELSOMRA. , 9 tabs.  Please tell her these are at check in table for her to pick up.

## 2015-02-03 NOTE — Telephone Encounter (Signed)
I called the patient back.  Got no answer.  Left message. 

## 2015-02-10 ENCOUNTER — Other Ambulatory Visit: Payer: Self-pay | Admitting: Internal Medicine

## 2015-02-23 ENCOUNTER — Encounter: Payer: BC Managed Care – PPO | Admitting: Internal Medicine

## 2015-03-10 ENCOUNTER — Ambulatory Visit: Payer: BC Managed Care – PPO | Admitting: Neurology

## 2015-03-11 ENCOUNTER — Encounter: Payer: Self-pay | Admitting: Internal Medicine

## 2015-05-07 ENCOUNTER — Other Ambulatory Visit: Payer: Self-pay | Admitting: Internal Medicine

## 2015-06-01 ENCOUNTER — Other Ambulatory Visit: Payer: Self-pay | Admitting: Internal Medicine

## 2015-06-16 ENCOUNTER — Telehealth: Payer: Self-pay | Admitting: Internal Medicine

## 2015-06-16 MED ORDER — LEVOTHYROXINE SODIUM 25 MCG PO TABS: 25.0000 ug | ORAL_TABLET | Freq: Every day | ORAL | Status: DC

## 2015-06-16 NOTE — Telephone Encounter (Signed)
Patient need a refill of her levothyroxine

## 2015-06-28 ENCOUNTER — Other Ambulatory Visit: Payer: Self-pay | Admitting: Internal Medicine

## 2015-06-28 NOTE — Telephone Encounter (Signed)
Patients last office visit nov/2015---are you ok with refilling--please advise, thanks

## 2015-06-30 NOTE — Telephone Encounter (Signed)
[place] for both  [employer] to dahlia/LIM B

## 2015-06-30 NOTE — Telephone Encounter (Signed)
Rx faxed to pharmacy  

## 2015-10-09 ENCOUNTER — Other Ambulatory Visit: Payer: Self-pay | Admitting: Internal Medicine

## 2016-03-16 ENCOUNTER — Telehealth: Payer: Self-pay | Admitting: *Deleted

## 2016-03-16 NOTE — Telephone Encounter (Signed)
Left msg on triage stating she is needing refills on her Levothyroxine & Sertraline. Called pt back no answer LMOM over due for appt last seen MD 2015. Need appt for refills...Chelsea Thompson

## 2016-04-06 ENCOUNTER — Ambulatory Visit: Payer: Self-pay | Admitting: Internal Medicine

## 2016-04-07 ENCOUNTER — Other Ambulatory Visit (INDEPENDENT_AMBULATORY_CARE_PROVIDER_SITE_OTHER): Payer: Self-pay

## 2016-04-07 ENCOUNTER — Encounter: Payer: Self-pay | Admitting: Internal Medicine

## 2016-04-07 ENCOUNTER — Ambulatory Visit (INDEPENDENT_AMBULATORY_CARE_PROVIDER_SITE_OTHER): Payer: Self-pay | Admitting: Internal Medicine

## 2016-04-07 ENCOUNTER — Telehealth: Payer: Self-pay

## 2016-04-07 ENCOUNTER — Other Ambulatory Visit: Payer: Self-pay | Admitting: Internal Medicine

## 2016-04-07 VITALS — BP 136/80 | HR 118 | Resp 18 | Wt 315.0 lb

## 2016-04-07 DIAGNOSIS — R059 Cough, unspecified: Secondary | ICD-10-CM

## 2016-04-07 DIAGNOSIS — Z0001 Encounter for general adult medical examination with abnormal findings: Secondary | ICD-10-CM

## 2016-04-07 DIAGNOSIS — K219 Gastro-esophageal reflux disease without esophagitis: Secondary | ICD-10-CM

## 2016-04-07 DIAGNOSIS — R05 Cough: Secondary | ICD-10-CM

## 2016-04-07 DIAGNOSIS — R7302 Impaired glucose tolerance (oral): Secondary | ICD-10-CM

## 2016-04-07 DIAGNOSIS — I10 Essential (primary) hypertension: Secondary | ICD-10-CM

## 2016-04-07 DIAGNOSIS — F419 Anxiety disorder, unspecified: Secondary | ICD-10-CM

## 2016-04-07 DIAGNOSIS — R6889 Other general symptoms and signs: Secondary | ICD-10-CM

## 2016-04-07 LAB — CBC WITH DIFFERENTIAL/PLATELET
Basophils Absolute: 0 10*3/uL (ref 0.0–0.1)
Basophils Relative: 0.5 % (ref 0.0–3.0)
Eosinophils Absolute: 0.5 10*3/uL (ref 0.0–0.7)
Eosinophils Relative: 6.4 % — ABNORMAL HIGH (ref 0.0–5.0)
HCT: 36.4 % (ref 36.0–46.0)
Hemoglobin: 12.2 g/dL (ref 12.0–15.0)
Lymphocytes Relative: 23.2 % (ref 12.0–46.0)
Lymphs Abs: 1.7 10*3/uL (ref 0.7–4.0)
MCHC: 33.4 g/dL (ref 30.0–36.0)
MCV: 77.3 fl — ABNORMAL LOW (ref 78.0–100.0)
Monocytes Absolute: 1.1 10*3/uL — ABNORMAL HIGH (ref 0.1–1.0)
Monocytes Relative: 15.3 % — ABNORMAL HIGH (ref 3.0–12.0)
Neutro Abs: 3.9 10*3/uL (ref 1.4–7.7)
Neutrophils Relative %: 54.6 % (ref 43.0–77.0)
Platelets: 320 10*3/uL (ref 150.0–400.0)
RBC: 4.71 Mil/uL (ref 3.87–5.11)
RDW: 14.5 % (ref 11.5–15.5)
WBC: 7.2 10*3/uL (ref 4.0–10.5)

## 2016-04-07 LAB — BASIC METABOLIC PANEL
BUN: 7 mg/dL (ref 6–23)
CO2: 28 mEq/L (ref 19–32)
Calcium: 9.4 mg/dL (ref 8.4–10.5)
Chloride: 102 mEq/L (ref 96–112)
Creatinine, Ser: 0.66 mg/dL (ref 0.40–1.20)
GFR: 125.4 mL/min (ref >60.00)
Glucose, Bld: 82 mg/dL (ref 70–99)
Potassium: 3.3 mEq/L — ABNORMAL LOW (ref 3.5–5.1)
Sodium: 137 mEq/L (ref 135–145)

## 2016-04-07 LAB — LIPID PANEL
Cholesterol: 237 mg/dL — ABNORMAL HIGH (ref 0–200)
HDL: 64.8 mg/dL (ref >39.00)
LDL Cholesterol: 150 mg/dL — ABNORMAL HIGH (ref 0–99)
NonHDL: 171.7
Total CHOL/HDL Ratio: 4
Triglycerides: 109 mg/dL (ref 0.0–149.0)
VLDL: 21.8 mg/dL (ref 0.0–40.0)

## 2016-04-07 LAB — TSH: TSH: 1.14 u[IU]/mL (ref 0.35–4.50)

## 2016-04-07 LAB — URINALYSIS, ROUTINE W REFLEX MICROSCOPIC
Bilirubin Urine: NEGATIVE
Hgb urine dipstick: NEGATIVE
Ketones, ur: NEGATIVE
Leukocytes, UA: NEGATIVE
Nitrite: NEGATIVE
Specific Gravity, Urine: 1.015 (ref 1.000–1.030)
Total Protein, Urine: NEGATIVE
Urine Glucose: NEGATIVE
Urobilinogen, UA: 0.2 (ref 0.0–1.0)
pH: 6 (ref 5.0–8.0)

## 2016-04-07 LAB — HEMOGLOBIN A1C: Hgb A1c MFr Bld: 5.4 % (ref 4.6–6.5)

## 2016-04-07 LAB — HEPATIC FUNCTION PANEL
ALT: 49 U/L — ABNORMAL HIGH (ref 0–35)
AST: 33 U/L (ref 0–37)
Albumin: 4.2 g/dL (ref 3.5–5.2)
Alkaline Phosphatase: 163 U/L — ABNORMAL HIGH (ref 39–117)
Bilirubin, Direct: 0.1 mg/dL (ref 0.0–0.3)
Total Bilirubin: 0.4 mg/dL (ref 0.2–1.2)
Total Protein: 8.3 g/dL (ref 6.0–8.3)

## 2016-04-07 MED ORDER — EPINEPHRINE 0.3 MG/0.3ML IJ SOAJ: INTRAMUSCULAR | Status: DC

## 2016-04-07 MED ORDER — POTASSIUM CHLORIDE ER 10 MEQ PO TBCR: 10.0000 meq | EXTENDED_RELEASE_TABLET | Freq: Every day | ORAL | Status: DC

## 2016-04-07 MED ORDER — HYDROCODONE-HOMATROPINE 5-1.5 MG/5ML PO SYRP: 5.0000 mL | ORAL_SOLUTION | Freq: Four times a day (QID) | ORAL | Status: DC | PRN

## 2016-04-07 MED ORDER — LEVOTHYROXINE SODIUM 25 MCG PO TABS: ORAL_TABLET | ORAL | Status: DC

## 2016-04-07 MED ORDER — NADOLOL-BENDROFLUMETHIAZIDE 80-5 MG PO TABS: 1.0000 | ORAL_TABLET | Freq: Every day | ORAL | Status: DC

## 2016-04-07 MED ORDER — NADOLOL-BENDROFLUMETHIAZIDE 80-5 MG PO TABS
1.0000 | ORAL_TABLET | Freq: Every day | ORAL | Status: DC
Start: 2016-04-07 — End: 2016-04-07

## 2016-04-07 MED ORDER — SERTRALINE HCL 100 MG PO TABS: 200.0000 mg | ORAL_TABLET | Freq: Every day | ORAL | Status: DC

## 2016-04-07 MED ORDER — LEVOFLOXACIN 500 MG PO TABS: 500.0000 mg | ORAL_TABLET | Freq: Every day | ORAL | Status: DC

## 2016-04-07 NOTE — Patient Instructions (Signed)
Please take all new medication as prescribed  - the antibiotic, and cough medicine  OK to stop the labetolol  Please take all new medication as prescribed - the corzide, and the protonix  OK to increase the zoloft to 200 mg  Please continue all other medications as before, and refills have been done if requested - in hardcopy  You are given the work note as well  Please have the pharmacy call with any other refills you may need.  Please continue your efforts at being more active, low cholesterol diet, and weight control.  You are otherwise up to date with prevention measures today.  Please keep your appointments with your specialists as you may have planned  Please go to the LAB in the Basement (turn left off the elevator) for the tests to be done today  You will be contacted by phone if any changes need to be made immediately.  Otherwise, you will receive a letter about your results with an explanation, but please check with MyChart first.  Please remember to sign up for MyChart if you have not done so, as this will be important to you in the future with finding out test results, communicating by private email, and scheduling acute appointments online when needed.  Please return in 6 months, or sooner if needed

## 2016-04-07 NOTE — Telephone Encounter (Signed)
Retook bp after patient had visit with dr Corena Herter has been documented at 162/132---dr Jenny Reichmann advised, and per dr Jenny Reichmann he has already given bp rx refills and patient has been instructed to take bp meds---go to ED if experiencing symptoms.

## 2016-04-07 NOTE — Progress Notes (Signed)
Pre visit review using our clinic review tool, if applicable. No additional management support is needed unless otherwise documented below in the visit note. 

## 2016-04-07 NOTE — Progress Notes (Signed)
Subjective:    Patient ID: Chelsea Thompson, female    DOB: 23-Nov-1971, 45 y.o.   MRN: [id number]  HPI Here for wellness and f/u;  Overall doing ok;  Pt denies Chest pain, worsening SOB, DOE, wheezing, orthopnea, PND, worsening LE edema, palpitations, dizziness or syncope.  Pt denies neurological change such as new headache, facial or extremity weakness.  Pt denies polydipsia, polyuria, or low sugar symptoms. Pt states overall good compliance with treatment and medications, good tolerability, and has been trying to follow appropriate diet.  Pt denies worsening depressive symptoms, suicidal ideation or panic. No fever, night sweats, wt loss, loss of appetite, or other constitutional symptoms.  Pt states good ability with ADL's, has low fall risk, home safety reviewed and adequate, no other significant changes in hearing or vision, and only occasionally active with exercise.   BP has been mild elevated at home despite relatively good reading today. BP Readings from Last 3 Encounters:  04/07/16 136/80  12/28/14 144/82  12/09/14 147/83  incidentally - Here with acute onset mild to mod 2-3 days ST, HA, general weakness and malaise, with prod cough greenish sputum. .Denies worsening depressive symptoms, suicidal ideation, or panic; but has ongoing anxiety, with increaesd stressors, asks for increased zoloft.   Has also mild worsening reflux x several months, but no abd pain, dysphagia, n/v, bowel change or blood. Past Medical History  Diagnosis Date  . Pituitary adenoma ([place])   . Obesity   . Hypothyroidism     DR [name]  . HTN (hypertension)   . Glucose intolerance (impaired glucose tolerance)   . Allergic rhinitis   . Depression   . Hyperlipidemia   . Impaired glucose tolerance 10/04/2011  . Anxiety 02/27/2012  . Shortness of breath   . OSA (obstructive sleep apnea) 10/08/2014   Past Surgical History  Procedure Laterality Date  . Breast reduction surgery    . Dilation and curettage of  uterus  07/2010    Hysteroscopy NEG w/Dr Mezer  . Anterior cervical decomp/discectomy fusion  11/18/2012    Procedure: ANTERIOR CERVICAL DECOMPRESSION/DISCECTOMY FUSION 1 LEVEL;  Surgeon: Hosie Spangle, MD;  Location: [place] [facility];  Service: Neurosurgery;  Laterality: N/A;  Cervical five-six Anterior cervical decompression/diskectomy/fusion     reports that she has never smoked. She has never used smokeless tobacco. She reports that she does not drink alcohol or use illicit drugs. family history includes Cancer in her brother; Hypertension in her brother, mother, and sister; Other (age of onset: 29) in her mother. Allergies  Allergen Reactions  . Other Anaphylaxis    shellfish  . Peanut-Containing Drug Products    Current Outpatient Prescriptions on File Prior to Visit  Medication Sig Dispense Refill  . cabergoline (DOSTINEX) 0.5 MG tablet TAKE 1 TABLET BY MOUTH EVERY MONDAY AND THURSDAY 24 tablet 5  . cholecalciferol (VITAMIN D) 1000 UNITS tablet Take 2,000 Units by mouth daily.    . Multiple Vitamin (MULTIVITAMIN WITH MINERALS) TABS Take 1 tablet by mouth daily.    Marland Kitchen zolpidem (AMBIEN) 10 MG tablet TAKE 1 TABLET BY MOUTH AT BEDTIME AS NEEDED FOR SLEEP 90 tablet 1  . [DISCONTINUED] fexofenadine (ALLEGRA) 180 MG tablet Take 180 mg by mouth daily.       No current facility-administered medications on file prior to visit.   Review of Systems Constitutional: Negative for increased diaphoresis, or other activity, appetite or siginficant weight change other than noted HENT: Negative for worsening hearing loss, ear pain, facial swelling, mouth sores  and neck stiffness.   Eyes: Negative for other worsening pain, redness or visual disturbance.  Respiratory: Negative for choking or stridor Cardiovascular: Negative for other chest pain and palpitations.  Gastrointestinal: Negative for worsening diarrhea, blood in stool, or abdominal distention Genitourinary: Negative for hematuria, flank pain  or change in urine volume.  Musculoskeletal: Negative for myalgias or other joint complaints.  Skin: Negative for other color change and wound or drainage.  Neurological: Negative for syncope and numbness. other than noted Hematological: Negative for adenopathy. or other swelling Psychiatric/Behavioral: Negative for hallucinations, SI, self-injury, decreased concentration or other worsening agitation.      Objective:   Physical Exam BP 136/80 mmHg  Pulse 118  Resp 18  Wt 315 lb (142.883 kg)  SpO2 98% VS noted, mild ill Constitutional: Pt is oriented to person, place, and time. Appears well-developed and well-nourished, in no significant distress Head: Normocephalic and atraumatic  Eyes: Conjunctivae and EOM are normal. Pupils are equal, round, and reactive to light Right Ear: External ear normal.  Left Ear: External ear normal Nose: Nose normal.  Mouth/Throat: Oropharynx is clear and moist  Bilat tm's with mild erythema.  Max sinus areas non tender.  Pharynx with mild erythema, no exudate Neck: Normal range of motion. Neck supple. No JVD present. No tracheal deviation present or significant neck LA or mass Cardiovascular: Normal rate, regular rhythm, normal heart sounds and intact distal pulses.   Pulmonary/Chest: Effort normal and breath sounds without rales or wheezing  Abdominal: Soft. Bowel sounds are normal. NT. No HSM  Musculoskeletal: Normal range of motion. Exhibits no edema Lymphadenopathy: Has no cervical adenopathy.  Neurological: Pt is alert and oriented to person, place, and time. Pt has normal reflexes. No cranial nerve deficit. Motor grossly intact Skin: Skin is warm and dry. No rash noted or new ulcers Psychiatric:  Has mod nervous mood and affect. Behavior is normal.     Assessment & Plan:

## 2016-04-08 ENCOUNTER — Encounter: Payer: Self-pay | Admitting: Internal Medicine

## 2016-04-08 DIAGNOSIS — R05 Cough: Secondary | ICD-10-CM | POA: Insufficient documentation

## 2016-04-08 DIAGNOSIS — R059 Cough, unspecified: Secondary | ICD-10-CM | POA: Insufficient documentation

## 2016-04-08 DIAGNOSIS — K219 Gastro-esophageal reflux disease without esophagitis: Secondary | ICD-10-CM | POA: Insufficient documentation

## 2016-04-08 HISTORY — DX: Gastro-esophageal reflux disease without esophagitis: K21.9

## 2016-04-08 NOTE — Assessment & Plan Note (Signed)
Mild to mod, for increassed zoloft to 200 qd,  to f/u any worsening symptoms or concerns

## 2016-04-08 NOTE — Assessment & Plan Note (Signed)
Mild to mod, for start PPI,  to f/u any worsening symptoms or concerns

## 2016-04-08 NOTE — Assessment & Plan Note (Signed)
stable overall by history and exam, recent data reviewed with pt, and pt to continue medical treatment as before,  to f/u any worsening symptoms or concerns Lab Results  Component Value Date   HGBA1C 5.4 04/07/2016

## 2016-04-08 NOTE — Assessment & Plan Note (Signed)

## 2016-04-08 NOTE — Assessment & Plan Note (Signed)
Mild uncontrolled, for change med back to corzide which worked well for her in the past,  D/c labetolol, no longer trying to be pregnant, to f/u any worsening symptoms or concerns

## 2016-04-08 NOTE — Assessment & Plan Note (Addendum)
Mild to mod, c/w bronchitis vs pna, declines cxr, for antibx, cough med prn,  to f/u any worsening symptoms or concerns  In addition to the time spent performing CPE, I spent an additional 40 minutes face to face,in which greater than 50% of this time was spent in counseling and coordination of care for patient's acute illness as documented.

## 2016-12-13 ENCOUNTER — Encounter: Payer: Self-pay | Admitting: Internal Medicine

## 2017-01-17 ENCOUNTER — Encounter: Payer: Self-pay | Admitting: Internal Medicine

## 2017-02-16 ENCOUNTER — Other Ambulatory Visit (INDEPENDENT_AMBULATORY_CARE_PROVIDER_SITE_OTHER): Payer: Self-pay

## 2017-02-16 ENCOUNTER — Encounter: Payer: Self-pay | Admitting: Internal Medicine

## 2017-02-16 ENCOUNTER — Other Ambulatory Visit: Payer: Self-pay | Admitting: Internal Medicine

## 2017-02-16 ENCOUNTER — Ambulatory Visit (INDEPENDENT_AMBULATORY_CARE_PROVIDER_SITE_OTHER): Payer: Self-pay | Admitting: Internal Medicine

## 2017-02-16 VITALS — BP 126/84 | HR 78 | Temp 97.6°F | Ht 64.0 in | Wt 324.0 lb

## 2017-02-16 DIAGNOSIS — I10 Essential (primary) hypertension: Secondary | ICD-10-CM

## 2017-02-16 DIAGNOSIS — Z0000 Encounter for general adult medical examination without abnormal findings: Secondary | ICD-10-CM

## 2017-02-16 DIAGNOSIS — R7302 Impaired glucose tolerance (oral): Secondary | ICD-10-CM

## 2017-02-16 DIAGNOSIS — Z0001 Encounter for general adult medical examination with abnormal findings: Secondary | ICD-10-CM

## 2017-02-16 DIAGNOSIS — E785 Hyperlipidemia, unspecified: Secondary | ICD-10-CM

## 2017-02-16 LAB — LIPID PANEL
Cholesterol: 275 mg/dL — ABNORMAL HIGH (ref 0–200)
HDL: 58.8 mg/dL (ref >39.00)
LDL Cholesterol: 190 mg/dL — ABNORMAL HIGH (ref 0–99)
NonHDL: 216.24
Total CHOL/HDL Ratio: 5
Triglycerides: 129 mg/dL (ref 0.0–149.0)
VLDL: 25.8 mg/dL (ref 0.0–40.0)

## 2017-02-16 LAB — CBC WITH DIFFERENTIAL/PLATELET
Basophils Absolute: 0.1 10*3/uL (ref 0.0–0.1)
Basophils Relative: 0.8 % (ref 0.0–3.0)
Eosinophils Absolute: 0.2 10*3/uL (ref 0.0–0.7)
Eosinophils Relative: 2 % (ref 0.0–5.0)
HCT: 40.7 % (ref 36.0–46.0)
Hemoglobin: 13.7 g/dL (ref 12.0–15.0)
Lymphocytes Relative: 28.9 % (ref 12.0–46.0)
Lymphs Abs: 2.4 10*3/uL (ref 0.7–4.0)
MCHC: 33.7 g/dL (ref 30.0–36.0)
MCV: 81.5 fl (ref 78.0–100.0)
Monocytes Absolute: 0.8 10*3/uL (ref 0.1–1.0)
Monocytes Relative: 9 % (ref 3.0–12.0)
Neutro Abs: 5 10*3/uL (ref 1.4–7.7)
Neutrophils Relative %: 59.3 % (ref 43.0–77.0)
Platelets: 387 10*3/uL (ref 150.0–400.0)
RBC: 4.99 Mil/uL (ref 3.87–5.11)
RDW: 14.5 % (ref 11.5–15.5)
WBC: 8.5 10*3/uL (ref 4.0–10.5)

## 2017-02-16 LAB — URINALYSIS, ROUTINE W REFLEX MICROSCOPIC
Bilirubin Urine: NEGATIVE
Hgb urine dipstick: NEGATIVE
Ketones, ur: NEGATIVE
Leukocytes, UA: NEGATIVE
Nitrite: NEGATIVE
RBC / HPF: NONE SEEN (ref 0–?)
Specific Gravity, Urine: 1.01 (ref 1.000–1.030)
Total Protein, Urine: NEGATIVE
Urine Glucose: NEGATIVE
Urobilinogen, UA: 0.2 (ref 0.0–1.0)
pH: 6.5 (ref 5.0–8.0)

## 2017-02-16 LAB — HEPATIC FUNCTION PANEL
ALT: 30 U/L (ref 0–35)
AST: 18 U/L (ref 0–37)
Albumin: 4.1 g/dL (ref 3.5–5.2)
Alkaline Phosphatase: 128 U/L — ABNORMAL HIGH (ref 39–117)
Bilirubin, Direct: 0.1 mg/dL (ref 0.0–0.3)
Total Bilirubin: 0.4 mg/dL (ref 0.2–1.2)
Total Protein: 8.1 g/dL (ref 6.0–8.3)

## 2017-02-16 LAB — BASIC METABOLIC PANEL
BUN: 8 mg/dL (ref 6–23)
CO2: 33 mEq/L — ABNORMAL HIGH (ref 19–32)
Calcium: 9.8 mg/dL (ref 8.4–10.5)
Chloride: 97 mEq/L (ref 96–112)
Creatinine, Ser: 0.65 mg/dL (ref 0.40–1.20)
GFR: 127.13 mL/min (ref >60.00)
Glucose, Bld: 102 mg/dL — ABNORMAL HIGH (ref 70–99)
Potassium: 3.4 mEq/L — ABNORMAL LOW (ref 3.5–5.1)
Sodium: 137 mEq/L (ref 135–145)

## 2017-02-16 LAB — TSH: TSH: 1.19 u[IU]/mL (ref 0.35–4.50)

## 2017-02-16 LAB — HEMOGLOBIN A1C: Hgb A1c MFr Bld: 5.7 % (ref 4.6–6.5)

## 2017-02-16 LAB — HIV ANTIBODY (ROUTINE TESTING W REFLEX): HIV 1&2 Ab, 4th Generation: NONREACTIVE

## 2017-02-16 MED ORDER — NADOLOL-BENDROFLUMETHIAZIDE 80-5 MG PO TABS: 1.0000 | ORAL_TABLET | Freq: Every day | ORAL | 3 refills | Status: DC

## 2017-02-16 MED ORDER — LEVOTHYROXINE SODIUM 25 MCG PO TABS: ORAL_TABLET | ORAL | 3 refills | Status: DC

## 2017-02-16 MED ORDER — ATORVASTATIN CALCIUM 20 MG PO TABS: 20.0000 mg | ORAL_TABLET | Freq: Every day | ORAL | 3 refills | Status: DC

## 2017-02-16 MED ORDER — EPINEPHRINE 0.3 MG/0.3ML IJ SOAJ: INTRAMUSCULAR | 1 refills | Status: DC

## 2017-02-16 MED ORDER — POTASSIUM CHLORIDE ER 10 MEQ PO TBCR: 10.0000 meq | EXTENDED_RELEASE_TABLET | Freq: Every day | ORAL | 3 refills | Status: DC

## 2017-02-16 MED ORDER — LEVOTHYROXINE SODIUM 25 MCG PO TABS: ORAL_TABLET | ORAL | 30 refills | Status: DC

## 2017-02-16 NOTE — Progress Notes (Signed)
Subjective:    Patient ID: Chelsea Thompson, female    DOB: [birth year], 46 y.o.   MRN: [id number]  HPI  Here for wellness and f/u;  Overall doing ok;  Pt denies Chest pain, worsening SOB, DOE, wheezing, orthopnea, PND, worsening LE edema, palpitations, dizziness or syncope.  Pt denies neurological change such as new headache, facial or extremity weakness.  Pt denies polydipsia, polyuria, or low sugar symptoms. Pt states overall good compliance with treatment and medications, good tolerability, and has been trying to follow appropriate diet.  Pt denies worsening depressive symptoms, suicidal ideation or panic. No fever, night sweats, wt loss, loss of appetite, or other constitutional symptoms.  Pt states good ability with ADL's, has low fall risk, home safety reviewed and adequate, no other significant changes in hearing or vision mexcept for concern about hearing reduced due to wax today, and only occasionally active with exercise. Started 1200 calorie diet x 2wk,  Admits to taking her K pills only m-w-f b/c tyring not take so much meds.  Needs refills.   Also has bilat hearing loss due to wax, needs irrigation, without ear pain Past Medical History:  Diagnosis Date  . Allergic rhinitis   . Anxiety 02/27/2012  . Depression   . GERD (gastroesophageal reflux disease) 04/08/2016  . Glucose intolerance (impaired glucose tolerance)   . HTN (hypertension)   . Hyperlipidemia   . Hypothyroidism    DR [name]  . Impaired glucose tolerance 10/04/2011  . Obesity   . OSA (obstructive sleep apnea) 10/08/2014  . Pituitary adenoma ([place])   . Shortness of breath    Past Surgical History:  Procedure Laterality Date  . ANTERIOR CERVICAL DECOMP/DISCECTOMY FUSION  11/18/2012   Procedure: ANTERIOR CERVICAL DECOMPRESSION/DISCECTOMY FUSION 1 LEVEL;  Surgeon: Hosie Spangle, MD;  Location: [facility];  Service: Neurosurgery;  Laterality: N/A;  Cervical five-six Anterior cervical  decompression/diskectomy/fusion   . BREAST REDUCTION SURGERY    . DILATION AND CURETTAGE OF UTERUS  07/2010   Hysteroscopy NEG w/Dr Mezer    reports that she has never smoked. She has never used smokeless tobacco. She reports that she does not drink alcohol or use drugs. family history includes Cancer in her brother; Hypertension in her brother, mother, and sister; Other (age of onset: 82) in her mother. Allergies  Allergen Reactions  . Other Anaphylaxis    shellfish  . Peanut-Containing Drug Products    Current Outpatient Prescriptions on File Prior to Visit  Medication Sig Dispense Refill  . cabergoline (DOSTINEX) 0.5 MG tablet TAKE 1 TABLET BY MOUTH EVERY MONDAY AND THURSDAY 24 tablet 5  . cholecalciferol (VITAMIN D) 1000 UNITS tablet Take 2,000 Units by mouth daily.    Marland Kitchen EPINEPHrine (EPIPEN 2-PAK) 0.3 mg/0.3 mL IJ SOAJ injection INJECT 0.3 ML IN MUSCLE ONCE AS DIRECTED 2 Device 1  . levofloxacin (LEVAQUIN) 500 MG tablet Take 1 tablet (500 mg total) by mouth daily. 10 tablet 0  . Multiple Vitamin (MULTIVITAMIN WITH MINERALS) TABS Take 1 tablet by mouth daily.    . nadolol-bendroflumethiazide (CORZIDE) 80-5 MG tablet Take 1 tablet by mouth daily. 90 tablet 3  . potassium chloride (K-DUR) 10 MEQ tablet Take 1 tablet (10 mEq total) by mouth daily. 90 tablet 3  . [DISCONTINUED] fexofenadine (ALLEGRA) 180 MG tablet Take 180 mg by mouth daily.       No current facility-administered medications on file prior to visit.    Review of Systems Constitutional: Negative for increased diaphoresis, or other activity,  appetite or siginficant weight change other than noted HENT: Negative for worsening hearing loss, ear pain, facial swelling, mouth sores and neck stiffness.   Eyes: Negative for other worsening pain, redness or visual disturbance.  Respiratory: Negative for choking or stridor Cardiovascular: Negative for other chest pain and palpitations.  Gastrointestinal: Negative for worsening  diarrhea, blood in stool, or abdominal distention Genitourinary: Negative for hematuria, flank pain or change in urine volume.  Musculoskeletal: Negative for myalgias or other joint complaints.  Skin: Negative for other color change and wound or drainage.  Neurological: Negative for syncope and numbness. other than noted Hematological: Negative for adenopathy. or other swelling Psychiatric/Behavioral: Negative for hallucinations, SI, self-injury, decreased concentration or other worsening agitation.  All other system neg per pt    Objective:   Physical Exam BP 126/84   Pulse 78   Temp 97.6 F (36.4 C)   Ht 5\' 4"  (1.626 m)   Wt (!) 324 lb (147 kg)   SpO2 98%   BMI 55.61 kg/m  VS noted,  Constitutional: Pt is oriented to person, place, and time. Appears well-developed and well-nourished, in no significant distress Head: Normocephalic and atraumatic  Eyes: Conjunctivae and EOM are normal. Pupils are equal, round, and reactive to light Right Ear: External ear normal. bilat canals clear after bilat wax impaction irrigation Left Ear: External ear normal Nose: Nose normal.  Mouth/Throat: Oropharynx is clear and moist  Neck: Normal range of motion. Neck supple. No JVD present. No tracheal deviation present or significant neck LA or mass Cardiovascular: Normal rate, regular rhythm, normal heart sounds and intact distal pulses.   Pulmonary/Chest: Effort normal and breath sounds without rales or wheezing  Abdominal: Soft. Bowel sounds are normal. NT. No HSM  Musculoskeletal: Normal range of motion. Exhibits no edema Lymphadenopathy: Has no cervical adenopathy.  Neurological: Pt is alert and oriented to person, place, and time. Pt has normal reflexes. No cranial nerve deficit. Motor grossly intact Skin: Skin is warm and dry. No rash noted or new ulcers Psychiatric:  Has normal mood and affect. Behavior is normal.         Assessment & Plan:

## 2017-02-16 NOTE — Patient Instructions (Addendum)
Your ears were irrigated of the wax today  Please continue all other medications as before, and refills have been done if requested.  Please have the pharmacy call with any other refills you may need.  Please continue your efforts at being more active, low cholesterol diet, and weight control.  You are otherwise up to date with prevention measures today.  Please keep your appointments with your specialists as you may have planned  Please go to the LAB in the Basement (turn left off the elevator) for the tests to be done today  You will be contacted by phone if any changes need to be made immediately.  Otherwise, you will receive a letter about your results with an explanation, but please check with MyChart first.  Please remember to sign up for MyChart if you have not done so, as this will be important to you in the future with finding out test results, communicating by private email, and scheduling acute appointments online when needed.  Please return in 1 year for your yearly visit, or sooner if needed, with Lab testing done 3-5 days before

## 2017-02-17 NOTE — Assessment & Plan Note (Signed)
Mild, for a1c,  to f/u any worsening symptoms or concerns

## 2017-02-17 NOTE — Assessment & Plan Note (Signed)
stable overall by history and exam, recent data reviewed with pt, and pt to continue medical treatment as before,  to f/u any worsening symptoms or concerns BP Readings from Last 3 Encounters:  02/16/17 126/84  04/07/16 136/80  12/28/14 (!) 144/82

## 2017-02-17 NOTE — Assessment & Plan Note (Signed)
Mild elevated ldl, d/w pt - declines statin

## 2017-02-17 NOTE — Assessment & Plan Note (Signed)

## 2017-04-11 ENCOUNTER — Other Ambulatory Visit: Payer: Self-pay | Admitting: Internal Medicine

## 2017-10-15 ENCOUNTER — Telehealth: Payer: Self-pay | Admitting: *Deleted

## 2017-10-15 NOTE — Telephone Encounter (Signed)
Rec'd call from pharmacy stating manufacturer states the Nadolol-Bendroflumethiazide has been discontinued. Requesting an alternative...[name]

## 2017-10-16 MED ORDER — ATENOLOL-CHLORTHALIDONE 100-25 MG PO TABS: 1.0000 | ORAL_TABLET | Freq: Every day | ORAL | 3 refills | Status: DC

## 2017-10-16 NOTE — Telephone Encounter (Signed)
Pt called and is requesting to be put on  40-5 CORZIDE Please advise

## 2017-10-16 NOTE — Telephone Encounter (Signed)
Taking the lower dose corzide would not be good for her BP  Ok to change to tenoretic 100-25 (which works about the same) - done erx  Continue to monitor BP at home or pharmacy, or make Nurse visit for BP check in 1 week  We will need to be sure to see her back at [facility] in Mar 2019 - to let pt know

## 2017-10-16 NOTE — Telephone Encounter (Signed)
Notified pt w/MD response.../lmb 

## 2017-12-25 ENCOUNTER — Ambulatory Visit: Payer: Self-pay | Admitting: Internal Medicine

## 2017-12-26 ENCOUNTER — Ambulatory Visit: Payer: BLUE CROSS/BLUE SHIELD | Admitting: Internal Medicine

## 2017-12-26 ENCOUNTER — Encounter: Payer: Self-pay | Admitting: Internal Medicine

## 2017-12-26 VITALS — BP 126/84 | HR 75 | Temp 98.3°F | Ht 64.0 in | Wt 318.0 lb

## 2017-12-26 DIAGNOSIS — E876 Hypokalemia: Secondary | ICD-10-CM | POA: Diagnosis not present

## 2017-12-26 DIAGNOSIS — E785 Hyperlipidemia, unspecified: Secondary | ICD-10-CM

## 2017-12-26 DIAGNOSIS — R7302 Impaired glucose tolerance (oral): Secondary | ICD-10-CM | POA: Diagnosis not present

## 2017-12-26 DIAGNOSIS — E559 Vitamin D deficiency, unspecified: Secondary | ICD-10-CM | POA: Diagnosis not present

## 2017-12-26 DIAGNOSIS — G4700 Insomnia, unspecified: Secondary | ICD-10-CM

## 2017-12-26 DIAGNOSIS — Z23 Encounter for immunization: Secondary | ICD-10-CM

## 2017-12-26 DIAGNOSIS — H9193 Unspecified hearing loss, bilateral: Secondary | ICD-10-CM | POA: Diagnosis not present

## 2017-12-26 DIAGNOSIS — I10 Essential (primary) hypertension: Secondary | ICD-10-CM | POA: Diagnosis not present

## 2017-12-26 MED ORDER — ZOLPIDEM TARTRATE 10 MG PO TABS: 10.0000 mg | ORAL_TABLET | Freq: Every evening | ORAL | 1 refills | Status: DC | PRN

## 2017-12-26 MED ORDER — EPINEPHRINE 0.3 MG/0.3ML IJ SOAJ: INTRAMUSCULAR | 1 refills | Status: DC

## 2017-12-26 MED ORDER — LEVOTHYROXINE SODIUM 25 MCG PO TABS: ORAL_TABLET | ORAL | 3 refills | Status: DC

## 2017-12-26 MED ORDER — METOPROLOL-HYDROCHLOROTHIAZIDE 50-25 MG PO TABS: 1.0000 | ORAL_TABLET | Freq: Every day | ORAL | 3 refills | Status: DC

## 2017-12-26 MED ORDER — POTASSIUM CHLORIDE ER 10 MEQ PO TBCR: 10.0000 meq | EXTENDED_RELEASE_TABLET | Freq: Every day | ORAL | 3 refills | Status: DC

## 2017-12-26 MED ORDER — ATORVASTATIN CALCIUM 20 MG PO TABS: 20.0000 mg | ORAL_TABLET | Freq: Every day | ORAL | 3 refills | Status: DC

## 2017-12-26 NOTE — Progress Notes (Signed)
Subjective:    Patient ID: Chelsea Thompson, female    DOB: 04-26-1971, 47 y.o.   MRN: [id number]   HPI  Here to f/u; overall doing ok,  Pt denies chest pain, increasing sob or doe, wheezing, orthopnea, PND, increased LE swelling, palpitations, dizziness or syncope.  Pt denies new neurological symptoms such as new headache, or facial or extremity weakness or numbness.  Pt denies polydipsia, polyuria, or low sugar episode.  Pt states overall good compliance with meds, mostly trying to follow appropriate diet, with wt overall stable,  but little exercise however. Wt Readings from Last 3 Encounters:  12/26/17 (!) 318 lb (144.2 kg)  02/16/17 (!) 324 lb (147 kg)  04/07/16 (!) 315 lb (142.9 kg)  Unable to get to sleep since dec 2018 change corzide to the tenoretic and keen to blame it on the med due to side effect profile lists insomnia.  Never started the statin but willing to start, in addition to lower chol diet.  Now taking Vit D 10K per day after recent low Vit D per labs done at [place] per GYN.  Did also have mild low K, and admits to not taking her potassium med.  Also has bilat hearing loss worse in the past week, without pain, fever, or ear d/c. , Past Medical History:  Diagnosis Date  . Allergic rhinitis   . Anxiety 02/27/2012  . Depression   . GERD (gastroesophageal reflux disease) 04/08/2016  . Glucose intolerance (impaired glucose tolerance)   . HTN (hypertension)   . Hyperlipidemia   . Hypothyroidism    DR [name]  . Impaired glucose tolerance 10/04/2011  . Obesity   . OSA (obstructive sleep apnea) 10/08/2014  . Pituitary adenoma ([place])   . Shortness of breath    Past Surgical History:  Procedure Laterality Date  . ANTERIOR CERVICAL DECOMP/DISCECTOMY FUSION  11/18/2012   Procedure: ANTERIOR CERVICAL DECOMPRESSION/DISCECTOMY FUSION 1 LEVEL;  Surgeon: Hosie Spangle, MD;  Location: [place] [facility];  Service: Neurosurgery;  Laterality: N/A;  Cervical five-six Anterior cervical  decompression/diskectomy/fusion   . BREAST REDUCTION SURGERY    . DILATION AND CURETTAGE OF UTERUS  07/2010   Hysteroscopy NEG w/Dr Mezer    reports that  has never smoked. she has never used smokeless tobacco. She reports that she does not drink alcohol or use drugs. family history includes Cancer in her brother; Hypertension in her brother, mother, and sister; Other (age of onset: 38) in her mother. Allergies  Allergen Reactions  . Other Anaphylaxis    shellfish  . Peanut-Containing Drug Products    Current Outpatient Medications on File Prior to Visit  Medication Sig Dispense Refill  . cabergoline (DOSTINEX) 0.5 MG tablet TAKE 1 TABLET BY MOUTH EVERY MONDAY AND THURSDAY 24 tablet 5  . cholecalciferol (VITAMIN D) 1000 UNITS tablet Take 2,000 Units by mouth daily.    . Multiple Vitamin (MULTIVITAMIN WITH MINERALS) TABS Take 1 tablet by mouth daily.    . [DISCONTINUED] fexofenadine (ALLEGRA) 180 MG tablet Take 180 mg by mouth daily.       No current facility-administered medications on file prior to visit.    Review of Systems  Constitutional: Negative for other unusual diaphoresis or sweats HENT: Negative for ear discharge or swelling Eyes: Negative for other worsening visual disturbances Respiratory: Negative for stridor or other swelling  Gastrointestinal: Negative for worsening distension or other blood Genitourinary: Negative for retention or other urinary change Musculoskeletal: Negative for other MSK pain or swelling Skin: Negative  for color change or other new lesions Neurological: Negative for worsening tremors and other numbness  Psychiatric/Behavioral: Negative for worsening agitation or other fatigue All other system neg per pt    Objective:   Physical Exam BP 126/84   Pulse 75   Temp 98.3 F (36.8 C) (Oral)   Ht 5\' 4"  (1.626 m)   Wt (!) 318 lb (144.2 kg)   SpO2 100%   BMI 54.58 kg/m  VS noted, super morbid obese Constitutional: Pt appears in NAD HENT:  Head: NCAT.  Right Ear: External ear normal.  Left Ear: External ear normal.  Bilat canals cleared of wax impactions with irrigation and hearing improved Eyes: . Pupils are equal, round, and reactive to light. Conjunctivae and EOM are normal Nose: without d/c or deformity Neck: Neck supple. Gross normal ROM Cardiovascular: Normal rate and regular rhythm.   Pulmonary/Chest: Effort normal and breath sounds without rales or wheezing.  Neurological: Pt is alert. At baseline orientation, motor grossly intact Skin: Skin is warm. No rashes, other new lesions, no LE edema Psychiatric: Pt behavior is normal without agitation  No other exam findings    Assessment & Plan:

## 2017-12-26 NOTE — Assessment & Plan Note (Addendum)
Ok to change to lopressor HCT, though may need changed to labetolol in event of successful artificial insemination attempt planned April 2019  Note:  Total time for pt hx, exam, review of record with pt in the room, determination of diagnoses and plan for further eval and tx is > 40 min, with over 50% spent in coordination and counseling of patient including the differential dx, tx, further evaluation and other management of HTN, insomnia, HLD, hyperglycemia, bilateral hearing loss, hypokalemia, and vit d deficiency

## 2017-12-26 NOTE — Assessment & Plan Note (Signed)
Lab Results  Component Value Date   HGBA1C 5.7 02/16/2017  stable overall by history and exam, recent data reviewed with pt, and pt to continue medical treatment as before,  to f/u any worsening symptoms or concerns

## 2017-12-26 NOTE — Assessment & Plan Note (Signed)
Urged pt compliance with K med,  to f/u any worsening symptoms or concerns

## 2017-12-26 NOTE — Patient Instructions (Addendum)
You had the flu shot today  Your ears were irrigated of wax today  OK to stop the corzide and tenoretic as you have  Please take all new medication as prescribed - the generic for lopressor HCT  Please take all new medication as prescribed - the ambien 10 mg for sleep as needed  Please continue all other medications as before, and refills have been done if requested., including restarting the potassium, and starting the statin   Please have the pharmacy call with any other refills you may need.  Please continue your efforts at being more active, low cholesterol diet, and weight control.  Please keep your appointments with your specialists as you may have planned

## 2017-12-26 NOTE — Assessment & Plan Note (Signed)
To cont Vit D 10K units per day

## 2017-12-26 NOTE — Assessment & Plan Note (Signed)
Lab Results  Component Value Date   LDLCALC 190 (H) 02/16/2017  severe, to start statin, f/u lipids next labs

## 2017-12-26 NOTE — Assessment & Plan Note (Signed)
Improved with irrigation of bilat ear wax impactions.

## 2017-12-26 NOTE — Assessment & Plan Note (Signed)
Ok for ambien 10 qhs prn,  to f/u any worsening symptoms or concerns 

## 2018-01-04 ENCOUNTER — Ambulatory Visit (INDEPENDENT_AMBULATORY_CARE_PROVIDER_SITE_OTHER): Payer: BLUE CROSS/BLUE SHIELD | Admitting: Internal Medicine

## 2018-01-04 VITALS — BP 128/86 | HR 79 | Temp 97.7°F | Ht 64.0 in | Wt 314.0 lb

## 2018-01-04 DIAGNOSIS — E559 Vitamin D deficiency, unspecified: Secondary | ICD-10-CM | POA: Diagnosis not present

## 2018-01-04 DIAGNOSIS — J069 Acute upper respiratory infection, unspecified: Secondary | ICD-10-CM | POA: Diagnosis not present

## 2018-01-04 DIAGNOSIS — I10 Essential (primary) hypertension: Secondary | ICD-10-CM

## 2018-01-04 DIAGNOSIS — Z0001 Encounter for general adult medical examination with abnormal findings: Secondary | ICD-10-CM

## 2018-01-04 DIAGNOSIS — M545 Low back pain, unspecified: Secondary | ICD-10-CM

## 2018-01-04 DIAGNOSIS — M25512 Pain in left shoulder: Secondary | ICD-10-CM

## 2018-01-04 DIAGNOSIS — E669 Obesity, unspecified: Secondary | ICD-10-CM

## 2018-01-04 DIAGNOSIS — E785 Hyperlipidemia, unspecified: Secondary | ICD-10-CM | POA: Diagnosis not present

## 2018-01-04 DIAGNOSIS — R7302 Impaired glucose tolerance (oral): Secondary | ICD-10-CM | POA: Diagnosis not present

## 2018-01-04 MED ORDER — AZITHROMYCIN 250 MG PO TABS: ORAL_TABLET | ORAL | 1 refills | Status: DC

## 2018-01-04 MED ORDER — PHENTERMINE HCL 37.5 MG PO CAPS: 37.5000 mg | ORAL_CAPSULE | ORAL | 2 refills | Status: DC

## 2018-01-04 MED ORDER — CYCLOBENZAPRINE HCL 5 MG PO TABS: 5.0000 mg | ORAL_TABLET | Freq: Three times a day (TID) | ORAL | 1 refills | Status: DC | PRN

## 2018-01-04 NOTE — Patient Instructions (Addendum)
Please take all new medication as prescribed - the muscle relaxer, phentermine and antibiotic  Please continue all other medications as before, and refills have been done if requested.  Please have the pharmacy call with any other refills you may need.  Please continue your efforts at being more active, low cholesterol diet, and weight control.  You are otherwise up to date with prevention measures today.  You will be contacted regarding the referral for: Physical Therapy, and Sports medicine  Please keep your appointments with your specialists as you may have planned  Please go to the LAB in the Basement (turn left off the elevator) for the tests to be done IN ONE WEEK  You will be contacted by phone if any changes need to be made immediately.  Otherwise, you will receive a letter about your results with an explanation, but please check with MyChart first.  Please remember to sign up for MyChart if you have not done so, as this will be important to you in the future with finding out test results, communicating by private email, and scheduling acute appointments online when needed.  Please return in 6 months, or sooner if needed, with Lab testing done 3-5 days before

## 2018-01-04 NOTE — Progress Notes (Signed)
Subjective:    Patient ID: Chelsea Thompson, female    DOB: 1971/09/21, 47 y.o.   MRN: [id number]  HPI    Here for wellness and f/u;  Overall doing ok;  Pt denies Chest pain, worsening SOB, DOE, wheezing, orthopnea, PND, worsening LE edema, palpitations, dizziness or syncope.  Pt denies neurological change such as new headache, facial or extremity weakness.  Pt denies polydipsia, polyuria, or low sugar symptoms. Pt states overall good compliance with treatment and medications, good tolerability, and has been trying to follow appropriate diet.  Pt denies worsening depressive symptoms, suicidal ideation or panic. No fever, night sweats, wt loss, loss of appetite, or other constitutional symptoms.  Pt states good ability with ADL's, has low fall risk, home safety reviewed and adequate, no other significant changes in hearing or vision, and not active with exercise.  Next appt GYN Dr Garwin Brothers in Mar 2019. , with mammogram.  Has been taking the statin for 1 wk, tolerating ok Also incidentally,  Here with 2-3 days acute onset fever, facial pain, pressure, headache, general weakness and malaise, and greenish d/c, with mild ST and cough.  Also with left ear popping and crackling and mild left ear discomfort without hearing loss or vertigo Also, Pt continues to have recurring left shoulder and LBP without change in severity, bowel or bladder change, fever, wt loss,  worsening LE pain/numbness/weakness, gait change or falls.  Is extremely interested in phentermine for wt loss, as this did help at least temporarily a few yrs ago with significant wt loss.  No other interval hx or new complaints Past Medical History:  Diagnosis Date  . Allergic rhinitis   . Anxiety 02/27/2012  . Depression   . GERD (gastroesophageal reflux disease) 04/08/2016  . Glucose intolerance (impaired glucose tolerance)   . HTN (hypertension)   . Hyperlipidemia   . Hypothyroidism    DR [name]  . Impaired glucose tolerance 10/04/2011  .  Obesity   . OSA (obstructive sleep apnea) 10/08/2014  . Pituitary adenoma ([place])   . Shortness of breath    Past Surgical History:  Procedure Laterality Date  . ANTERIOR CERVICAL DECOMP/DISCECTOMY FUSION  11/18/2012   Procedure: ANTERIOR CERVICAL DECOMPRESSION/DISCECTOMY FUSION 1 LEVEL;  Surgeon: Hosie Spangle, MD;  Location: [facility];  Service: Neurosurgery;  Laterality: N/A;  Cervical five-six Anterior cervical decompression/diskectomy/fusion   . BREAST REDUCTION SURGERY    . DILATION AND CURETTAGE OF UTERUS  07/2010   Hysteroscopy NEG w/Dr Mezer    reports that  has never smoked. she has never used smokeless tobacco. She reports that she does not drink alcohol or use drugs. family history includes Cancer in her brother; Hypertension in her brother, mother, and sister; Other (age of onset: 75) in her mother. Allergies  Allergen Reactions  . Other Anaphylaxis    shellfish  . Peanut-Containing Drug Products    Current Outpatient Medications on File Prior to Visit  Medication Sig Dispense Refill  . atorvastatin (LIPITOR) 20 MG tablet Take 1 tablet (20 mg total) by mouth daily. 90 tablet 3  . cabergoline (DOSTINEX) 0.5 MG tablet TAKE 1 TABLET BY MOUTH EVERY MONDAY AND THURSDAY 24 tablet 5  . cholecalciferol (VITAMIN D) 1000 UNITS tablet Take 2,000 Units by mouth daily.    Marland Kitchen EPINEPHrine (EPIPEN 2-PAK) 0.3 mg/0.3 mL IJ SOAJ injection INJECT 0.3 ML IN MUSCLE ONCE AS DIRECTED 2 Device 1  . levothyroxine (SYNTHROID, LEVOTHROID) 25 MCG tablet TAKE 1 TABLET (25 MCG TOTAL) BY MOUTH  DAILY BEFORE BREAKFAST. 90 tablet 3  . metoprolol-hydrochlorothiazide (LOPRESSOR HCT) 50-25 MG tablet Take 1 tablet by mouth daily. 90 tablet 3  . Multiple Vitamin (MULTIVITAMIN WITH MINERALS) TABS Take 1 tablet by mouth daily.    . potassium chloride (K-DUR) 10 MEQ tablet Take 1 tablet (10 mEq total) by mouth daily. 90 tablet 3  . zolpidem (AMBIEN) 10 MG tablet Take 1 tablet (10 mg total) by mouth at bedtime as  needed for sleep. 90 tablet 1  . [DISCONTINUED] fexofenadine (ALLEGRA) 180 MG tablet Take 180 mg by mouth daily.       No current facility-administered medications on file prior to visit.    Review of Systems Constitutional: Negative for other unusual diaphoresis, sweats, appetite or weight changes HENT: Negative for other worsening hearing loss, ear pain, facial swelling, mouth sores or neck stiffness.   Eyes: Negative for other worsening pain, redness or other visual disturbance.  Respiratory: Negative for other stridor or swelling Cardiovascular: Negative for other palpitations or other chest pain  Gastrointestinal: Negative for worsening diarrhea or loose stools, blood in stool, distention or other pain Genitourinary: Negative for hematuria, flank pain or other change in urine volume.  Musculoskeletal: Negative for myalgias or other joint swelling.  Skin: Negative for other color change, or other wound or worsening drainage.  Neurological: Negative for other syncope or numbness. Hematological: Negative for other adenopathy or swelling Psychiatric/Behavioral: Negative for hallucinations, other worsening agitation, SI, self-injury, or new decreased concentration \\All  other system neg per pt    Objective:   Physical Exam BP 128/86   Pulse 79   Temp 97.7 F (36.5 C) (Oral)   Ht 5\' 4"  (1.626 m)   Wt (!) 314 lb (142.4 kg)   SpO2 96%   BMI 53.90 kg/m  VS noted, super morbid obese Constitutional: Pt is oriented to person, place, and time. Appears well-developed and well-nourished, in no significant distress and comfortable Head: Normocephalic and atraumatic  Eyes: Conjunctivae and EOM are normal. Pupils are equal, round, and reactive to light Bilat tm's with mild erythema.  Max sinus areas mild tender.  Pharynx with mild erythema, no exudate   Right Ear: External ear normal without discharge Left Ear: External ear normal without discharge Nose: Nose without discharge or  deformity Mouth/Throat: Oropharynx is without other ulcerations and moist  Neck: Normal range of motion. Neck supple. No JVD present. No tracheal deviation present or significant neck LA or mass Cardiovascular: Normal rate, regular rhythm, normal heart sounds and intact distal pulses.   Pulmonary/Chest: WOB normal and breath sounds without rales or wheezing  Abdominal: Soft. Bowel sounds are normal. NT. No HSM  Musculoskeletal: Normal range of motion. Exhibits no edema; has diffuse lumbar tender and left lumbar paravertebral tender spasm Lymphadenopathy: Has no other cervical adenopathy.  Neurological: Pt is alert and oriented to person, place, and time. Pt has normal reflexes. No cranial nerve deficit. Motor grossly intact, Gait intact Skin: Skin is warm and dry. No rash noted or new ulcerations Psychiatric:  Has normal mood and affect. Behavior is normal without agitation No other exam findings    Assessment & Plan:

## 2018-01-06 ENCOUNTER — Encounter: Payer: Self-pay | Admitting: Internal Medicine

## 2018-01-06 NOTE — Assessment & Plan Note (Signed)
[place] for phetermine limited rx asd,  to f/u any worsening symptoms or concerns

## 2018-01-06 NOTE — Assessment & Plan Note (Signed)
BP Readings from Last 3 Encounters:  01/04/18 128/86  12/26/17 126/84  02/16/17 126/84  stable overall by history and exam, recent data reviewed with pt, and pt to continue medical treatment as before,  to f/u any worsening symptoms or concerns

## 2018-01-06 NOTE — Assessment & Plan Note (Signed)
Also for vit d f/u lab

## 2018-01-06 NOTE — Assessment & Plan Note (Signed)
Acute on chronic, for wt loss, muscle relaxer prn, and refer sports med

## 2018-01-06 NOTE — Assessment & Plan Note (Signed)
stable overall by history and exam, recent data reviewed with pt, and pt to continue medical treatment as before,  to f/u any worsening symptoms or concerns Lab Results  Component Value Date   HGBA1C 5.7 02/16/2017

## 2018-01-06 NOTE — Assessment & Plan Note (Signed)

## 2018-01-06 NOTE — Assessment & Plan Note (Signed)
?   Etiology. For sport med referral

## 2018-01-06 NOTE — Assessment & Plan Note (Addendum)
Mild to mod, for antibx course,  to f/u any worsening symptoms or concerns  In addition to the time spent performing CPE, I spent an additional 25 minutes face to face,in which greater than 50% of this time was spent in counseling and coordination of care for patient's acute illness as documented, including the differential dx, treatment, further evaluation and other management of acute upper respiratory infection, hyperglycemia, HLD, HTN, low back pain. Left shoulder pain, and morbid obesity

## 2018-01-06 NOTE — Assessment & Plan Note (Signed)
Lab Results  Component Value Date   LDLCALC 190 (H) 02/16/2017  to cont statin, for f/u lab in 1 more wk

## 2018-01-08 ENCOUNTER — Other Ambulatory Visit: Payer: Self-pay | Admitting: Endocrinology

## 2018-01-08 DIAGNOSIS — E049 Nontoxic goiter, unspecified: Secondary | ICD-10-CM

## 2018-01-16 ENCOUNTER — Encounter: Payer: Self-pay | Admitting: Physical Therapy

## 2018-01-16 ENCOUNTER — Other Ambulatory Visit: Payer: Self-pay

## 2018-01-16 ENCOUNTER — Ambulatory Visit: Payer: BLUE CROSS/BLUE SHIELD | Attending: Internal Medicine | Admitting: Physical Therapy

## 2018-01-16 DIAGNOSIS — M6281 Muscle weakness (generalized): Secondary | ICD-10-CM

## 2018-01-16 DIAGNOSIS — R2689 Other abnormalities of gait and mobility: Secondary | ICD-10-CM | POA: Diagnosis present

## 2018-01-16 DIAGNOSIS — M545 Low back pain: Secondary | ICD-10-CM | POA: Insufficient documentation

## 2018-01-16 DIAGNOSIS — G8929 Other chronic pain: Secondary | ICD-10-CM | POA: Insufficient documentation

## 2018-01-16 DIAGNOSIS — M62830 Muscle spasm of back: Secondary | ICD-10-CM | POA: Diagnosis present

## 2018-01-16 NOTE — Therapy (Signed)
[facility] [place], [place], [address] Phone: [phone]   Fax:  [phone]  Physical Therapy Treatment  Patient Details  Name: Chelsea Thompson MRN: [id number] Date of Birth: [birth year] Referring Provider: Biagio Borg, MD   Encounter Date: 01/16/2018  PT End of Session - 01/16/18 1105    Visit Number  1    Number of Visits  13    Date for PT Re-Evaluation  02/27/18    PT Start Time  1016    PT Stop Time  1101    PT Time Calculation (min)  45 min    Activity Tolerance  Patient tolerated treatment well    Behavior During Therapy  [facility] for tasks assessed/performed       Past Medical History:  Diagnosis Date  . Allergic rhinitis   . Anxiety 02/27/2012  . Depression   . GERD (gastroesophageal reflux disease) 04/08/2016  . Glucose intolerance (impaired glucose tolerance)   . HTN (hypertension)   . Hyperlipidemia   . Hypothyroidism    DR Chelsea Thompson  . Impaired glucose tolerance 10/04/2011  . Obesity   . OSA (obstructive sleep apnea) 10/08/2014  . Pituitary adenoma ([place])   . Shortness of breath     Past Surgical History:  Procedure Laterality Date  . ANTERIOR CERVICAL DECOMP/DISCECTOMY FUSION  11/18/2012   Procedure: ANTERIOR CERVICAL DECOMPRESSION/DISCECTOMY FUSION 1 LEVEL;  Surgeon: Hosie Spangle, MD;  Location: [facility];  Service: Neurosurgery;  Laterality: N/A;  Cervical five-six Anterior cervical decompression/diskectomy/fusion   . BREAST REDUCTION SURGERY    . DILATION AND CURETTAGE OF UTERUS  07/2010   Hysteroscopy NEG w/Dr Mezer    There were no vitals filed for this visit.  Subjective Assessment - 01/16/18 1009    Subjective  pt is a 47 y.o F with CC of Low back pain that started in 2006 following a fall onto pavement landing on her in the to back. since onset she reports pain fluctuates depending on activity. pain starts in the back and radiates to the L calf reports as tingling and pain.  She denies any red flags.  pain starts in the upper back and radiates down to the low back. pt reports the MD referral was supposed to be for neck and back but due to referral only stating low back we will assess the neck when an updated referral is submitted .     Pertinent History  hx of cervical fusion    Limitations  Lifting;Walking;Standing;House hold activities    How long can you sit comfortably?  10 min    How long can you stand comfortably?  10 min    How long can you walk comfortably?  10 min    Diagnostic tests  N/A    Patient Stated Goals  to feel better, walk better with reduce stiffness/ pain, return to normal activities    Currently in Pain?  Yes    Pain Score  8  At worst 10/10    Pain Location  Back    Pain Orientation  Right;Left;Mid;Upper    Pain Descriptors / Indicators  -- difficult descrbing    Pain Type  Chronic pain    Pain Radiating Towards  tingling/. pain to the L calf    Pain Onset  More than a month ago    Pain Frequency  Constant    Aggravating Factors   sitting/ standing, laying down, bending forward,     Pain Relieving Factors  heat pad, massages         [facility] PT Assessment - 01/16/18 1008      Assessment   Medical Diagnosis  L low back pain    Referring Provider  Biagio Borg, MD    Onset Date/Surgical Date  -- 2006    Hand Dominance  Right    Next MD Visit  March    Prior Therapy  yes unsure of what it was for      Precautions   Precautions  None      Restrictions   Weight Bearing Restrictions  No      Balance Screen   Has the patient fallen in the past 6 months  No hx of falling and has fear of falling    How many times?  0    Has the patient had a decrease in activity level because of a fear of falling?   Yes    Is the patient reluctant to leave their home because of a fear of falling?   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    Vocation Requirements  prolonged sitting    Leisure  walking, bowling, shopping, going outdoors      Cognition   Overall Cognitive Status  Within Functional Limits for tasks assessed      Observation/Other Assessments   Focus on Therapeutic Outcomes (FOTO)   71% limited  Predicted 45% limited      ROM / Strength   AROM / PROM / Strength  AROM;PROM;Strength      AROM   AROM Assessment Site  Lumbar    Lumbar Flexion  60    Lumbar Extension  10    Lumbar - Right Side Bend  10    Lumbar - Left Side Bend  14      Strength   Strength Assessment Site  Hip;Knee    Right/Left Hip  Right;Left    Right Hip Flexion  4-/5    Right Hip ABduction  4-/5    Right Hip ADduction  3+/5    Left Hip Flexion  4-/5    Left Hip ABduction  4-/5    Left Hip ADduction  3+/5    Right/Left Knee  Right;Left    Right Knee Flexion  4/5    Right Knee Extension  4/5    Left Knee Flexion  4/5    Left Knee Extension  4/5      Flexibility   Soft Tissue Assessment /Muscle Length  yes    Hamstrings  R hamstring32 degrees L hamstring 20      Palpation   Spinal mobility  normal mobility of T10-L5 PAIVM reported decreased pain in low back during assessment.     Palpation comment  TTP along bil thoracolumbar paraspinals with increased soreness in lumbar spine. pain over the sacrum and L PSIS. tightnes in the bil hamstrings with L>R.  Special Tests    Special Tests  Lumbar;Sacrolliac Tests    Lumbar Tests  Slump Test;Prone Knee Bend Test;Straight Leg Raise    Sacroiliac Tests   Sacral Thrust      Slump test   Findings  Negative      Prone Knee Bend Test    Findings  Negative      Straight Leg Raise   Findings  Positive    Comment  ipislateral low back pain noted during test with L>R L>R      Sacral thrust    Findings  Positive    Side  Left      Ambulation/Gait   Ambulation/Gait  Yes    Gait Pattern  Step-through pattern;Decreased stance time - left;Decreased step length - right;Trendelenburg;Antalgic;Trunk flexed                  [facility] Adult PT Treatment/Exercise - 01/16/18 1008      Lumbar Exercises: Stretches   Active Hamstring Stretch  2 reps;30 seconds    Lower Trunk Rotation  -- 2 x 10 holding end range x 2    Quadruped Mid Back Stretch  3 reps;30 seconds performed in sitting: walking hands down legs      Lumbar Exercises: Seated   Other Seated Lumbar Exercises  SLR 2 x 10             PT Education - 01/16/18 1105    Education provided  Yes    Education Details  evaluation findings, POC. goals, HEP  with proper form/ rationale, anatmoy of the low back and SIJ / biomechanics.     Person(s) Educated  Patient    Methods  Explanation;Verbal cues;Handout    Comprehension  Verbalized understanding;Verbal cues required       PT Short Term Goals - 01/16/18 1115      PT SHORT TERM GOAL #1   Title  pt to be I with inital HEp    Time  3    Period  Weeks    Status  New    Target Date  02/06/18      PT SHORT TERM GOAL #2   Title  pt to vebralize/ demo proper posture in various positions as well as liting mechanics to prevent/ reduce low back pain     Time  3    Period  Weeks    Status  New    Target Date  02/06/18      PT SHORT TERM GOAL #3   Title  Reduce paraspinal spasm to reduce pain to </= 5/10 and promote trunk mobility     Time  3    Period  Weeks    Status  New    Target Date  02/06/18        PT Long Term Goals - 01/16/18 1116      PT LONG TERM GOAL #1   Title  improve trunk flexion to >/= 80 degrees, extension and bil side bending to >/=20 degrees with </= 2/10 pain for functional  mobility for ADLs    Time  6    Period  Weeks    Status  New    Target Date  02/27/18      PT LONG TERM GOAL #2   Title  improve bil LE strength to >/= 4/5 in all planes to provide stability and promote proper form with lifting and walking    Time  6    Period  Weeks  Status  New    Target Date  02/27/18      PT LONG TERM GOAL #3   Title  pt to be able to sit/stand and walk >/= 45 min with </= 2/10 pain for work related tasks and personal goal of returning to walking for exercises.     Time  6    Period  Weeks    Status  New    Target Date  02/27/18      PT LONG TERM GOAL #4   Title  Increase FOTO score to </= 45% limited to demo improvement in function    Time  6    Period  Weeks    Status  New    Target Date  02/27/18      PT LONG TERM GOAL #5   Title  pt to be I with all HEP given as of last visit to maintain and promote current level of function     Time  6    Period  Weeks    Status  New    Target Date  02/27/18            Plan - 01/16/18 1106    Clinical Impression Statement  pt presents to OPPT with CC of mid/ low back pain starting back in 2006 secondary to a fall. pt reports hx of multiple falls due to uneven pavement at work but no falls in the last 6 months. significant limitation with trunk mobility secondary to pain and muscle spasm and weakness in bil LE. significant tightness noted in bil hamstrings with L>R. special testing suggest possible l innominate rotation in combination with spasm. She would benefit from physical therapy to improve posture, reduce pain and spasm and maximize her function by addressing the deficits listed.     History and Personal Factors relevant to plan of care:  Pmhx of anxiety/ depression and  Obesity    Clinical Presentation  Evolving    Clinical Presentation due to:  low back pain, abnormal gait, weakness in bil LE    Clinical Decision Making  Moderate    Rehab Potential  Good    PT Frequency  2x / week    PT Duration  6  weeks    PT Treatment/Interventions  ADLs/Self Care Home Management;Electrical Stimulation;Cryotherapy;Iontophoresis 4mg /ml Dexamethasone;Traction;Moist Heat;Ultrasound;Balance training;Functional mobility training;Therapeutic activities;Therapeutic exercise;Manual techniques;Taping;Passive range of motion;Dry needling;Patient/family education    PT Next Visit Plan  review/ update HEP, possible posterior rotation of L: hamstring stretching, hip flexor activation. core strengthening, manual for paraspinals, modalities PRN    PT Home Exercise Plan  lower trunk rotation, hamstring stretching, SLR, seated low back stretch    Consulted and Agree with Plan of Care  Patient       Patient will benefit from skilled therapeutic intervention in order to improve the following deficits and impairments:  Abnormal gait, Obesity, Decreased strength, Impaired flexibility, Postural dysfunction, Improper body mechanics, Decreased range of motion, Decreased balance, Decreased activity tolerance  Visit Diagnosis: Chronic bilateral low back pain, with sciatica presence unspecified  Muscle spasm of back  Muscle weakness (generalized)  Other abnormalities of gait and mobility     Problem List Patient Active Problem List   Diagnosis Date Noted  . Acute upper respiratory infection 01/04/2018  . Left shoulder pain 01/04/2018  . Left low back pain 01/04/2018  . Insomnia 12/26/2017  . Hypokalemia 12/26/2017  . Vitamin D deficiency 12/26/2017  . Bilateral hearing loss 12/26/2017  . Cough 04/08/2016  .  GERD (gastroesophageal reflux disease) 04/08/2016  . Food allergy 12/29/2014  . OSA on CPAP 12/09/2014  . Severe obesity (BMI >= 40) ([place]) 12/09/2014  . Nocturia more than twice per night 10/08/2014  . Obesity hypoventilation syndrome ([place]) 10/08/2014  . Angioedema 08/29/2014  . Neck pain 07/23/2013  . Goiter 07/23/2013  . Right shoulder pain 10/06/2012  . Fatigue 10/02/2012  . Wrist pain, right  02/27/2012  . Nausea 02/27/2012  . Anxiety 02/27/2012  . Impaired glucose tolerance 10/04/2011  . Encounter for well adult exam with abnormal findings 10/04/2011  . Hyperlipidemia 02/08/2011  . Depression 02/27/2010  . ALLERGIC RHINITIS 02/27/2010  . HYPOTHYROIDISM 12/28/2008  . Essential hypertension 12/28/2008  . DYSPNEA 09/23/2008  . PITUITARY ADENOMA 09/22/2008  . OBESITY 09/22/2008   Starr [place] PT, DPT, LAT, ATC  01/16/18  11:20 AM      [facility] [address] [place], [place], [address] Phone: [phone]   Fax:  [phone]  Name: Ayana Imhof MRN: [id number] Date of Birth: 1971-03-31

## 2018-01-18 ENCOUNTER — Ambulatory Visit: Payer: BLUE CROSS/BLUE SHIELD | Admitting: Family Medicine

## 2018-01-18 ENCOUNTER — Ambulatory Visit (INDEPENDENT_AMBULATORY_CARE_PROVIDER_SITE_OTHER)
Admission: RE | Admit: 2018-01-18 | Discharge: 2018-01-18 | Disposition: A | Discharge status: Home / Self Care | Payer: BLUE CROSS/BLUE SHIELD | Source: Ambulatory Visit | Attending: Family Medicine | Admitting: Family Medicine

## 2018-01-18 ENCOUNTER — Ambulatory Visit
Admission: RE | Admit: 2018-01-18 | Discharge: 2018-01-18 | Disposition: A | Discharge status: Home / Self Care | Payer: BLUE CROSS/BLUE SHIELD | Source: Ambulatory Visit | Attending: Endocrinology | Admitting: Endocrinology

## 2018-01-18 ENCOUNTER — Encounter: Payer: Self-pay | Admitting: Family Medicine

## 2018-01-18 VITALS — BP 158/112 | HR 82 | Ht 64.0 in | Wt 317.0 lb

## 2018-01-18 DIAGNOSIS — M5412 Radiculopathy, cervical region: Secondary | ICD-10-CM | POA: Diagnosis not present

## 2018-01-18 DIAGNOSIS — E049 Nontoxic goiter, unspecified: Secondary | ICD-10-CM

## 2018-01-18 MED ORDER — AMLODIPINE BESYLATE 5 MG PO TABS: 5.0000 mg | ORAL_TABLET | Freq: Every day | ORAL | 3 refills | Status: DC

## 2018-01-18 MED ORDER — GABAPENTIN 100 MG PO CAPS: 200.0000 mg | ORAL_CAPSULE | Freq: Every day | ORAL | 3 refills | Status: DC

## 2018-01-18 MED ORDER — PREDNISONE 50 MG PO TABS: 50.0000 mg | ORAL_TABLET | Freq: Every day | ORAL | 0 refills | Status: DC

## 2018-01-18 NOTE — Patient Instructions (Addendum)
Good to see you  Alvera Singh is your friend. Exercises 3 times a week.  Prednisone daily for 5 days  Gabapentin 200mg  at night  Exercises 3 times a week.  Keep hands within peripheral vision.  Amlodipine 5 mg daily for blood pressure  See me again in 2 weeks

## 2018-01-18 NOTE — Assessment & Plan Note (Addendum)
Patient is having more of a cervical radiculopathy.  Discussed with patient about icing regimen, home exercises, which activities of doing which wants to avoid.  Patient likely could have possibly adjacent segment disease.  No weakness noted on the side of symptomatology.  X-rays ordered today to further evaluate for excessive changes.  Adjacent segment disease is within the differential.  Follow-up again 1 weeks

## 2018-01-18 NOTE — Progress Notes (Signed)
[facility] [place], [place] [address] Phone: [phone] Subjective:     CC: neck pain  FTD:[id number]  Chelsea Thompson is a 47 y.o. female coming in with complaint of neck pain. Patient has been having pain for years. She had cervical spine fusion in 2013. Her pain increased over the past month. She has tingling down in to the left arm. She has had constant pain for one month. Therapies tried stretching, IBU, heat, ice, massages. Patient has a hard time sleeping due to pain.   Patient states that it is fairly severe.  Patient states it feels very similar to what she had in 2013. Patient did have an MRI showing a right-sided C5-C6 nerve root impingement 09/2012.  MRI was independently visualized by me.     Past Medical History:  Diagnosis Date  . Allergic rhinitis   . Anxiety 02/27/2012  . Depression   . GERD (gastroesophageal reflux disease) 04/08/2016  . Glucose intolerance (impaired glucose tolerance)   . HTN (hypertension)   . Hyperlipidemia   . Hypothyroidism    DR [name]  . Impaired glucose tolerance 10/04/2011  . Obesity   . OSA (obstructive sleep apnea) 10/08/2014  . Pituitary adenoma ([place])   . Shortness of breath    Past Surgical History:  Procedure Laterality Date  . ANTERIOR CERVICAL DECOMP/DISCECTOMY FUSION  11/18/2012   Procedure: ANTERIOR CERVICAL DECOMPRESSION/DISCECTOMY FUSION 1 LEVEL;  Surgeon: Hosie Spangle, MD;  Location: [facility];  Service: Neurosurgery;  Laterality: N/A;  Cervical five-six Anterior cervical decompression/diskectomy/fusion   . BREAST REDUCTION SURGERY    . DILATION AND CURETTAGE OF UTERUS  07/2010   Hysteroscopy NEG w/Dr Mezer   Social History   Socioeconomic History  . Marital status: Married    Spouse name: Torry  . Number of children: 0  . Years of education: Masters0  . Highest education level: None  Social Needs  . Financial resource strain: None  . Food insecurity - worry:  None  . Food insecurity - inability: None  . Transportation needs - medical: None  . Transportation needs - non-medical: None  Occupational History    Employer: [employer]  Tobacco Use  . Smoking status: Never Smoker  . Smokeless tobacco: Never Used  Substance and Sexual Activity  . Alcohol use: No    Alcohol/week: 0.0 oz  . Drug use: No  . Sexual activity: None  Other Topics Concern  . None  Social History Narrative   Patient is married Animator) and lives at home with her husband and her mother.   Patient has a Scientist, water quality.   Patient is right-handed.   Patient drinks one cup of soda daily.   Works as [employer]         Allergies  Allergen Reactions  . Other Anaphylaxis    shellfish  . Peanut-Containing Drug Products    Family History  Problem Relation Age of Onset  . Other Mother 85       MVA   . Hypertension Mother   . Hypertension Sister   . Hypertension Brother   . Cancer Brother        Prostate     Past medical history, social, surgical and family history all reviewed in electronic medical record.  No pertanent information unless stated regarding to the chief complaint.   Review of Systems:Review of systems updated and as accurate as of 01/18/18  No  visual  changes, nausea, vomiting, diarrhea, constipation, dizziness, abdominal pain, skin rash, fevers, chills, night sweats, weight loss, swollen lymph nodes, body aches, joint swelling, chest pain, shortness of breath, mood changes.  Positive muscle aches and headaches  Objective  Blood pressure (!) 158/112, pulse 82, height 5\' 4"  (1.626 m), weight (!) 317 lb (143.8 kg), last menstrual period 01/10/2018, SpO2 98 %. Systems examined below as of 01/18/18   General: No apparent distress alert and oriented x3 mood and affect normal, dressed appropriately.  HEENT: Pupils equal, extraocular movements intact goiter noted respiratory: Patient's speak in full sentences and does  not appear short of breath  Cardiovascular: No lower extremity edema, non tender, no erythema  Skin: Warm dry intact with no signs of infection or rash on extremities or on axial skeleton.  Abdomen: Soft nontender  Neuro: Cranial nerves II through XII are intact, neurovascularly intact in all extremities with 2+ DTRs and 2+ pulses.  Lymph: No lymphadenopathy of posterior or anterior cervical chain or axillae bilaterally.  Gait normal with good balance and coordination.  MSK:  Non tender with full range of motion and good stability and symmetric strength and tone of elbows, wrist, hip, knee and ankles bilaterally.  Mild arthritic changes of multiple joints.  Neck: Inspection loss of lordosis. No palpable stepoffs. Positive Spurling's maneuver.  With left-sided radicular symptoms Loss of range of motion.  Does have decreased significantly in extension by 10-15 degrees.  Minimal side bending bilaterally Grip strength and sensation normal in bilateral hands Patient does have some weakness in the C6 distribution on the right side.  Spurling's maneuver is positive on the left No sensory change to C4 to T1 Negative Hoffman sign bilaterally Reflexes normal     Impression and Recommendations:     This case required medical decision making of moderate complexity.      Note: This dictation was prepared with Dragon dictation along with smaller phrase technology. Any transcriptional errors that result from this process are unintentional.

## 2018-01-24 ENCOUNTER — Ambulatory Visit: Payer: BLUE CROSS/BLUE SHIELD | Admitting: Physical Therapy

## 2018-01-24 ENCOUNTER — Encounter: Payer: Self-pay | Admitting: Physical Therapy

## 2018-01-24 DIAGNOSIS — G8929 Other chronic pain: Secondary | ICD-10-CM

## 2018-01-24 DIAGNOSIS — M6281 Muscle weakness (generalized): Secondary | ICD-10-CM

## 2018-01-24 DIAGNOSIS — R2689 Other abnormalities of gait and mobility: Secondary | ICD-10-CM

## 2018-01-24 DIAGNOSIS — M545 Low back pain: Principal | ICD-10-CM

## 2018-01-24 DIAGNOSIS — M62830 Muscle spasm of back: Secondary | ICD-10-CM

## 2018-01-24 NOTE — Patient Instructions (Signed)

## 2018-01-24 NOTE — Therapy (Signed)
[place], [place], [address] Phone: [phone]   Fax:  [phone]  Physical Therapy Treatment  Patient Details  Name: Chelsea Thompson MRN: [id number] Date of Birth: 18-Mar-1971 Referring Provider: Biagio Borg, MD   Encounter Date: 01/24/2018  PT End of Session - 01/24/18 1006    Visit Number  2    Number of Visits  13    Date for PT Re-Evaluation  02/27/18    PT Start Time  0931    PT Stop Time  1020    PT Time Calculation (min)  49 min    Activity Tolerance  Patient tolerated treatment well    Behavior During Therapy  [facility] for tasks assessed/performed       Past Medical History:  Diagnosis Date  . Allergic rhinitis   . Anxiety 02/27/2012  . Depression   . GERD (gastroesophageal reflux disease) 04/08/2016  . Glucose intolerance (impaired glucose tolerance)   . HTN (hypertension)   . Hyperlipidemia   . Hypothyroidism    DR [name]  . Impaired glucose tolerance 10/04/2011  . Obesity   . OSA (obstructive sleep apnea) 10/08/2014  . Pituitary adenoma ([place])   . Shortness of breath     Past Surgical History:  Procedure Laterality Date  . ANTERIOR CERVICAL DECOMP/DISCECTOMY FUSION  11/18/2012   Procedure: ANTERIOR CERVICAL DECOMPRESSION/DISCECTOMY FUSION 1 LEVEL;  Surgeon: Hosie Spangle, MD;  Location: [facility];  Service: Neurosurgery;  Laterality: N/A;  Cervical five-six Anterior cervical decompression/diskectomy/fusion   . BREAST REDUCTION SURGERY    . DILATION AND CURETTAGE OF UTERUS  07/2010   Hysteroscopy NEG w/Dr Mezer    There were no vitals filed for this visit.  Subjective Assessment - 01/24/18 0936    Subjective  back feels worse today; "it's a 9."    Pertinent History  hx of cervical fusion    Patient Stated Goals  to feel better, walk better with reduce stiffness/ pain, return to normal activities    Currently in Pain?  Yes    Pain Score  9     Pain Location  Back     Pain Orientation  Left;Mid;Lower;Upper "always the left"    Pain Type  Chronic pain    Pain Radiating Towards  tingling/pain into Lt calf    Pain Onset  More than a month ago    Pain Frequency  Constant    Aggravating Factors   sitting/standing, lying down, forward bending    Pain Relieving Factors  heating pad, massages                      [facility] Adult PT Treatment/Exercise - 01/24/18 0938      Exercises   Exercises  Lumbar      Lumbar Exercises: Stretches   Active Hamstring Stretch  2 reps;30 seconds    Lower Trunk Rotation  3 reps;20 seconds    Quadruped Mid Back Stretch  3 reps;30 seconds performed in sitting: walking hands down legs      Lumbar Exercises: Aerobic   Recumbent Bike  L2 x 5 min      Lumbar Exercises: Supine   Straight Leg Raise  10 reps      Modalities   Modalities  Moist Heat;Electrical Stimulation      Moist Heat Therapy   Number Minutes Moist Heat  15 Minutes    Moist Heat Location  Lumbar Spine      Electrical  Stimulation   Electrical Stimulation Location  Rt low back (per pt request of pain)    Electrical Stimulation Action  IFC    Electrical Stimulation Parameters  to tolerance x 15 min in prone    Electrical Stimulation Goals  Pain             PT Education - 01/24/18 1006    Education provided  Yes    Education Details  TENS unit    Person(s) Educated  Patient    Methods  Explanation;Handout    Comprehension  Verbalized understanding       PT Short Term Goals - 01/16/18 1115      PT SHORT TERM GOAL #1   Title  pt to be I with inital HEp    Time  3    Period  Weeks    Status  New    Target Date  02/06/18      PT SHORT TERM GOAL #2   Title  pt to vebralize/ demo proper posture in various positions as well as liting mechanics to prevent/ reduce low back pain     Time  3    Period  Weeks    Status  New    Target Date  02/06/18      PT SHORT TERM GOAL #3   Title  Reduce paraspinal spasm to reduce pain to </=  5/10 and promote trunk mobility     Time  3    Period  Weeks    Status  New    Target Date  02/06/18        PT Long Term Goals - 01/16/18 1116      PT LONG TERM GOAL #1   Title  improve trunk flexion to >/= 80 degrees, extension and bil side bending to >/=20 degrees with </= 2/10 pain for functional mobility for ADLs    Time  6    Period  Weeks    Status  New    Target Date  02/27/18      PT LONG TERM GOAL #2   Title  improve bil LE strength to >/= 4/5 in all planes to provide stability and promote proper form with lifting and walking    Time  6    Period  Weeks    Status  New    Target Date  02/27/18      PT LONG TERM GOAL #3   Title  pt to be able to sit/stand and walk >/= 45 min with </= 2/10 pain for work related tasks and personal goal of returning to walking for exercises.     Time  6    Period  Weeks    Status  New    Target Date  02/27/18      PT LONG TERM GOAL #4   Title  Increase FOTO score to </= 45% limited to demo improvement in function    Time  6    Period  Weeks    Status  New    Target Date  02/27/18      PT LONG TERM GOAL #5   Title  pt to be I with all HEP given as of last visit to maintain and promote current level of function     Time  6    Period  Weeks    Status  New    Target Date  02/27/18            Plan - 01/24/18  1006    Clinical Impression Statement  Pt tolerated session well reporting improved pain with modalities.  Pt inconsistent with side of pain, stating initially that pain "always on Lt" but pointed to Rt side for estim.  Will benefit from PT to maximize function.    PT Treatment/Interventions  ADLs/Self Care Home Management;Electrical Stimulation;Cryotherapy;Iontophoresis 4mg /ml Dexamethasone;Traction;Moist Heat;Ultrasound;Balance training;Functional mobility training;Therapeutic activities;Therapeutic exercise;Manual techniques;Taping;Passive range of motion;Dry needling;Patient/family education    PT Next Visit Plan   possible posterior rotation of L: hamstring stretching, hip flexor activation. core strengthening, manual for paraspinals, modalities PRN    Consulted and Agree with Plan of Care  Patient       Patient will benefit from skilled therapeutic intervention in order to improve the following deficits and impairments:  Abnormal gait, Obesity, Decreased strength, Impaired flexibility, Postural dysfunction, Improper body mechanics, Decreased range of motion, Decreased balance, Decreased activity tolerance  Visit Diagnosis: Chronic bilateral low back pain, with sciatica presence unspecified  Muscle spasm of back  Muscle weakness (generalized)  Other abnormalities of gait and mobility     Problem List Patient Active Problem List   Diagnosis Date Noted  . Cervical radiculopathy 01/18/2018  . Acute upper respiratory infection 01/04/2018  . Left shoulder pain 01/04/2018  . Left low back pain 01/04/2018  . Insomnia 12/26/2017  . Hypokalemia 12/26/2017  . Vitamin D deficiency 12/26/2017  . Bilateral hearing loss 12/26/2017  . Cough 04/08/2016  . GERD (gastroesophageal reflux disease) 04/08/2016  . Food allergy 12/29/2014  . OSA on CPAP 12/09/2014  . Severe obesity (BMI >= 40) ([place]) 12/09/2014  . Nocturia more than twice per night 10/08/2014  . Obesity hypoventilation syndrome ([place]) 10/08/2014  . Angioedema 08/29/2014  . Neck pain 07/23/2013  . Goiter 07/23/2013  . Right shoulder pain 10/06/2012  . Fatigue 10/02/2012  . Wrist pain, right 02/27/2012  . Nausea 02/27/2012  . Anxiety 02/27/2012  . Impaired glucose tolerance 10/04/2011  . Encounter for well adult exam with abnormal findings 10/04/2011  . Hyperlipidemia 02/08/2011  . Depression 02/27/2010  . ALLERGIC RHINITIS 02/27/2010  . HYPOTHYROIDISM 12/28/2008  . Essential hypertension 12/28/2008  . DYSPNEA 09/23/2008  . PITUITARY ADENOMA 09/22/2008  . OBESITY 09/22/2008      Laureen Abrahams, PT, DPT 01/24/18 10:09  AM    [facility] [address] [place], [place], [address] Phone: [phone]   Fax:  [phone]  Name: Chelsea Thompson MRN: [id number] Date of Birth: [birth year]

## 2018-02-03 NOTE — Progress Notes (Signed)
[facility] [place], [place] [address] Phone: [phone] Subjective:      CC: Neck and back pain  BJY:[id number]  Chelsea Thompson is a 47 y.o. female coming in with complaint of neck and back pain.  Patient has had cervical spine fusion in 2013.  Was likely having moderate adjacent segment disease.  New x-rays were taken of February 2019 showed some adjacent segment disease at C4-C5.  Patient was sent to formal physical therapy.  Patient states that she is in pain in both neck and back. She notes that her heart rate has been high with activities of daily living.  Patient states more concern over her blood pressure.      Past Medical History:  Diagnosis Date  . Allergic rhinitis   . Anxiety 02/27/2012  . Depression   . GERD (gastroesophageal reflux disease) 04/08/2016  . Glucose intolerance (impaired glucose tolerance)   . HTN (hypertension)   . Hyperlipidemia   . Hypothyroidism    DR [name]  . Impaired glucose tolerance 10/04/2011  . Obesity   . OSA (obstructive sleep apnea) 10/08/2014  . Pituitary adenoma ([place])   . Shortness of breath    Past Surgical History:  Procedure Laterality Date  . ANTERIOR CERVICAL DECOMP/DISCECTOMY FUSION  11/18/2012   Procedure: ANTERIOR CERVICAL DECOMPRESSION/DISCECTOMY FUSION 1 LEVEL;  Surgeon: Hosie Spangle, MD;  Location: [facility];  Service: Neurosurgery;  Laterality: N/A;  Cervical five-six Anterior cervical decompression/diskectomy/fusion   . BREAST REDUCTION SURGERY    . DILATION AND CURETTAGE OF UTERUS  07/2010   Hysteroscopy NEG w/Dr Mezer   Social History   Socioeconomic History  . Marital status: Married    Spouse name: Torry  . Number of children: 0  . Years of education: Masters0  . Highest education level: Not on file  Social Needs  . Financial resource strain: Not on file  . Food insecurity - worry: Not on file  . Food insecurity - inability: Not on file  . Transportation  needs - medical: Not on file  . Transportation needs - non-medical: Not on file  Occupational History    Employer: [employer]  Tobacco Use  . Smoking status: Never Smoker  . Smokeless tobacco: Never Used  Substance and Sexual Activity  . Alcohol use: No    Alcohol/week: 0.0 oz  . Drug use: No  . Sexual activity: Not on file  Other Topics Concern  . Not on file  Social History Narrative   Patient is married Animator) and lives at home with her husband and her mother.   Patient has a Scientist, water quality.   Patient is right-handed.   Patient drinks one cup of soda daily.   Works as [employer]         Allergies  Allergen Reactions  . Other Anaphylaxis    shellfish  . Peanut-Containing Drug Products    Family History  Problem Relation Age of Onset  . Other Mother 63       MVA   . Hypertension Mother   . Hypertension Sister   . Hypertension Brother   . Cancer Brother        Prostate     Past medical history, social, surgical and family history all reviewed in electronic medical record.  No pertanent information unless stated regarding to the chief complaint.   Review of Systems:Review of systems updated and as accurate as of 02/03/18  No  headache, visual changes, nausea, vomiting, diarrhea, constipation, dizziness, abdominal pain, skin rash, fevers, chills, night sweats, weight loss, swollen lymph nodes, body aches, joint swelling, muscle aches, chest pain, shortness of breath, mood changes.   Objective  Last menstrual period 01/10/2018. Systems examined below as of 02/03/18   General: No apparent distress alert and oriented x3 mood and affect normal, dressed appropriately.  HEENT: Pupils equal, extraocular movements intact  Respiratory: Patient's speak in full sentences and does not appear short of breath  Cardiovascular: No lower extremity edema, non tender, no erythema  Skin: Warm dry intact with no signs of infection or rash on  extremities or on axial skeleton.  Abdomen: Soft nontender  Neuro: Cranial nerves II through XII are intact, neurovascularly intact in all extremities with  2+ pulses.  Lymph: No lymphadenopathy of posterior or anterior cervical chain or axillae bilaterally.  Gait normal with good balance and coordination.  MSK:  Non tender with full range of motion and good stability and symmetric strength and tone of shoulders, elbows, wrist, hip, knee and ankles bilaterally.  Neck: Inspection unremarkable. No palpable stepoffs. Positive Spurling's maneuver. Mild loss of range of motion in all planes by 5-10 degrees Mild weakness in the C6 distribution.  Only on the right side.  3+ out of 5 strength compared to 5 out of 5 on the contralateral side. No sensory change to C4 to T1 Negative Hoffman sign bilaterally Reflexes 1+ the tricep compared to contralateral side Tightness of the right trapezius      Impression and Recommendations:     This case required medical decision making of moderate complexity.      Note: This dictation was prepared with Dragon dictation along with smaller phrase technology. Any transcriptional errors that result from this process are unintentional.

## 2018-02-04 ENCOUNTER — Telehealth: Payer: Self-pay

## 2018-02-04 ENCOUNTER — Ambulatory Visit: Payer: BLUE CROSS/BLUE SHIELD | Admitting: Family Medicine

## 2018-02-04 ENCOUNTER — Telehealth: Payer: Self-pay | Admitting: Internal Medicine

## 2018-02-04 ENCOUNTER — Other Ambulatory Visit: Payer: Self-pay | Admitting: Internal Medicine

## 2018-02-04 DIAGNOSIS — M5412 Radiculopathy, cervical region: Secondary | ICD-10-CM

## 2018-02-04 DIAGNOSIS — M542 Cervicalgia: Secondary | ICD-10-CM

## 2018-02-04 MED ORDER — LOSARTAN POTASSIUM-HCTZ 100-12.5 MG PO TABS
1.0000 | ORAL_TABLET | Freq: Every day | ORAL | 3 refills | Status: DC
Start: 2018-02-04 — End: 2018-04-09

## 2018-02-04 MED ORDER — AMLODIPINE BESYLATE 10 MG PO TABS
10.0000 mg | ORAL_TABLET | Freq: Every day | ORAL | 1 refills | Status: DC
Start: 2018-02-04 — End: 2018-02-06

## 2018-02-04 NOTE — Telephone Encounter (Signed)
Ok to add Losartan HCT 100-12.5 mg  I will send rx  Pt should continue All other medication  Please ask pt to return for Nurse Office Visit for BP check in 2 wks please

## 2018-02-04 NOTE — Telephone Encounter (Signed)
Same answer as last; thanks

## 2018-02-04 NOTE — Patient Instructions (Signed)
Good to see you  Change the amlodipine to 10 mg daily  Talk to Dr. Jenny Reichmann  Try the gabapentin again 100-300mg  at night and see how it does.  We will get the CT scan of your neck and see what is going on Keep appointment with neurosurgery to discuss.  Depending on the CT scan we will discuss next step

## 2018-02-04 NOTE — Telephone Encounter (Signed)
BP med is not working.  Pt was seen 2 weeks ago and it was high and has been running about the same, 178/110 (range).  Pt stated she has been having heart palpitations and she has discontinued all; medications.

## 2018-02-04 NOTE — Telephone Encounter (Signed)
Patient came in today---saw dr Jenny Reichmann about 1 month ago ---blood pressure is still running high----please advise what to do to adjust medication now----routing to dr Jenny Reichmann, please advise, I will call patient back----(bp reading today will be documented in office visit patient had with dr Tamala Julian today)

## 2018-02-04 NOTE — Assessment & Plan Note (Signed)
Continued weakness after the prednisone, patient is also having decreasing deep tendon reflexes at this time.  Encourage patient to feel a CT myelogram is necessary to further evaluate patient's fusion as well as the adjacent segment disease noted on the x-ray.  Patient could be a candidate may be for potential epidurals.  Patient would like to avoid the course of surgical intervention if possible.  Weakness continues we need to be more concerned.  We discussed again about the gabapentin which patient has declined to take on a regular basis.  Further evaluation at next follow-up.

## 2018-02-04 NOTE — Telephone Encounter (Signed)
Advised patient of dr Gwynn Burly note---patient wanted to make sure dr Jenny Reichmann understood that she did stop taking metoprolol, it was causing palipitations with her heart---and after seeing dr Tamala Julian today, he has increased amlodipine to 10mg  and then patient will start losartan dr Jenny Reichmann has sent in----are you ok with these meds? Routing back to dr Jenny Reichmann, please advise, I will call patient back and also have her schedule for 2 week follow up (nurse visit) to check bp---thanks

## 2018-02-05 NOTE — Telephone Encounter (Signed)
Left message advising patient of dr Gwynn Burly note/instructions, advised that patient call back to schedule nurse visit to have bp rechecked in 2 weeks---can talk with tamara,RN at [facility] office if any further questions

## 2018-02-06 ENCOUNTER — Ambulatory Visit: Payer: BLUE CROSS/BLUE SHIELD | Admitting: Internal Medicine

## 2018-02-06 ENCOUNTER — Encounter: Payer: Self-pay | Admitting: Internal Medicine

## 2018-02-06 VITALS — BP 136/88 | HR 106 | Temp 98.1°F | Ht 64.0 in | Wt 314.0 lb

## 2018-02-06 DIAGNOSIS — F419 Anxiety disorder, unspecified: Secondary | ICD-10-CM | POA: Diagnosis not present

## 2018-02-06 DIAGNOSIS — I10 Essential (primary) hypertension: Secondary | ICD-10-CM

## 2018-02-06 DIAGNOSIS — R7302 Impaired glucose tolerance (oral): Secondary | ICD-10-CM

## 2018-02-06 MED ORDER — AMLODIPINE BESYLATE 5 MG PO TABS: 5.0000 mg | ORAL_TABLET | Freq: Every day | ORAL | 3 refills | Status: DC

## 2018-02-06 NOTE — Progress Notes (Signed)
Subjective:    Patient ID: Chelsea Thompson, female    DOB: [birth year], 47 y.o.   MRN: [id number]  HPI  Here to f/u; overall doing ok,  Pt denies chest pain, increasing sob or doe, wheezing, orthopnea, PND, increased LE swelling, palpitations, dizziness or syncope.  Pt denies new neurological symptoms such as new headache, or facial or extremity weakness or numbness.  Pt denies polydipsia, polyuria, or low sugar episode.  Pt states overall good compliance with meds, mostly trying to follow appropriate diet, with wt overall stable,  but little exercise however.  Bp has been some labile recently Wt down 3 lbs Wt Readings from Last 3 Encounters:  02/06/18 (!) 314 lb (142.4 kg)  02/04/18 (!) 317 lb (143.8 kg)  01/18/18 (!) 317 lb (143.8 kg)   BP Readings from Last 3 Encounters:  02/07/18 140/83  02/07/18 (!) 218/120  02/06/18 136/88  Has been "allergic" to metoprolol with freq palps  Has not taken the 10 mg amlodipine yet.  Not taking the statin but willing to restart, same with her potassium.  No other interval hx or change Past Medical History:  Diagnosis Date  . Allergic rhinitis   . Anxiety 02/27/2012  . Depression   . GERD (gastroesophageal reflux disease) 04/08/2016  . Glucose intolerance (impaired glucose tolerance)   . HTN (hypertension)   . Hyperlipidemia   . Hypothyroidism    DR [name]  . Impaired glucose tolerance 10/04/2011  . Obesity   . OSA (obstructive sleep apnea) 10/08/2014  . Pituitary adenoma ([place])   . Shortness of breath    Past Surgical History:  Procedure Laterality Date  . ANTERIOR CERVICAL DECOMP/DISCECTOMY FUSION  11/18/2012   Procedure: ANTERIOR CERVICAL DECOMPRESSION/DISCECTOMY FUSION 1 LEVEL;  Surgeon: Hosie Spangle, MD;  Location: [facility];  Service: Neurosurgery;  Laterality: N/A;  Cervical five-six Anterior cervical decompression/diskectomy/fusion   . BREAST REDUCTION SURGERY    . DILATION AND CURETTAGE OF UTERUS  07/2010   Hysteroscopy NEG  w/Dr Mezer    reports that  has never smoked. she has never used smokeless tobacco. She reports that she does not drink alcohol or use drugs. family history includes Cancer in her brother; Hypertension in her brother, mother, and sister; Other (age of onset: 54) in her mother. Allergies  Allergen Reactions  . Other Anaphylaxis    shellfish  . Peanut-Containing Drug Products    Current Outpatient Medications on File Prior to Visit  Medication Sig Dispense Refill  . cholecalciferol (VITAMIN D) 1000 UNITS tablet Take 2,000 Units by mouth daily.    Marland Kitchen levothyroxine (SYNTHROID, LEVOTHROID) 25 MCG tablet TAKE 1 TABLET (25 MCG TOTAL) BY MOUTH DAILY BEFORE BREAKFAST. 90 tablet 3  . losartan-hydrochlorothiazide (HYZAAR) 100-12.5 MG tablet Take 1 tablet by mouth daily. 90 tablet 3  . Multiple Vitamin (MULTIVITAMIN WITH MINERALS) TABS Take 1 tablet by mouth daily.    . [DISCONTINUED] fexofenadine (ALLEGRA) 180 MG tablet Take 180 mg by mouth daily.       No current facility-administered medications on file prior to visit.    Review of Systems  Constitutional: Negative for other unusual diaphoresis or sweats HENT: Negative for ear discharge or swelling Eyes: Negative for other worsening visual disturbances Respiratory: Negative for stridor or other swelling  Gastrointestinal: Negative for worsening distension or other blood Genitourinary: Negative for retention or other urinary change Musculoskeletal: Negative for other MSK pain or swelling Skin: Negative for color change or other new lesions Neurological: Negative for worsening tremors  and other numbness  Psychiatric/Behavioral: Negative for worsening agitation or other fatigue All other system neg per pt    Objective:   Physical Exam BP 136/88   Pulse (!) 106   Temp 98.1 F (36.7 C) (Oral)   Ht 5\' 4"  (1.626 m)   Wt (!) 314 lb (142.4 kg)   LMP 01/10/2018   SpO2 98%   BMI 53.90 kg/m  VS noted,  Constitutional: Pt appears in NAD HENT:  Head: NCAT.  Right Ear: External ear normal.  Left Ear: External ear normal.  Eyes: . Pupils are equal, round, and reactive to light. Conjunctivae and EOM are normal Nose: without d/c or deformity Neck: Neck supple. Gross normal ROM Cardiovascular: Normal rate and regular rhythm.   Pulmonary/Chest: Effort normal and breath sounds without rales or wheezing.  Neurological: Pt is alert. At baseline orientation, motor grossly intact Skin: Skin is warm. No rashes, other new lesions, no LE edema Psychiatric: Pt behavior is normal without agitation  No other exam findings     Assessment & Plan:

## 2018-02-06 NOTE — Patient Instructions (Signed)
Ok to continue the losartan HCT and the amlodipine as you are doing  Please restart the potassium supplement, and atorvastatin  Please continue all other medications as before, and refills have been done if requested.  Please have the pharmacy call with any other refills you may need.  Please continue your efforts at being more active, low cholesterol diet, and weight control.  Please keep your appointments with your specialists as you may have planned

## 2018-02-07 ENCOUNTER — Ambulatory Visit: Payer: Self-pay | Admitting: *Deleted

## 2018-02-07 ENCOUNTER — Other Ambulatory Visit: Payer: Self-pay

## 2018-02-07 ENCOUNTER — Ambulatory Visit: Payer: BLUE CROSS/BLUE SHIELD | Admitting: Physical Therapy

## 2018-02-07 ENCOUNTER — Encounter: Payer: Self-pay | Admitting: Physical Therapy

## 2018-02-07 ENCOUNTER — Encounter (HOSPITAL_COMMUNITY): Payer: Self-pay

## 2018-02-07 ENCOUNTER — Emergency Department (HOSPITAL_COMMUNITY): Payer: BLUE CROSS/BLUE SHIELD

## 2018-02-07 ENCOUNTER — Emergency Department (HOSPITAL_COMMUNITY)
Admission: EM | Admit: 2018-02-07 | Discharge: 2018-02-07 | Disposition: A | Discharge status: Home / Self Care | Payer: BLUE CROSS/BLUE SHIELD | Attending: Emergency Medicine | Admitting: Emergency Medicine

## 2018-02-07 VITALS — BP 218/120

## 2018-02-07 DIAGNOSIS — R2689 Other abnormalities of gait and mobility: Secondary | ICD-10-CM

## 2018-02-07 DIAGNOSIS — M545 Low back pain: Principal | ICD-10-CM

## 2018-02-07 DIAGNOSIS — R002 Palpitations: Secondary | ICD-10-CM

## 2018-02-07 DIAGNOSIS — Z79899 Other long term (current) drug therapy: Secondary | ICD-10-CM | POA: Diagnosis not present

## 2018-02-07 DIAGNOSIS — M62830 Muscle spasm of back: Secondary | ICD-10-CM

## 2018-02-07 DIAGNOSIS — G8929 Other chronic pain: Secondary | ICD-10-CM

## 2018-02-07 DIAGNOSIS — I10 Essential (primary) hypertension: Secondary | ICD-10-CM | POA: Diagnosis present

## 2018-02-07 DIAGNOSIS — E039 Hypothyroidism, unspecified: Secondary | ICD-10-CM | POA: Diagnosis not present

## 2018-02-07 DIAGNOSIS — I160 Hypertensive urgency: Secondary | ICD-10-CM

## 2018-02-07 DIAGNOSIS — M6281 Muscle weakness (generalized): Secondary | ICD-10-CM

## 2018-02-07 LAB — BASIC METABOLIC PANEL
Anion gap: 10 (ref 5–15)
BUN: 5 mg/dL — ABNORMAL LOW (ref 6–20)
CO2: 24 mmol/L (ref 22–32)
Calcium: 9.1 mg/dL (ref 8.9–10.3)
Chloride: 103 mmol/L (ref 101–111)
Creatinine, Ser: 0.64 mg/dL (ref 0.44–1.00)
GFR calc Af Amer: >60 mL/min (ref >60)
GFR calc non Af Amer: >60 mL/min (ref >60)
Glucose, Bld: 97 mg/dL (ref 65–99)
Potassium: 3.7 mmol/L (ref 3.5–5.1)
Sodium: 137 mmol/L (ref 135–145)

## 2018-02-07 LAB — I-STAT TROPONIN, ED: Troponin i, poc: 0 ng/mL (ref 0.00–0.08)

## 2018-02-07 LAB — CBC
HCT: 40.8 % (ref 36.0–46.0)
Hemoglobin: 13.3 g/dL (ref 12.0–15.0)
MCH: 26.7 pg (ref 26.0–34.0)
MCHC: 32.6 g/dL (ref 30.0–36.0)
MCV: 81.8 fL (ref 78.0–100.0)
Platelets: 322 10*3/uL (ref 150–400)
RBC: 4.99 MIL/uL (ref 3.87–5.11)
RDW: 14 % (ref 11.5–15.5)
WBC: 6.8 10*3/uL (ref 4.0–10.5)

## 2018-02-07 LAB — TROPONIN I: Troponin I: <0.03 ng/mL (ref <0.03)

## 2018-02-07 LAB — T4, FREE: Free T4: 1.01 ng/dL (ref 0.61–1.12)

## 2018-02-07 LAB — I-STAT BETA HCG BLOOD, ED (MC, WL, AP ONLY): I-stat hCG, quantitative: <5 m[IU]/mL (ref <5)

## 2018-02-07 LAB — TSH: TSH: 1.057 u[IU]/mL (ref 0.350–4.500)

## 2018-02-07 MED ORDER — SODIUM CHLORIDE 0.9 % IV BOLUS (SEPSIS)
1000.0000 mL | Freq: Once | INTRAVENOUS | Status: AC
  Administered 2018-02-07: 1000 mL via INTRAVENOUS

## 2018-02-07 NOTE — ED Notes (Signed)
Pt returned from xray

## 2018-02-07 NOTE — Telephone Encounter (Signed)
Pt  Getting  Physical  Therapy   And  BP  Is  Extremely  Elevated  -  She  Feels   Anxious   BP  IS   218/120   Pt   Drove  Herself  To  PT . Pt and  Physical  Therapist  Advised   Pt  Gp  To ER  ASAP   Flow  Coordinator  Notified     Reason for Disposition . [8] Systolic BP  >= 889 OR Diastolic >= 169  AND [4] having NO cardiac or neurologic symptoms  Answer Assessment - Initial Assessment Questions 1. BLOOD PRESSURE: "What is the blood pressure?" "Did you take at least two measurements 5 minutes apart?"    200/120       218/120   2. ONSET: "When did you take your blood pressure?"      1050  Am   3. HOW: "How did you obtain the blood pressure?" (e.g., visiting nurse, automatic home BP monitor)     Physical  Therapy   4. HISTORY: "Do you have a history of high blood pressure?"      yes 5. MEDICATIONS: "Are you taking any medications for blood pressure?" "Have you missed any doses recently?"       Taking  meds    Has  Not  missed  A  Dose   6. OTHER SYMPTOMS: "Do you have any symptoms?" (e.g., headache, chest pain, blurred vision, difficulty breathing, weakness)     Headace  Nervous    7. PREGNANCY: "Is there any chance you are pregnant?" "When was your last menstrual period?"      27  Jan  Protocols used: HIGH BLOOD PRESSURE-A-AH

## 2018-02-07 NOTE — ED Provider Notes (Signed)
[facility] EMERGENCY DEPARTMENT Provider Note   CSN: [id number] Arrival date & time: 02/07/18  1120     History   Chief Complaint Chief Complaint  Patient presents with  . Hypertension  . Tachycardia    HPI Chelsea Thompson is a 47 y.o. female.  HPI Patient presents with concern of palpitations, chest pain, and intractable hypertension. Patient has multiple medical issues, most notably hypertension, which she is managing with her primary care physician.  She notes that she is tried multiple medication, including initiation of ARB yesterday. Around 1 month ago the patient began beta-blocker therapy. She notes that she was having palpitations prior to that, but seemingly were more severe after initiating that medication, and there is associated generalized discomfort. However, no pain until today, and she describes chest tightness, which has improved substantially, since being severe at rehabilitation.  When she is at rehabilitation for ongoing chronic back and neck pain. Currently patient denies any substantial pain, any ongoing palpitations, but does feel rapid heart rate. No lightheadedness, no confusion, disorientation, nausea.  Past Medical History:  Diagnosis Date  . Allergic rhinitis   . Anxiety 02/27/2012  . Depression   . GERD (gastroesophageal reflux disease) 04/08/2016  . Glucose intolerance (impaired glucose tolerance)   . HTN (hypertension)   . Hyperlipidemia   . Hypothyroidism    DR [name]  . Impaired glucose tolerance 10/04/2011  . Obesity   . OSA (obstructive sleep apnea) 10/08/2014  . Pituitary adenoma ([place])   . Shortness of breath     Patient Active Problem List   Diagnosis Date Noted  . Cervical radiculopathy 01/18/2018  . Acute upper respiratory infection 01/04/2018  . Left shoulder pain 01/04/2018  . Left low back pain 01/04/2018  . Insomnia 12/26/2017  . Hypokalemia 12/26/2017  . Vitamin D deficiency 12/26/2017  . Bilateral  hearing loss 12/26/2017  . Cough 04/08/2016  . GERD (gastroesophageal reflux disease) 04/08/2016  . Food allergy 12/29/2014  . OSA on CPAP 12/09/2014  . Severe obesity (BMI >= 40) ([place]) 12/09/2014  . Nocturia more than twice per night 10/08/2014  . Obesity hypoventilation syndrome ([place]) 10/08/2014  . Angioedema 08/29/2014  . Neck pain 07/23/2013  . Goiter 07/23/2013  . Right shoulder pain 10/06/2012  . Fatigue 10/02/2012  . Wrist pain, right 02/27/2012  . Nausea 02/27/2012  . Anxiety 02/27/2012  . Impaired glucose tolerance 10/04/2011  . Encounter for well adult exam with abnormal findings 10/04/2011  . Hyperlipidemia 02/08/2011  . Depression 02/27/2010  . ALLERGIC RHINITIS 02/27/2010  . HYPOTHYROIDISM 12/28/2008  . Essential hypertension 12/28/2008  . DYSPNEA 09/23/2008  . PITUITARY ADENOMA 09/22/2008  . OBESITY 09/22/2008    Past Surgical History:  Procedure Laterality Date  . ANTERIOR CERVICAL DECOMP/DISCECTOMY FUSION  11/18/2012   Procedure: ANTERIOR CERVICAL DECOMPRESSION/DISCECTOMY FUSION 1 LEVEL;  Surgeon: Hosie Spangle, MD;  Location: [facility];  Service: Neurosurgery;  Laterality: N/A;  Cervical five-six Anterior cervical decompression/diskectomy/fusion   . BREAST REDUCTION SURGERY    . DILATION AND CURETTAGE OF UTERUS  07/2010   Hysteroscopy NEG w/Dr Mezer    OB History    No data available       Home Medications    Prior to Admission medications   Medication Sig Start Date End Date Taking? Authorizing Provider  amLODipine (NORVASC) 5 MG tablet Take 1 tablet (5 mg total) by mouth daily. 02/06/18   Biagio Borg, MD  cholecalciferol (VITAMIN D) 1000 UNITS tablet Take 2,000 Units by  mouth daily.    [provider]  levothyroxine (SYNTHROID, LEVOTHROID) 25 MCG tablet TAKE 1 TABLET (25 MCG TOTAL) BY MOUTH DAILY BEFORE BREAKFAST. 12/26/17   Biagio Borg, MD  losartan-hydrochlorothiazide (HYZAAR) 100-12.5 MG tablet Take 1 tablet by mouth daily.  02/04/18   Biagio Borg, MD  Multiple Vitamin (MULTIVITAMIN WITH MINERALS) TABS Take 1 tablet by mouth daily.    [provider]  fexofenadine (ALLEGRA) 180 MG tablet Take 180 mg by mouth daily.    02/27/12  [provider]    Family History Family History  Problem Relation Age of Onset  . Other Mother 10       MVA   . Hypertension Mother   . Hypertension Sister   . Hypertension Brother   . Cancer Brother        Prostate    Social History Social History   Tobacco Use  . Smoking status: Never Smoker  . Smokeless tobacco: Never Used  Substance Use Topics  . Alcohol use: No    Alcohol/week: 0.0 oz  . Drug use: No     Allergies   Other and Peanut-containing drug products   Review of Systems Review of Systems  Constitutional:       Per HPI, otherwise negative  HENT:       Per HPI, otherwise negative  Respiratory:       Per HPI, otherwise negative  Cardiovascular:       Per HPI, otherwise negative  Gastrointestinal: Negative for vomiting.  Endocrine:       Negative aside from HPI  Genitourinary:       Neg aside from HPI   Musculoskeletal:       Per HPI, otherwise negative  Skin: Negative.   Neurological: Negative for syncope.     Physical Exam Updated Vital Signs BP (!) 176/98 (BP Location: Right Arm)   Pulse (!) 125   Temp 98.1 F (36.7 C) (Oral)   Resp 20   LMP 01/04/2018   SpO2 99%   Physical Exam  Constitutional: She is oriented to person, place, and time. She appears well-developed and well-nourished. No distress.  Obese young female awake and alert speaking clearly  HENT:  Head: Normocephalic and atraumatic.  Eyes: Conjunctivae and EOM are normal.  Cardiovascular: Regular rhythm. Tachycardia present.  Pulmonary/Chest: Effort normal and breath sounds normal. No stridor. No respiratory distress.  Abdominal: She exhibits no distension.  Musculoskeletal: She exhibits no edema.  Neurological: She is alert and oriented to person,  place, and time. No cranial nerve deficit.  Skin: Skin is warm and dry.  Psychiatric: She has a normal mood and affect.  Nursing note and vitals reviewed.    ED Treatments / Results  Labs (all labs ordered are listed, but only abnormal results are displayed) Labs Reviewed  BASIC METABOLIC PANEL - Abnormal; Notable for the following components:      Result Value   BUN 5 (*)    All other components within normal limits  CBC  I-STAT TROPONIN, ED  I-STAT BETA HCG BLOOD, ED ([facility], [facility], [facility] ONLY)    EKG #1  EKG Interpretation  Date/Time:  Thursday February 07 2018 11:43:58 EST Ventricular Rate:  108 PR Interval:  146 QRS Duration: 86 QT Interval:  332 QTC Calculation: 444 R Axis:   63 Text Interpretation:  Sinus tachycardia Left ventricular hypertrophy with repolarization abnormality T wave abnormality Abnormal ekg Confirmed by Carmin Muskrat [phone]) on 02/07/2018 12:10:17 PM  Recurrent CP (1530)#2   EKG Interpretation  Date/Time:  Thursday February 07 2018 15:33:46 EST Ventricular Rate:  105 PR Interval:  146 QRS Duration: 92 QT Interval:  345 QTC Calculation: 456 R Axis:   63 Text Interpretation:  Sinus tachycardia Consider left atrial enlargement Probable LVH with secondary repol abnrm T wave abnormality No significant change since last tracing Abnormal ekg Confirmed by Carmin Muskrat [phone]) on 02/07/2018 3:41:28 PM        Radiology Dg Chest 2 View  Result Date: 02/07/2018 CLINICAL DATA:  Elevated blood pressure today with chest discomfort. EXAM: CHEST  2 VIEW COMPARISON:  PA and lateral chest and CT chest 07/01/2012. FINDINGS: The lungs are clear. Heart size is upper normal. No pneumothorax or pleural effusion. No acute bony abnormality. IMPRESSION: Negative chest. Electronically Signed   By: Inge Rise M.D.   On: 02/07/2018 12:19    Procedures Procedures (including critical care time)  Medications Ordered in ED Medications - No data to  display   Initial Impression / Assessment and Plan / ED Course  I have reviewed the triage vital signs and the nursing notes.  Pertinent labs & imaging results that were available during my care of the patient were reviewed by me and considered in my medical decision making (see chart for details).    Update:, Patient in no distress, states that she had another episode of chest pain. Repeat EKG similar to prior, nonischemic.  4:11 PM Patient awake and alert, heart rate in the 90s, she appears calm, she is receiving IV fluids. Second troponin unremarkable. This pleasant female presents with concern of ongoing episodic chest pain, palpitations, and difficult to control blood pressure. Patient recently switched to losartan, after discontinuing beta-blocker following unpleasant side effects. Here the patient is awake, alert, though she does have some episodes of transient chest pain, has no sustained pain, no evidence for ischemia, with multiple negative troponins, 2 nonischemic, unchanged EKG There is evidence for persistent hypertension, which has been difficult to control, according to her, but no evidence for new endorgan effects. As the patient started a new medication yesterday, she was encouraged to allow for time for the medication to take effect. With otherwise reassuring findings, no evidence for pneumonia, pneumothorax, ACS, PE, and with improvement here, the patient was discharged in stable condition.  Final Clinical Impressions(s) / ED Diagnoses   Final diagnoses:  Hypertensive urgency  Palpitations     Carmin Muskrat, MD 02/07/18 2229

## 2018-02-07 NOTE — Therapy (Addendum)
[place], [place], [address] Phone: [phone]   Fax:  [phone]  Physical Therapy Treatment / Discharge Summary  Patient Details  Name: Maecie Sevcik MRN: [id number] Date of Birth: [birth year] Referring Provider: Biagio Borg, MD   Encounter Date: 02/07/2018  PT End of Session - 02/07/18 1101    Visit Number  3    Number of Visits  13    Date for PT Re-Evaluation  02/27/18    PT Start Time  0518    PT Stop Time  1100    PT Time Calculation (min)  45 min    Activity Tolerance  Treatment limited secondary to medical complications (Comment) see below    Behavior During Therapy  [facility] for tasks assessed/performed       Past Medical History:  Diagnosis Date  . Allergic rhinitis   . Anxiety 02/27/2012  . Depression   . GERD (gastroesophageal reflux disease) 04/08/2016  . Glucose intolerance (impaired glucose tolerance)   . HTN (hypertension)   . Hyperlipidemia   . Hypothyroidism    DR [name]  . Impaired glucose tolerance 10/04/2011  . Obesity   . OSA (obstructive sleep apnea) 10/08/2014  . Pituitary adenoma ([place])   . Shortness of breath     Past Surgical History:  Procedure Laterality Date  . ANTERIOR CERVICAL DECOMP/DISCECTOMY FUSION  11/18/2012   Procedure: ANTERIOR CERVICAL DECOMPRESSION/DISCECTOMY FUSION 1 LEVEL;  Surgeon: Hosie Spangle, MD;  Location: [facility];  Service: Neurosurgery;  Laterality: N/A;  Cervical five-six Anterior cervical decompression/diskectomy/fusion   . BREAST REDUCTION SURGERY    . DILATION AND CURETTAGE OF UTERUS  07/2010   Hysteroscopy NEG w/Dr Mezer    Vitals:   02/07/18 1021 02/07/18 1034 02/07/18 1039 02/07/18 1059  BP: (!) 175/119 (!) 186/115 (!) 200/120 (!) 218/120    Subjective Assessment - 02/07/18 1021    Subjective  estim was "the best thing ever."  missed 2 weeks of PT due to other appts; Dr Tamala Julian wants to do CT of neck    Pertinent History   hx of cervical fusion    Patient Stated Goals  to feel better, walk better with reduce stiffness/ pain, return to normal activities    Currently in Pain?  Yes    Pain Score  8     Pain Location  Back    Pain Orientation  Right    Pain Descriptors / Indicators  Pins and needles pulling    Pain Type  Chronic pain    Pain Onset  More than a month ago    Pain Frequency  Constant    Aggravating Factors   sitting, standing, lying down, forward bending    Pain Relieving Factors  heating pad, massages                      [facility] Adult PT Treatment/Exercise - 02/07/18 1023      Lumbar Exercises: Aerobic   Nustep  L4 x 6 min             PT Education - 02/07/18 1101    Education provided  Yes    Education Details  advised to go to ED by [facility] nurse triage line due to elevated BP    Person(s) Educated  Patient    Methods  Explanation    Comprehension  Verbalized understanding       PT Short Term Goals - 01/16/18 1115  PT SHORT TERM GOAL #1   Title  pt to be I with inital HEp    Time  3    Period  Weeks    Status  New    Target Date  02/06/18      PT SHORT TERM GOAL #2   Title  pt to vebralize/ demo proper posture in various positions as well as liting mechanics to prevent/ reduce low back pain     Time  3    Period  Weeks    Status  New    Target Date  02/06/18      PT SHORT TERM GOAL #3   Title  Reduce paraspinal spasm to reduce pain to </= 5/10 and promote trunk mobility     Time  3    Period  Weeks    Status  New    Target Date  02/06/18        PT Long Term Goals - 01/16/18 1116      PT LONG TERM GOAL #1   Title  improve trunk flexion to >/= 80 degrees, extension and bil side bending to >/=20 degrees with </= 2/10 pain for functional mobility for ADLs    Time  6    Period  Weeks    Status  New    Target Date  02/27/18      PT LONG TERM GOAL #2   Title  improve bil LE strength to >/= 4/5 in all planes to provide stability and promote  proper form with lifting and walking    Time  6    Period  Weeks    Status  New    Target Date  02/27/18      PT LONG TERM GOAL #3   Title  pt to be able to sit/stand and walk >/= 45 min with </= 2/10 pain for work related tasks and personal goal of returning to walking for exercises.     Time  6    Period  Weeks    Status  New    Target Date  02/27/18      PT LONG TERM GOAL #4   Title  Increase FOTO score to </= 45% limited to demo improvement in function    Time  6    Period  Weeks    Status  New    Target Date  02/27/18      PT LONG TERM GOAL #5   Title  pt to be I with all HEP given as of last visit to maintain and promote current level of function     Time  6    Period  Weeks    Status  New    Target Date  02/27/18            Plan - 02/07/18 1112    Clinical Impression Statement  Pt arrived to session and initiated on NuStep.  Performed NuStep x 6 min and reported during exercise recent change in BP meds due to adverse reaction.  BP checked following NuStep and elevated (see flowsheet).  Checked with automatic cuff and manually and both elevated.  Pt called nurse triage line for PCP, and advised pt go to ED via EMS.  Pt declined EMS but supervisor followed pt up to ED.  Pt c/o headache only following NuStep, and otherwise asymptomatic.  Session ended due to elevated BP.    Rehab Potential  Good    PT Treatment/Interventions  ADLs/Self Care Home Management;Electrical  Stimulation;Cryotherapy;Iontophoresis 71m/ml Dexamethasone;Traction;Moist Heat;Ultrasound;Balance training;Functional mobility training;Therapeutic activities;Therapeutic exercise;Manual techniques;Taping;Passive range of motion;Dry needling;Patient/family education    PT Next Visit Plan  possible posterior rotation of L: hamstring stretching, hip flexor activation. core strengthening, manual for paraspinals, modalities PRN; monitor BP    Consulted and Agree with Plan of Care  Patient       Patient will  benefit from skilled therapeutic intervention in order to improve the following deficits and impairments:  Abnormal gait, Obesity, Decreased strength, Impaired flexibility, Postural dysfunction, Improper body mechanics, Decreased range of motion, Decreased balance, Decreased activity tolerance  Visit Diagnosis: Chronic bilateral low back pain, with sciatica presence unspecified  Muscle spasm of back  Muscle weakness (generalized)  Other abnormalities of gait and mobility     Problem List Patient Active Problem List   Diagnosis Date Noted  . Cervical radiculopathy 01/18/2018  . Acute upper respiratory infection 01/04/2018  . Left shoulder pain 01/04/2018  . Left low back pain 01/04/2018  . Insomnia 12/26/2017  . Hypokalemia 12/26/2017  . Vitamin D deficiency 12/26/2017  . Bilateral hearing loss 12/26/2017  . Cough 04/08/2016  . GERD (gastroesophageal reflux disease) 04/08/2016  . Food allergy 12/29/2014  . OSA on CPAP 12/09/2014  . Severe obesity (BMI >= 40) [place] 12/09/2014  . Nocturia more than twice per night 10/08/2014  . [place]ilation [place] 10/08/2014  . Angioedema 08/29/2014  . Neck pain 07/23/2013  . Goiter 07/23/2013  . Right shoulder pain 10/06/2012  . Fatigue 10/02/2012  . Wrist pain, right 02/27/2012  . Nausea 02/27/2012  . Anxiety 02/27/2012  . Impaired glucose tolerance 10/04/2011  . Encounter for well adult exam with abnormal findings 10/04/2011  . Hyperlipidemia 02/08/2011  . Depression 02/27/2010  . ALLERGIC RHINITIS 02/27/2010  . HYPOTHYROIDISM 12/28/2008  . Essential hypertension 12/28/2008  . DYSPNEA 09/23/2008  . PITUITARY ADENOMA 09/22/2008  . OBESITY 09/22/2008      SLaureen Abrahams PT, DPT 02/07/18 11:22 AM    [facility]GLake[place] [address]Phone: [phone]  Fax:  [phone] Name: KJamayia CrokerMRN: [id number] of Birth:  [birth year]         PHYSICAL THERAPY DISCHARGE SUMMARY  Visits from Start of Care: 3  Current functional level related to goals / functional outcomes: See goals   Remaining deficits: unknown   Education / Equipment: HEP  Plan: Patient agrees to discharge.  Patient goals were not met. Patient is being discharged due to not returning since the last visit.  ?????         Kristoffer Leamon PT, DPT, LAT, ATC  03/13/18  4:00 PM

## 2018-02-07 NOTE — Telephone Encounter (Signed)
FYI

## 2018-02-07 NOTE — Discharge Instructions (Signed)
As discussed, your evaluation today has been largely reassuring.  But, it is important that you monitor your condition carefully, and do not hesitate to return to the ED if you develop new, or concerning changes in your condition. ? ?Otherwise, please follow-up with your physician for appropriate ongoing care. ? ?

## 2018-02-07 NOTE — ED Triage Notes (Signed)
Pt states she was at PT this morning and was having palpitations. She stopped taking metoprolol last Friday. Pt alert and oriented, reports feeling as if her heart is pounding. Pt also reports headache.

## 2018-02-09 ENCOUNTER — Encounter: Payer: Self-pay | Admitting: Internal Medicine

## 2018-02-09 NOTE — Assessment & Plan Note (Signed)
stable overall by history and exam, recent data reviewed with pt, and pt to continue medical treatment as before,  to f/u any worsening symptoms or concerns Lab Results  Component Value Date   HGBA1C 5.7 02/16/2017

## 2018-02-09 NOTE — Assessment & Plan Note (Signed)
Somewhat labile recently, now improved, cont same tx, cont to monitor at home and next visit

## 2018-02-09 NOTE — Assessment & Plan Note (Signed)
May be somewhat related to labile HTN, d/w pt, declines further evaluation or tx or referral counseling

## 2018-02-11 ENCOUNTER — Ambulatory Visit: Payer: Self-pay

## 2018-02-11 ENCOUNTER — Telehealth: Payer: Self-pay | Admitting: Internal Medicine

## 2018-02-11 MED ORDER — NADOLOL 40 MG PO TABS: 40.0000 mg | ORAL_TABLET | Freq: Every day | ORAL | 3 refills | Status: DC

## 2018-02-11 NOTE — Telephone Encounter (Signed)
Pt. Reports she instructed by her doctor to call back if BP still up and he would call a different medicine in. Reports she was in the ED last week with BP as well. Reason for Disposition . Systolic BP  >= 412 OR Diastolic >= 820  Answer Assessment - Initial Assessment Questions 1. BLOOD PRESSURE: "What is the blood pressure?" "Did you take at least two measurements 5 minutes apart?"     159/106 2. ONSET: "When did you take your blood pressure?"     This morning 3. HOW: "How did you obtain the blood pressure?" (e.g., visiting nurse, automatic home BP monitor)     Blood pressure 4. HISTORY: "Do you have a history of high blood pressure?"     Yes 5. MEDICATIONS: "Are you taking any medications for blood pressure?" "Have you missed any doses recently?"     Yes 6. OTHER SYMPTOMS: "Do you have any symptoms?" (e.g., headache, chest pain, blurred vision, difficulty breathing, weakness)     Headache 7. PREGNANCY: "Is there any chance you are pregnant?" "When was your last menstrual period?"     No  Protocols used: HIGH BLOOD PRESSURE-A-AH

## 2018-02-11 NOTE — Telephone Encounter (Signed)
Ok for corgard 40 qd

## 2018-02-11 NOTE — Telephone Encounter (Signed)
Copied from [place] [id number]. Topic: Inquiry >> Feb 11, 2018 11:51 AM [name], NT wrote: Patient states she was asked not to be put on Losartan BP medication and she said Dr. Jenny Reichmann prescribed it to see how it worked. She states she wants to be put on goguard that was discussed in the [facility] on 02/06/18. She states she had to be seen in the ER because her BP was high in regards to the medication not working for her. 159/106 is what her BP is as of right now. Patient was transferred to NT . Patient is requesting Dr. Jenny Reichmann change her medication. Please advise

## 2018-02-14 ENCOUNTER — Ambulatory Visit: Payer: BLUE CROSS/BLUE SHIELD | Admitting: Physical Therapy

## 2018-02-19 ENCOUNTER — Ambulatory Visit: Payer: BLUE CROSS/BLUE SHIELD | Admitting: Physical Therapy

## 2018-03-05 ENCOUNTER — Encounter: Payer: BLUE CROSS/BLUE SHIELD | Admitting: Physical Therapy

## 2018-03-07 ENCOUNTER — Other Ambulatory Visit: Payer: BLUE CROSS/BLUE SHIELD

## 2018-03-08 ENCOUNTER — Other Ambulatory Visit: Payer: Self-pay | Admitting: Internal Medicine

## 2018-03-08 ENCOUNTER — Other Ambulatory Visit: Payer: BLUE CROSS/BLUE SHIELD

## 2018-04-09 ENCOUNTER — Encounter: Payer: Self-pay | Admitting: Internal Medicine

## 2018-04-09 ENCOUNTER — Ambulatory Visit: Payer: BLUE CROSS/BLUE SHIELD | Admitting: Internal Medicine

## 2018-04-09 VITALS — BP 164/92 | HR 103 | Temp 97.9°F | Ht 64.0 in | Wt 312.0 lb

## 2018-04-09 DIAGNOSIS — F329 Major depressive disorder, single episode, unspecified: Secondary | ICD-10-CM

## 2018-04-09 DIAGNOSIS — I10 Essential (primary) hypertension: Secondary | ICD-10-CM

## 2018-04-09 DIAGNOSIS — R51 Headache: Secondary | ICD-10-CM | POA: Diagnosis not present

## 2018-04-09 DIAGNOSIS — R519 Headache, unspecified: Secondary | ICD-10-CM

## 2018-04-09 DIAGNOSIS — R7302 Impaired glucose tolerance (oral): Secondary | ICD-10-CM

## 2018-04-09 DIAGNOSIS — F32A Depression, unspecified: Secondary | ICD-10-CM

## 2018-04-09 MED ORDER — AMLODIPINE BESYLATE 5 MG PO TABS: 5.0000 mg | ORAL_TABLET | Freq: Every day | ORAL | 3 refills | Status: DC

## 2018-04-09 MED ORDER — SUMATRIPTAN SUCCINATE 50 MG PO TABS
50.0000 mg | ORAL_TABLET | ORAL | 5 refills | Status: DC | PRN
Start: 2018-04-09 — End: 2018-06-10

## 2018-04-09 MED ORDER — NADOLOL 80 MG PO TABS: 80.0000 mg | ORAL_TABLET | Freq: Every day | ORAL | 3 refills | Status: DC

## 2018-04-09 NOTE — Assessment & Plan Note (Signed)
Likely related to uncontrolled HTN and possible migraine, ok for imitrex prn

## 2018-04-09 NOTE — Progress Notes (Signed)
Subjective:    Patient ID: Chelsea Thompson, female    DOB: September 24, 1971, 47 y.o.   MRN: [id number]  HPI  Here to f/u recent HTN and HA, seen in ED with htn urgency and improved with tx, but since then BP at home remain similar to today.  Is not taking the losartan HCT as she is planning to try one more time for fertility tx and has GYN appt may 30.  Amlodipine was reduced to 5 mg due to pedal edema.  Tolerating Nadolol 40 mg, and in fact has been on this for 15 yrs, she feels comfortable with this. Pt denies chest pain, increased sob or doe, wheezing, orthopnea, PND, increased LE swelling, palpitations, dizziness or syncope.  Pt denies new neurological symptoms such as new facial or extremity weakness or numbness, but has had recurring HA, at least some with throbbing with nausea and blurred vision, has been under quite a bit of stress recently. Denies worsening depressive symptoms, suicidal ideation, or panic  Chronic neck and back pain may also be a factor, and has f/u appt with Dr Nudelman/NS may 21.   Pt denies polydipsia, polyuria   BP Readings from Last 3 Encounters:  04/09/18 (!) 164/92  02/07/18 140/83  02/07/18 (!) 218/120   Past Medical History:  Diagnosis Date  . Allergic rhinitis   . Anxiety 02/27/2012  . Depression   . GERD (gastroesophageal reflux disease) 04/08/2016  . Glucose intolerance (impaired glucose tolerance)   . HTN (hypertension)   . Hyperlipidemia   . Hypothyroidism    DR [name]  . Impaired glucose tolerance 10/04/2011  . Obesity   . OSA (obstructive sleep apnea) 10/08/2014  . Pituitary adenoma ([place])   . Shortness of breath    Past Surgical History:  Procedure Laterality Date  . ANTERIOR CERVICAL DECOMP/DISCECTOMY FUSION  11/18/2012   Procedure: ANTERIOR CERVICAL DECOMPRESSION/DISCECTOMY FUSION 1 LEVEL;  Surgeon: Hosie Spangle, MD;  Location: [facility];  Service: Neurosurgery;  Laterality: N/A;  Cervical five-six Anterior cervical  decompression/diskectomy/fusion   . BREAST REDUCTION SURGERY    . DILATION AND CURETTAGE OF UTERUS  07/2010   Hysteroscopy NEG w/Dr Mezer    reports that she has never smoked. She has never used smokeless tobacco. She reports that she does not drink alcohol or use drugs. family history includes Cancer in her brother; Hypertension in her brother, mother, and sister; Other (age of onset: 81) in her mother. Allergies  Allergen Reactions  . Other Anaphylaxis    shellfish  . Peanut-Containing Drug Products    Current Outpatient Medications on File Prior to Visit  Medication Sig Dispense Refill  . cholecalciferol (VITAMIN D) 1000 UNITS tablet Take 2,000 Units by mouth daily.    . cyclobenzaprine (FLEXERIL) 5 MG tablet TAKE 1 TABLET BY MOUTH THREE TIMES A DAY AS NEEDED FOR MUSCLE SPASMS 60 tablet 1  . levothyroxine (SYNTHROID, LEVOTHROID) 25 MCG tablet TAKE 1 TABLET (25 MCG TOTAL) BY MOUTH DAILY BEFORE BREAKFAST. 90 tablet 3  . Multiple Vitamin (MULTIVITAMIN WITH MINERALS) TABS Take 1 tablet by mouth daily.    . [DISCONTINUED] fexofenadine (ALLEGRA) 180 MG tablet Take 180 mg by mouth daily.       No current facility-administered medications on file prior to visit.    Review of Systems  Constitutional: Negative for other unusual diaphoresis or sweats HENT: Negative for ear discharge or swelling Eyes: Negative for other worsening visual disturbances Respiratory: Negative for stridor or other swelling  Gastrointestinal: Negative for  worsening distension or other blood Genitourinary: Negative for retention or other urinary change Musculoskeletal: Negative for other MSK pain or swelling Skin: Negative for color change or other new lesions Neurological: Negative for worsening tremors and other numbness  Psychiatric/Behavioral: Negative for worsening agitation or other fatigue All other system neg per pt    Objective:   Physical Exam BP 138/88   Pulse (!) 103   Temp 97.9 F (36.6 C) (Oral)    Ht 5\' 4"  (1.626 m)   Wt (!) 312 lb (141.5 kg)   SpO2 98%   BMI 53.55 kg/m  Repeat BP left arm large cuff   - 164/92, super morbid obese VS noted, not ill appearing Constitutional: Pt appears in NAD HENT: Head: NCAT.  Right Ear: External ear normal.  Left Ear: External ear normal.  Eyes: . Pupils are equal, round, and reactive to light. Conjunctivae and EOM are normal Nose: without d/c or deformity Neck: Neck supple. Gross normal ROM Cardiovascular: Normal rate and regular rhythm.   Pulmonary/Chest: Effort normal and breath sounds without rales or wheezing.  Abd:  Soft, NT, ND, + BS, no organomegaly Neurological: Pt is alert. At baseline orientation, motor grossly intact Skin: Skin is warm. No rashes, other new lesions, no LE edema Psychiatric: Pt behavior is normal without agitation , mild nervous No other exam findings, mild nervousd, not depressed affect Lab Results  Component Value Date   WBC 6.8 02/07/2018   HGB 13.3 02/07/2018   HCT 40.8 02/07/2018   PLT 322 02/07/2018   GLUCOSE 97 02/07/2018   CHOL 275 (H) 02/16/2017   TRIG 129.0 02/16/2017   HDL 58.80 02/16/2017   LDLDIRECT 181.7 08/27/2014   LDLCALC 190 (H) 02/16/2017   ALT 30 02/16/2017   AST 18 02/16/2017   NA 137 02/07/2018   K 3.7 02/07/2018   CL 103 02/07/2018   CREATININE 0.64 02/07/2018   BUN 5 (L) 02/07/2018   CO2 24 02/07/2018   TSH 1.057 02/07/2018   HGBA1C 5.7 02/16/2017       Assessment & Plan:

## 2018-04-09 NOTE — Assessment & Plan Note (Signed)
stable overall by history and exam, recent data reviewed with pt, and pt to continue medical treatment as before,  to f/u any worsening symptoms or concerns Lab Results  Component Value Date   HGBA1C 5.7 02/16/2017

## 2018-04-09 NOTE — Assessment & Plan Note (Signed)
stable overall by history and exam, and pt to continue medical treatment as before,  to f/u any worsening symptoms or concerns 

## 2018-04-09 NOTE — Assessment & Plan Note (Addendum)
Pt remains moderately uncontrolled, to cont amlod 5, increase nadolol to 80 qd, consider change to labetolol if not improved  Note:  Total time for pt hx, exam, review of record with pt in the room, determination of diagnoses and plan for further eval and tx is > 40 min, with over 50% spent in coordination and counseling of patient including the differential dx, tx, further evaluation and other management of uncontrolled HTN, depression, chronic pain, headache, hyperglycemia

## 2018-04-09 NOTE — Patient Instructions (Addendum)
OK to stay off the losartan HCT  OK to STOP the potassium if you are not taking the losartan HCT  OK to increase the Nadolol to 80 mg daily  Please take all new medication as prescribed - the imitrex for HA  Please continue to monitor your Blood Pressure at home and call for persistent BPs more than 140/90  Please continue all other medications as before, and refills have been done if requested - the amlodipine 5 mg daily  Please have the pharmacy call with any other refills you may need.  Please keep your appointments with your specialists as you may have planned - Dr Sherwood Gambler and GYN in May  Please return in 3 months, or sooner if needed

## 2018-04-10 ENCOUNTER — Other Ambulatory Visit: Payer: Self-pay | Admitting: Endocrinology

## 2018-04-10 DIAGNOSIS — E221 Hyperprolactinemia: Secondary | ICD-10-CM

## 2018-04-22 ENCOUNTER — Other Ambulatory Visit: Payer: Self-pay | Admitting: Internal Medicine

## 2018-04-22 ENCOUNTER — Ambulatory Visit
Admission: RE | Admit: 2018-04-22 | Discharge: 2018-04-22 | Disposition: A | Discharge status: Home / Self Care | Payer: BLUE CROSS/BLUE SHIELD | Source: Ambulatory Visit | Attending: Endocrinology | Admitting: Endocrinology

## 2018-04-22 DIAGNOSIS — E221 Hyperprolactinemia: Secondary | ICD-10-CM

## 2018-04-22 MED ORDER — GADOBENATE DIMEGLUMINE 529 MG/ML IV SOLN
10.0000 mL | Freq: Once | INTRAVENOUS | Status: AC | PRN
  Administered 2018-04-22: 10 mL via INTRAVENOUS

## 2018-04-24 ENCOUNTER — Telehealth: Payer: Self-pay

## 2018-04-24 ENCOUNTER — Ambulatory Visit: Payer: BLUE CROSS/BLUE SHIELD

## 2018-04-24 MED ORDER — TELMISARTAN 80 MG PO TABS: 80.0000 mg | ORAL_TABLET | Freq: Every day | ORAL | 3 refills | Status: DC

## 2018-04-24 NOTE — Telephone Encounter (Signed)
Routing to dr Jenny Reichmann, patient came in today (nurse visit) to have bp re-checked after increasing nadolol to 80mg ---her reading today is 168/88 and this is consistent with the readings she has been getting at home for the last 2 weeks---please advise what changes you are making with medications, I will call patient back, thanks

## 2018-04-24 NOTE — Telephone Encounter (Signed)
Ok to add Micardis 80 mg starting with HALF pill per day, but if BP remains high after 4-5 days, she can take the whole pill  I will send rx

## 2018-04-24 NOTE — Addendum Note (Signed)
Addended by: Biagio Borg on: 04/24/2018 05:54 PM   Modules accepted: Orders

## 2018-04-25 NOTE — Telephone Encounter (Signed)
Left more messages on both phone numbers, can talk with Darleen Moffitt,RN at [place] office if needed

## 2018-04-25 NOTE — Telephone Encounter (Signed)
Left message advising of dr Chelsea Thompson note/;instructions---I will continue calling her to see if I can speak with her directly to make sure she understands med changes

## 2018-04-29 NOTE — Telephone Encounter (Signed)
I reviewed the ED visit from feb 2019, which was one day after starting the losartan, with complaints of CP, palpitations and HTN.  There was no finding at that time of adverse effect of the losartan, and pt was directed to continue.   I would not change the micardis recommendation, as there is no evidence for allergy or other adverse effect.  Please ask pt to return if her BP remains elevated or her anxiety worsens

## 2018-04-29 NOTE — Telephone Encounter (Signed)
Patient has had reaction in the past to losartan---she ended up in the hospital---patient wants to make sure this new medication, micardis (telmisartan) is not in the same group as the medication she had the reaction to---she has not started new medication,micardis yet.  Please advise if you want the patient to continue with new medication or if something else needs to be called in---I will call patient back (on mobile phone number, ok to leave message), thanks

## 2018-04-29 NOTE — Telephone Encounter (Signed)
Left message advising patient of dr Jenny Reichmann note/instructions (ok to leave message on cell phone per patient when I talked with her earlier today)---message also states for patient to call back to make office visit with dr Jenny Reichmann if she wants to take another medication anyways.  Can talk with tamara,RN at [place] office if needed.

## 2018-05-02 ENCOUNTER — Other Ambulatory Visit: Payer: Self-pay | Admitting: Neurosurgery

## 2018-05-02 DIAGNOSIS — M542 Cervicalgia: Secondary | ICD-10-CM

## 2018-05-21 ENCOUNTER — Encounter: Payer: Self-pay | Admitting: Internal Medicine

## 2018-05-21 ENCOUNTER — Other Ambulatory Visit (INDEPENDENT_AMBULATORY_CARE_PROVIDER_SITE_OTHER): Payer: BLUE CROSS/BLUE SHIELD

## 2018-05-21 ENCOUNTER — Ambulatory Visit: Payer: BLUE CROSS/BLUE SHIELD | Admitting: Internal Medicine

## 2018-05-21 ENCOUNTER — Ambulatory Visit: Payer: Self-pay

## 2018-05-21 VITALS — BP 156/98 | HR 88 | Temp 97.9°F | Ht 64.0 in | Wt 317.0 lb

## 2018-05-21 DIAGNOSIS — M549 Dorsalgia, unspecified: Secondary | ICD-10-CM

## 2018-05-21 DIAGNOSIS — G8929 Other chronic pain: Secondary | ICD-10-CM | POA: Diagnosis not present

## 2018-05-21 DIAGNOSIS — R1032 Left lower quadrant pain: Secondary | ICD-10-CM | POA: Diagnosis not present

## 2018-05-21 DIAGNOSIS — I10 Essential (primary) hypertension: Secondary | ICD-10-CM

## 2018-05-21 DIAGNOSIS — M542 Cervicalgia: Secondary | ICD-10-CM | POA: Diagnosis not present

## 2018-05-21 LAB — URINALYSIS, ROUTINE W REFLEX MICROSCOPIC
Bilirubin Urine: NEGATIVE
Hgb urine dipstick: NEGATIVE
Ketones, ur: NEGATIVE
Leukocytes, UA: NEGATIVE
Nitrite: NEGATIVE
RBC / HPF: NONE SEEN (ref 0–?)
Specific Gravity, Urine: 1.015 (ref 1.000–1.030)
Total Protein, Urine: NEGATIVE
Urine Glucose: NEGATIVE
Urobilinogen, UA: 0.2 (ref 0.0–1.0)
pH: 6.5 (ref 5.0–8.0)

## 2018-05-21 MED ORDER — HYDROCHLOROTHIAZIDE 12.5 MG PO CAPS: 12.5000 mg | ORAL_CAPSULE | Freq: Every day | ORAL | 3 refills | Status: DC

## 2018-05-21 MED ORDER — CELECOXIB 200 MG PO CAPS: 200.0000 mg | ORAL_CAPSULE | Freq: Two times a day (BID) | ORAL | 5 refills | Status: DC | PRN

## 2018-05-21 MED ORDER — TRAMADOL HCL 50 MG PO TABS: 50.0000 mg | ORAL_TABLET | Freq: Four times a day (QID) | ORAL | 1 refills | Status: DC | PRN

## 2018-05-21 NOTE — Telephone Encounter (Signed)
Noted  

## 2018-05-21 NOTE — Assessment & Plan Note (Signed)
Still mild uncontrolled, for add hct 12.5 qd, cont all other tx,  to f/u any worsening symptoms or concerns

## 2018-05-21 NOTE — Assessment & Plan Note (Addendum)
Etiology unclear, suspect persistent constipation, for otc miralax or other laxative trial, doubt diverticulitis or other; for pain control, also check UA  Note:  Total time for pt hx, exam, review of record with pt in the room, determination of diagnoses and plan for further eval and tx is > 40 min, with over 50% spent in coordination and counseling of patient including the differential dx, tx, further evaluation and other management of LLQ pain, back and neck pain, and HTN uncontrolled

## 2018-05-21 NOTE — Patient Instructions (Signed)
Please take all new medication as prescribed - the mild fluid pill for Blood pressure, as well as tramadol and celebrex as needed for pain  Please continue all other medications as before, including the micardis 80 mg you just started  Please have the pharmacy call with any other refills you may need.  Please keep your appointments with your specialists as you may have planned - MRI tomorrow, and Neurosurgury as planned  You will be contacted regarding the referral for: Rheumatology  Please go to the LAB in the Basement (turn left off the elevator) for the tests to be done today - the urine testing today  You will be contacted by phone if any changes need to be made immediately.  Otherwise, you will receive a letter about your results with an explanation, but please check with MyChart first.  Please remember to sign up for MyChart if you have not done so, as this will be important to you in the future with finding out test results, communicating by private email, and scheduling acute appointments online when needed.

## 2018-05-21 NOTE — Assessment & Plan Note (Signed)
For MRI as per NS tomorrow, ok for rheum referral,  to f/u any worsening symptoms or concerns

## 2018-05-21 NOTE — Progress Notes (Signed)
Subjective:    Patient ID: Chelsea Thompson, female    DOB: 03/24/1971, 47 y.o.   MRN: [id number]  HPI  Here with 2 wks LLQ pain, sharp and sometimes crampy, assoc with constipation last wk but does not think constipated now; gasx not helping, has not tried laxative, seemed better with camomille tea and overall pain less but persists about 3/10 today.  No fever, and Denies worsening reflux, other abd pain, dysphagia, n/v, diarrhea or blood.  Has GYN appt next wk for routine follow up.  Denies urinary symptoms such as dysuria, frequency, urgency, flank pain, hematuria or n/v, fever, chills.  BP remains elevated at home, and is taking the micardis 80 mg but first dose was today.  Has seen NS for back pain, has MRI per pt states for neck and lower back, and brings info per NS today suggesting referral to Rheum for possible FMS.  Pt denies chest pain, increased sob or doe, wheezing, orthopnea, PND, increased LE swelling, palpitations, dizziness or syncope.  Pt denies new neurological symptoms such as new headache, or facial or extremity weakness or numbness   Pt denies polydipsia, polyuria, BP Readings from Last 3 Encounters:  05/21/18 (!) 156/98  04/24/18 (!) 168/88  04/09/18 (!) 164/92   Wt Readings from Last 3 Encounters:  05/21/18 (!) 317 lb (143.8 kg)  04/09/18 (!) 312 lb (141.5 kg)  02/06/18 (!) 314 lb (142.4 kg)   Past Medical History:  Diagnosis Date  . Allergic rhinitis   . Anxiety 02/27/2012  . Depression   . GERD (gastroesophageal reflux disease) 04/08/2016  . Glucose intolerance (impaired glucose tolerance)   . HTN (hypertension)   . Hyperlipidemia   . Hypothyroidism    DR [name]  . Impaired glucose tolerance 10/04/2011  . Obesity   . OSA (obstructive sleep apnea) 10/08/2014  . Pituitary adenoma ([place])   . Shortness of breath    Past Surgical History:  Procedure Laterality Date  . ANTERIOR CERVICAL DECOMP/DISCECTOMY FUSION  11/18/2012   Procedure: ANTERIOR CERVICAL  DECOMPRESSION/DISCECTOMY FUSION 1 LEVEL;  Surgeon: Hosie Spangle, MD;  Location: [facility];  Service: Neurosurgery;  Laterality: N/A;  Cervical five-six Anterior cervical decompression/diskectomy/fusion   . BREAST REDUCTION SURGERY    . DILATION AND CURETTAGE OF UTERUS  07/2010   Hysteroscopy NEG w/Dr Mezer    reports that she has never smoked. She has never used smokeless tobacco. She reports that she does not drink alcohol or use drugs. family history includes Cancer in her brother; Hypertension in her brother, mother, and sister; Other (age of onset: 78) in her mother. Allergies  Allergen Reactions  . Other Anaphylaxis    shellfish  . Peanut-Containing Drug Products    Current Outpatient Medications on File Prior to Visit  Medication Sig Dispense Refill  . amLODipine (NORVASC) 5 MG tablet Take 1 tablet (5 mg total) by mouth daily. 90 tablet 3  . cholecalciferol (VITAMIN D) 1000 UNITS tablet Take 2,000 Units by mouth daily.    . cyclobenzaprine (FLEXERIL) 5 MG tablet TAKE 1 TABLET BY MOUTH THREE TIMES A DAY AS NEEDED FOR MUSCLE SPASMS 60 tablet 1  . levothyroxine (SYNTHROID, LEVOTHROID) 25 MCG tablet TAKE 1 TABLET (25 MCG TOTAL) BY MOUTH DAILY BEFORE BREAKFAST. 90 tablet 3  . Multiple Vitamin (MULTIVITAMIN WITH MINERALS) TABS Take 1 tablet by mouth daily.    . nadolol (CORGARD) 80 MG tablet Take 1 tablet (80 mg total) by mouth daily. 90 tablet 3  . SUMAtriptan (IMITREX)  50 MG tablet Take 1 tablet (50 mg total) by mouth every 2 (two) hours as needed for migraine. May repeat in 2 hours if headache persists or recurs. 10 tablet 5  . telmisartan (MICARDIS) 80 MG tablet Take 1 tablet (80 mg total) by mouth daily. 90 tablet 3  . [DISCONTINUED] fexofenadine (ALLEGRA) 180 MG tablet Take 180 mg by mouth daily.       No current facility-administered medications on file prior to visit.    Review of Systems  Constitutional: Negative for other unusual diaphoresis or sweats HENT: Negative for  ear discharge or swelling Eyes: Negative for other worsening visual disturbances Respiratory: Negative for stridor or other swelling  Gastrointestinal: Negative for worsening distension or other blood Genitourinary: Negative for retention or other urinary change Musculoskeletal: Negative for other MSK pain or swelling Skin: Negative for color change or other new lesions Neurological: Negative for worsening tremors and other numbness  Psychiatric/Behavioral: Negative for worsening agitation or other fatigue All other system neg per pt    Objective:   Physical Exam BP (!) 156/98   Pulse 88   Temp 97.9 F (36.6 C) (Oral)   Ht 5\' 4"  (1.626 m)   Wt (!) 317 lb (143.8 kg)   SpO2 99%   BMI 54.41 kg/m  VS noted,  Constitutional: Pt appears in NAD HENT: Head: NCAT.  Right Ear: External ear normal.  Left Ear: External ear normal.  Eyes: . Pupils are equal, round, and reactive to light. Conjunctivae and EOM are normal Nose: without d/c or deformity Neck: Neck supple. Gross normal ROM Cardiovascular: Normal rate and regular rhythm.   Pulmonary/Chest: Effort normal and breath sounds without rales or wheezing.  Abd:  Soft, ND, + BS, no organomegaly with mild LLQ tender without guarding or rebound Neurological: Pt is alert. At baseline orientation, motor grossly intact Spine nontender; has diffuse tender to bilat upper back and lumbar paravertebral Skin: Skin is warm. No rashes, other new lesions, no LE edema Psychiatric: Pt behavior is normal without agitation  No other exam findings  Lab Results  Component Value Date   WBC 6.8 02/07/2018   HGB 13.3 02/07/2018   HCT 40.8 02/07/2018   PLT 322 02/07/2018   GLUCOSE 97 02/07/2018   CHOL 275 (H) 02/16/2017   TRIG 129.0 02/16/2017   HDL 58.80 02/16/2017   LDLDIRECT 181.7 08/27/2014   LDLCALC 190 (H) 02/16/2017   ALT 30 02/16/2017   AST 18 02/16/2017   NA 137 02/07/2018   K 3.7 02/07/2018   CL 103 02/07/2018   CREATININE 0.64  02/07/2018   BUN 5 (L) 02/07/2018   CO2 24 02/07/2018   TSH 1.057 02/07/2018   HGBA1C 5.7 02/16/2017      Assessment & Plan:

## 2018-05-21 NOTE — Telephone Encounter (Signed)
Pt. Reports she has had abdominal pain x 2 weeks that is gradually getting worse. Left lower quadrant of abdomen. States she was constipated last week. Last BM - today.Denies any fever or blood in stool. Pain comes and goes.Appointment made for today. Reason for Disposition . [1] MODERATE pain (e.g., interferes with normal activities) AND [2] pain comes and goes (cramps) AND [3] present > 24 hours  (Exception: pain with Vomiting or Diarrhea - see that Guideline)  Answer Assessment - Initial Assessment Questions 1. LOCATION: "Where does it hurt?"      Lower left side 2. RADIATION: "Does the pain shoot anywhere else?" (e.g., chest, back)     No 3. ONSET: "When did the pain begin?" (e.g., minutes, hours or days ago)      Started 2 weeks 4. SUDDEN: "Gradual or sudden onset?"     Gradual 5. PATTERN "Does the pain come and go, or is it constant?"    - If constant: "Is it getting better, staying the same, or worsening?"      (Note: Constant means the pain never goes away completely; most serious pain is constant and it progresses)     - If intermittent: "How long does it last?" "Do you have pain now?"     (Note: Intermittent means the pain goes away completely between bouts)     Comes and goes 6. SEVERITY: "How bad is the pain?"  (e.g., Scale 1-10; mild, moderate, or severe)   - MILD (1-3): doesn't interfere with normal activities, abdomen soft and not tender to touch    - MODERATE (4-7): interferes with normal activities or awakens from sleep, tender to touch    - SEVERE (8-10): excruciating pain, doubled over, unable to do any normal activities      10 7. RECURRENT SYMPTOM: "Have you ever had this type of abdominal pain before?" If so, ask: "When was the last time?" and "What happened that time?"      No 8. CAUSE: "What do you think is causing the abdominal pain?"     Unsure 9. RELIEVING/AGGRAVATING FACTORS: "What makes it better or worse?" (e.g., movement, antacids, bowel movement)     Last  BM - Today. Constipated last week 10. OTHER SYMPTOMS: "Has there been any vomiting, diarrhea, constipation, or urine problems?"       Nausea 11. PREGNANCY: "Is there any chance you are pregnant?" "When was your last menstrual period?"       No  Protocols used: ABDOMINAL PAIN - [facility]

## 2018-05-22 ENCOUNTER — Ambulatory Visit
Admission: RE | Admit: 2018-05-22 | Discharge: 2018-05-22 | Disposition: A | Discharge status: Home / Self Care | Payer: BLUE CROSS/BLUE SHIELD | Source: Ambulatory Visit | Attending: Neurosurgery | Admitting: Neurosurgery

## 2018-05-22 DIAGNOSIS — M542 Cervicalgia: Secondary | ICD-10-CM

## 2018-06-04 ENCOUNTER — Telehealth: Payer: Self-pay

## 2018-06-04 ENCOUNTER — Ambulatory Visit: Payer: BLUE CROSS/BLUE SHIELD

## 2018-06-04 MED ORDER — HYDROCHLOROTHIAZIDE 25 MG PO TABS: 25.0000 mg | ORAL_TABLET | Freq: Every day | ORAL | 3 refills | Status: DC

## 2018-06-04 NOTE — Telephone Encounter (Signed)
Ok to increase the HCT to 25 mg per day

## 2018-06-04 NOTE — Telephone Encounter (Signed)
Patient came in today (nurse visit) to have bp rechecked, she has taken all 4 medications regularly for bp management---today's reading was 140/110---routing to dr Jenny Reichmann, please advise what changes you want to make to bp meds, I will call patient back, ok to leave message per patient request

## 2018-06-05 ENCOUNTER — Emergency Department (HOSPITAL_COMMUNITY): Payer: BLUE CROSS/BLUE SHIELD

## 2018-06-05 ENCOUNTER — Encounter (HOSPITAL_COMMUNITY): Payer: Self-pay

## 2018-06-05 ENCOUNTER — Inpatient Hospital Stay (HOSPITAL_COMMUNITY)
Admission: EM | Admit: 2018-06-05 | Discharge: 2018-06-10 | DRG: 305 | Disposition: A | Discharge status: Home / Self Care | Payer: BLUE CROSS/BLUE SHIELD | Attending: Internal Medicine | Admitting: Internal Medicine

## 2018-06-05 ENCOUNTER — Ambulatory Visit: Payer: Self-pay

## 2018-06-05 ENCOUNTER — Other Ambulatory Visit: Payer: Self-pay

## 2018-06-05 ENCOUNTER — Ambulatory Visit: Payer: BLUE CROSS/BLUE SHIELD | Admitting: Family

## 2018-06-05 ENCOUNTER — Emergency Department (HOSPITAL_COMMUNITY)
Admission: EM | Admit: 2018-06-05 | Discharge: 2018-06-05 | Discharge status: Absent without leave | Payer: BLUE CROSS/BLUE SHIELD

## 2018-06-05 ENCOUNTER — Encounter: Payer: Self-pay | Admitting: Family

## 2018-06-05 VITALS — BP 148/98 | HR 71 | Temp 97.9°F | Ht 64.0 in | Wt 318.0 lb

## 2018-06-05 DIAGNOSIS — E785 Hyperlipidemia, unspecified: Secondary | ICD-10-CM | POA: Diagnosis not present

## 2018-06-05 DIAGNOSIS — R200 Anesthesia of skin: Secondary | ICD-10-CM | POA: Diagnosis not present

## 2018-06-05 DIAGNOSIS — K5900 Constipation, unspecified: Secondary | ICD-10-CM | POA: Diagnosis present

## 2018-06-05 DIAGNOSIS — G4733 Obstructive sleep apnea (adult) (pediatric): Secondary | ICD-10-CM | POA: Diagnosis present

## 2018-06-05 DIAGNOSIS — Z9989 Dependence on other enabling machines and devices: Secondary | ICD-10-CM | POA: Diagnosis not present

## 2018-06-05 DIAGNOSIS — R079 Chest pain, unspecified: Secondary | ICD-10-CM | POA: Diagnosis not present

## 2018-06-05 DIAGNOSIS — R202 Paresthesia of skin: Secondary | ICD-10-CM | POA: Diagnosis not present

## 2018-06-05 DIAGNOSIS — I110 Hypertensive heart disease with heart failure: Secondary | ICD-10-CM | POA: Diagnosis present

## 2018-06-05 DIAGNOSIS — Z981 Arthrodesis status: Secondary | ICD-10-CM

## 2018-06-05 DIAGNOSIS — Z79890 Hormone replacement therapy (postmenopausal): Secondary | ICD-10-CM

## 2018-06-05 DIAGNOSIS — E039 Hypothyroidism, unspecified: Secondary | ICD-10-CM | POA: Diagnosis present

## 2018-06-05 DIAGNOSIS — R002 Palpitations: Secondary | ICD-10-CM

## 2018-06-05 DIAGNOSIS — R1032 Left lower quadrant pain: Secondary | ICD-10-CM | POA: Diagnosis not present

## 2018-06-05 DIAGNOSIS — I5032 Chronic diastolic (congestive) heart failure: Secondary | ICD-10-CM | POA: Diagnosis present

## 2018-06-05 DIAGNOSIS — Z91010 Allergy to peanuts: Secondary | ICD-10-CM

## 2018-06-05 DIAGNOSIS — I272 Pulmonary hypertension, unspecified: Secondary | ICD-10-CM | POA: Diagnosis present

## 2018-06-05 DIAGNOSIS — I10 Essential (primary) hypertension: Secondary | ICD-10-CM | POA: Diagnosis not present

## 2018-06-05 DIAGNOSIS — G4700 Insomnia, unspecified: Secondary | ICD-10-CM | POA: Diagnosis present

## 2018-06-05 DIAGNOSIS — Z6841 Body Mass Index (BMI) 40.0 and over, adult: Secondary | ICD-10-CM

## 2018-06-05 DIAGNOSIS — Z91013 Allergy to seafood: Secondary | ICD-10-CM

## 2018-06-05 DIAGNOSIS — I160 Hypertensive urgency: Secondary | ICD-10-CM | POA: Diagnosis present

## 2018-06-05 DIAGNOSIS — E876 Hypokalemia: Secondary | ICD-10-CM | POA: Diagnosis present

## 2018-06-05 DIAGNOSIS — Z79899 Other long term (current) drug therapy: Secondary | ICD-10-CM

## 2018-06-05 DIAGNOSIS — R109 Unspecified abdominal pain: Secondary | ICD-10-CM | POA: Diagnosis present

## 2018-06-05 HISTORY — DX: Adverse effect of unspecified anesthetic, initial encounter: T41.45XA

## 2018-06-05 HISTORY — DX: Other chronic pain: G89.29

## 2018-06-05 HISTORY — DX: Other complications of anesthesia, initial encounter: T88.59XA

## 2018-06-05 HISTORY — DX: Low back pain: M54.5

## 2018-06-05 HISTORY — DX: Dependence on other enabling machines and devices: Z99.89

## 2018-06-05 HISTORY — DX: Headache: R51

## 2018-06-05 HISTORY — DX: Low back pain, unspecified: M54.50

## 2018-06-05 HISTORY — DX: Headache, unspecified: R51.9

## 2018-06-05 HISTORY — DX: Obstructive sleep apnea (adult) (pediatric): G47.33

## 2018-06-05 LAB — CBC
HCT: 42.8 % (ref 36.0–46.0)
Hemoglobin: 13.5 g/dL (ref 12.0–15.0)
MCH: 26.7 pg (ref 26.0–34.0)
MCHC: 31.5 g/dL (ref 30.0–36.0)
MCV: 84.6 fL (ref 78.0–100.0)
Platelets: 300 10*3/uL (ref 150–400)
RBC: 5.06 MIL/uL (ref 3.87–5.11)
RDW: 13.2 % (ref 11.5–15.5)
WBC: 7.3 10*3/uL (ref 4.0–10.5)

## 2018-06-05 LAB — BASIC METABOLIC PANEL
Anion gap: 11 (ref 5–15)
BUN: 13 mg/dL (ref 6–20)
CO2: 24 mmol/L (ref 22–32)
Calcium: 9.5 mg/dL (ref 8.9–10.3)
Chloride: 104 mmol/L (ref 98–111)
Creatinine, Ser: 0.6 mg/dL (ref 0.44–1.00)
GFR calc Af Amer: >60 mL/min (ref >60)
GFR calc non Af Amer: >60 mL/min (ref >60)
Glucose, Bld: 93 mg/dL (ref 70–99)
Potassium: 4.4 mmol/L (ref 3.5–5.1)
Sodium: 139 mmol/L (ref 135–145)

## 2018-06-05 LAB — TROPONIN I
Troponin I: <0.03 ng/mL (ref <0.03)
Troponin I: <0.03 ng/mL (ref <0.03)

## 2018-06-05 LAB — HCG, QUANTITATIVE, PREGNANCY: hCG, Beta Chain, Quant, S: <1 m[IU]/mL (ref <5)

## 2018-06-05 LAB — LIPASE, BLOOD: Lipase: 22 U/L (ref 11–51)

## 2018-06-05 MED ORDER — IOPAMIDOL (ISOVUE-370) INJECTION 76%
INTRAVENOUS | Status: AC
  Filled 2018-06-05: qty 100

## 2018-06-05 MED ORDER — ACETAMINOPHEN 500 MG PO TABS
1000.0000 mg | ORAL_TABLET | Freq: Once | ORAL | Status: AC
  Administered 2018-06-05: 1000 mg via ORAL
  Filled 2018-06-05: qty 2

## 2018-06-05 MED ORDER — ACETAMINOPHEN 325 MG PO TABS
650.0000 mg | ORAL_TABLET | Freq: Four times a day (QID) | ORAL | Status: DC | PRN
  Administered 2018-06-06 – 2018-06-09 (×9): 650 mg via ORAL
  Filled 2018-06-05 (×9): qty 2

## 2018-06-05 MED ORDER — IOPAMIDOL (ISOVUE-370) INJECTION 76%
100.0000 mL | Freq: Once | INTRAVENOUS | Status: AC | PRN
  Administered 2018-06-05: 100 mL via INTRAVENOUS

## 2018-06-05 MED ORDER — SODIUM CHLORIDE 0.9 % IV BOLUS
1000.0000 mL | Freq: Once | INTRAVENOUS | Status: AC
  Administered 2018-06-05: 1000 mL via INTRAVENOUS

## 2018-06-05 MED ORDER — MORPHINE SULFATE (PF) 4 MG/ML IV SOLN: 2.0000 mg | INTRAVENOUS | Status: DC | PRN

## 2018-06-05 MED ORDER — NITROGLYCERIN 0.4 MG SL SUBL
0.4000 mg | SUBLINGUAL_TABLET | SUBLINGUAL | Status: AC | PRN
  Administered 2018-06-05 (×3): 0.4 mg via SUBLINGUAL
  Filled 2018-06-05: qty 1

## 2018-06-05 MED ORDER — HYDROMORPHONE HCL 1 MG/ML IJ SOLN
1.0000 mg | INTRAMUSCULAR | Status: DC | PRN
Start: 2018-06-05 — End: 2018-06-07
  Administered 2018-06-05 – 2018-06-06 (×5): 1 mg via INTRAVENOUS
  Filled 2018-06-05 (×5): qty 1

## 2018-06-05 MED ORDER — ZOLPIDEM TARTRATE 5 MG PO TABS
5.0000 mg | ORAL_TABLET | Freq: Every evening | ORAL | Status: DC | PRN
  Administered 2018-06-06 – 2018-06-07 (×3): 5 mg via ORAL
  Filled 2018-06-05 (×3): qty 1

## 2018-06-05 MED ORDER — FENTANYL CITRATE (PF) 100 MCG/2ML IJ SOLN
100.0000 ug | Freq: Once | INTRAMUSCULAR | Status: AC
  Administered 2018-06-05: 100 ug via INTRAVENOUS
  Filled 2018-06-05: qty 2

## 2018-06-05 MED ORDER — CYCLOBENZAPRINE HCL 10 MG PO TABS
5.0000 mg | ORAL_TABLET | Freq: Three times a day (TID) | ORAL | Status: DC | PRN
Start: 2018-06-05 — End: 2018-06-10

## 2018-06-05 MED ORDER — ONDANSETRON HCL 4 MG/2ML IJ SOLN
4.0000 mg | Freq: Four times a day (QID) | INTRAMUSCULAR | Status: DC | PRN
  Administered 2018-06-06 – 2018-06-07 (×5): 4 mg via INTRAVENOUS
  Filled 2018-06-05 (×5): qty 2

## 2018-06-05 MED ORDER — POLYVINYL ALCOHOL 1.4 % OP SOLN: 1.0000 [drp] | OPHTHALMIC | Status: DC | PRN

## 2018-06-05 MED ORDER — POLYETHYLENE GLYCOL 3350 17 G PO PACK
17.0000 g | PACK | Freq: Every day | ORAL | Status: DC | PRN
Start: 2018-06-05 — End: 2018-06-08
  Administered 2018-06-08: 17 g via ORAL
  Filled 2018-06-05: qty 1

## 2018-06-05 MED ORDER — ASPIRIN EC 325 MG PO TBEC
325.0000 mg | DELAYED_RELEASE_TABLET | Freq: Every day | ORAL | Status: DC
  Administered 2018-06-06 – 2018-06-10 (×5): 325 mg via ORAL
  Filled 2018-06-05 (×6): qty 1

## 2018-06-05 MED ORDER — HYDROCHLOROTHIAZIDE 25 MG PO TABS
25.0000 mg | ORAL_TABLET | Freq: Every day | ORAL | Status: DC
  Administered 2018-06-06: 25 mg via ORAL
  Filled 2018-06-05: qty 1

## 2018-06-05 MED ORDER — ALPRAZOLAM 0.25 MG PO TABS
0.2500 mg | ORAL_TABLET | Freq: Two times a day (BID) | ORAL | Status: DC | PRN
  Administered 2018-06-06 – 2018-06-07 (×3): 0.25 mg via ORAL
  Filled 2018-06-05 (×3): qty 1

## 2018-06-05 MED ORDER — NADOLOL 80 MG PO TABS
80.0000 mg | ORAL_TABLET | Freq: Every day | ORAL | Status: DC
  Administered 2018-06-06: 80 mg via ORAL
  Filled 2018-06-05: qty 1

## 2018-06-05 MED ORDER — IRBESARTAN 300 MG PO TABS
300.0000 mg | ORAL_TABLET | Freq: Every day | ORAL | Status: DC
  Administered 2018-06-06 – 2018-06-10 (×5): 300 mg via ORAL
  Filled 2018-06-05 (×6): qty 1

## 2018-06-05 MED ORDER — AMLODIPINE BESYLATE 10 MG PO TABS
10.0000 mg | ORAL_TABLET | Freq: Every day | ORAL | Status: DC
  Administered 2018-06-06 – 2018-06-10 (×5): 10 mg via ORAL
  Filled 2018-06-05 (×5): qty 1

## 2018-06-05 MED ORDER — SUMATRIPTAN SUCCINATE 50 MG PO TABS: 50.0000 mg | ORAL_TABLET | ORAL | Status: DC | PRN

## 2018-06-05 MED ORDER — LORAZEPAM 2 MG/ML IJ SOLN: 1.0000 mg | Freq: Once | INTRAMUSCULAR | Status: DC

## 2018-06-05 MED ORDER — HYDRALAZINE HCL 20 MG/ML IJ SOLN
5.0000 mg | INTRAMUSCULAR | Status: DC | PRN
  Administered 2018-06-05: 5 mg via INTRAVENOUS
  Filled 2018-06-05: qty 1

## 2018-06-05 MED ORDER — NITROGLYCERIN IN D5W 200-5 MCG/ML-% IV SOLN
0.0000 ug/min | Freq: Once | INTRAVENOUS | Status: AC
  Administered 2018-06-06: 30 ug/min via INTRAVENOUS

## 2018-06-05 MED ORDER — PANTOPRAZOLE SODIUM 40 MG PO TBEC: 40.0000 mg | DELAYED_RELEASE_TABLET | Freq: Every day | ORAL | Status: DC | PRN

## 2018-06-05 MED ORDER — TRAMADOL HCL 50 MG PO TABS
25.0000 mg | ORAL_TABLET | Freq: Four times a day (QID) | ORAL | Status: DC | PRN
  Administered 2018-06-07 – 2018-06-09 (×5): 50 mg via ORAL
  Filled 2018-06-05 (×5): qty 1

## 2018-06-05 MED ORDER — VITAMIN D 1000 UNITS PO TABS
2000.0000 [IU] | ORAL_TABLET | Freq: Every day | ORAL | Status: DC
Start: 2018-06-05 — End: 2018-06-10
  Administered 2018-06-06 – 2018-06-10 (×5): 2000 [IU] via ORAL
  Filled 2018-06-05 (×6): qty 2

## 2018-06-05 MED ORDER — ADULT MULTIVITAMIN W/MINERALS CH
1.0000 | ORAL_TABLET | Freq: Every day | ORAL | Status: DC
Start: 2018-06-05 — End: 2018-06-10
  Administered 2018-06-06 – 2018-06-10 (×5): 1 via ORAL
  Filled 2018-06-05 (×6): qty 1

## 2018-06-05 MED ORDER — ENOXAPARIN SODIUM 40 MG/0.4ML ~~LOC~~ SOLN
40.0000 mg | Freq: Every day | SUBCUTANEOUS | Status: DC
  Administered 2018-06-06 – 2018-06-09 (×5): 40 mg via SUBCUTANEOUS
  Filled 2018-06-05 (×5): qty 0.4

## 2018-06-05 MED ORDER — NITROGLYCERIN IN D5W 200-5 MCG/ML-% IV SOLN
0.0000 ug/min | Freq: Once | INTRAVENOUS | Status: AC
  Administered 2018-06-05: 5 ug/min via INTRAVENOUS
  Filled 2018-06-05: qty 250

## 2018-06-05 NOTE — Telephone Encounter (Signed)
Pt. Reports started having palpitations yesterday. "I can feel my heart pounding.I didn't sleep well because of it." Dr. Jenny Reichmann is increasing my BP medication, but I haven't been able to pick it up yet." Reports "very mild chest tightness." Appointment made for today.  Reason for Disposition . [1] Palpitations AND [2] no improvement after following Care Advice  Answer Assessment - Initial Assessment Questions 1. DESCRIPTION: "Please describe your heart rate or heart beat that you are having" (e.g., fast/slow, regular/irregular, skipped or extra beats, "palpitations")     Palpitations 2. ONSET: "When did it start?" (Minutes, hours or days)      Yesterday 3. DURATION: "How long does it last" (e.g., seconds, minutes, hours)     Constant 4. PATTERN "Does it come and go, or has it been constant since it started?"  "Does it get worse with exertion?"   "Are you feeling it now?"     Constant 5. TAP: "Using your hand, can you tap out what you are feeling on a chair or table in front of you, so that I can hear?" (Note: not all patients can do this)       No 6. HEART RATE: "Can you tell me your heart rate?" "How many beats in 15 seconds?"  (Note: not all patients can do this)       77 7. RECURRENT SYMPTOM: "Have you ever had this before?" If so, ask: "When was the last time?" and "What happened that time?"      Yes - was in the hospital 8. CAUSE: "What do you think is causing the palpitations?"     Unsure 9. CARDIAC HISTORY: "Do you have any history of heart disease?" (e.g., heart attack, angina, bypass surgery, angioplasty, arrhythmia)      HTN 10. OTHER SYMPTOMS: "Do you have any other symptoms?" (e.g., dizziness, chest pain, sweating, difficulty breathing)       Feels short of breath 11. PREGNANCY: "Is there any chance you are pregnant?" "When was your last menstrual period?"       No  Protocols used: HEART RATE AND HEARTBEAT QUESTIONS-A-AH

## 2018-06-05 NOTE — ED Triage Notes (Signed)
Pt states that she started having CP yesterday, c/o NV.

## 2018-06-05 NOTE — Progress Notes (Signed)
Chelsea Thompson is a 47 y.o. female with the following history as recorded in Epic[employer]:  Patient Active Problem List   Diagnosis Date Noted  . LLQ pain 05/21/2018  . Chronic neck and back pain 05/21/2018  . Headache 04/09/2018  . Cervical radiculopathy 01/18/2018  . Acute upper respiratory infection 01/04/2018  . Left shoulder pain 01/04/2018  . Left low back pain 01/04/2018  . Insomnia 12/26/2017  . Hypokalemia 12/26/2017  . Vitamin D deficiency 12/26/2017  . Bilateral hearing loss 12/26/2017  . Cough 04/08/2016  . GERD (gastroesophageal reflux disease) 04/08/2016  . Food allergy 12/29/2014  . OSA on CPAP 12/09/2014  . Severe obesity (BMI >= 40) ([place]) 12/09/2014  . Nocturia more than twice per night 10/08/2014  . Obesity hypoventilation syndrome ([place]) 10/08/2014  . Angioedema 08/29/2014  . Neck pain 07/23/2013  . Goiter 07/23/2013  . Right shoulder pain 10/06/2012  . Fatigue 10/02/2012  . Wrist pain, right 02/27/2012  . Nausea 02/27/2012  . Anxiety 02/27/2012  . Impaired glucose tolerance 10/04/2011  . Encounter for well adult exam with abnormal findings 10/04/2011  . Hyperlipidemia 02/08/2011  . Depression 02/27/2010  . ALLERGIC RHINITIS 02/27/2010  . HYPOTHYROIDISM 12/28/2008  . Essential hypertension 12/28/2008  . DYSPNEA 09/23/2008  . PITUITARY ADENOMA 09/22/2008  . OBESITY 09/22/2008    No current facility-administered medications for this visit.    Current Outpatient Medications  Medication Sig Dispense Refill  . amLODipine (NORVASC) 5 MG tablet Take 1 tablet (5 mg total) by mouth daily. 90 tablet 3  . celecoxib (CELEBREX) 200 MG capsule Take 1 capsule (200 mg total) by mouth 2 (two) times daily as needed. 60 capsule 5  . cholecalciferol (VITAMIN D) 1000 UNITS tablet Take 2,000 Units by mouth daily.    . cyclobenzaprine (FLEXERIL) 5 MG tablet TAKE 1 TABLET BY MOUTH THREE TIMES A DAY AS NEEDED FOR MUSCLE SPASMS 60 tablet 1  . hydrochlorothiazide (HYDRODIURIL)  25 MG tablet Take 1 tablet (25 mg total) by mouth daily. 90 tablet 3  . levothyroxine (SYNTHROID, LEVOTHROID) 25 MCG tablet TAKE 1 TABLET (25 MCG TOTAL) BY MOUTH DAILY BEFORE BREAKFAST. 90 tablet 3  . Multiple Vitamin (MULTIVITAMIN WITH MINERALS) TABS Take 1 tablet by mouth daily.    . nadolol (CORGARD) 80 MG tablet Take 1 tablet (80 mg total) by mouth daily. 90 tablet 3  . SUMAtriptan (IMITREX) 50 MG tablet Take 1 tablet (50 mg total) by mouth every 2 (two) hours as needed for migraine. May repeat in 2 hours if headache persists or recurs. (Patient not taking: Reported on 06/05/2018) 10 tablet 5  . telmisartan (MICARDIS) 80 MG tablet Take 1 tablet (80 mg total) by mouth daily. 90 tablet 3  . traMADol (ULTRAM) 50 MG tablet Take 1 tablet (50 mg total) by mouth every 6 (six) hours as needed. (Patient taking differently: Take 25-50 mg by mouth every 6 (six) hours as needed for moderate pain or severe pain. ) 30 tablet 1  . Polyethyl Glycol-Propyl Glycol (SYSTANE) 0.4-0.3 % SOLN Place 1 drop into both eyes as needed (dry eyes).    . potassium chloride (K-DUR,KLOR-CON) 10 MEQ tablet Take 10 mEq by mouth daily.    Marland Kitchen zolpidem (AMBIEN) 10 MG tablet Take 10 mg by mouth at bedtime as needed for sleep.     Facility-Administered Medications Ordered in Other Visits  Medication Dose Route Frequency Provider Last Rate Last Dose  . nitroGLYCERIN (NITROSTAT) SL tablet 0.4 mg  0.4 mg Sublingual Q5 min PRN  Sherwood Gambler, MD   0.4 mg at 06/05/18 1635    Allergies: Peanut-containing drug products and Shellfish allergy  Past Medical History:  Diagnosis Date  . Allergic rhinitis   . Anxiety 02/27/2012  . Depression   . GERD (gastroesophageal reflux disease) 04/08/2016  . Glucose intolerance (impaired glucose tolerance)   . HTN (hypertension)   . Hyperlipidemia   . Hypothyroidism    DR [name]  . Impaired glucose tolerance 10/04/2011  . Obesity   . OSA (obstructive sleep apnea) 10/08/2014  . Pituitary adenoma  ([place])   . Shortness of breath     Past Surgical History:  Procedure Laterality Date  . ANTERIOR CERVICAL DECOMP/DISCECTOMY FUSION  11/18/2012   Procedure: ANTERIOR CERVICAL DECOMPRESSION/DISCECTOMY FUSION 1 LEVEL;  Surgeon: Hosie Spangle, MD;  Location: [facility];  Service: Neurosurgery;  Laterality: N/A;  Cervical five-six Anterior cervical decompression/diskectomy/fusion   . BREAST REDUCTION SURGERY    . DILATION AND CURETTAGE OF UTERUS  07/2010   Hysteroscopy NEG w/Dr Mezer    Family History  Problem Relation Age of Onset  . Other Mother 30       MVA   . Hypertension Mother   . Hypertension Sister   . Hypertension Brother   . Cancer Brother        Prostate    Social History   Tobacco Use  . Smoking status: Never Smoker  . Smokeless tobacco: Never Used  Substance Use Topics  . Alcohol use: No    Alcohol/week: 0.0 oz    Subjective:  Patient presents with concerns for 1 day history of chest pain/ palpitations; symptoms started suddenly yesterday afternoon and have persisted; complaining of pain in right arm as well as chest pain and shortness of breath on exertion; no prior history of CAD or MI/ has been working to get blood pressure under control for past few months; her PCP just increased her HCTZ to 25 mg yesterday;  was seen in ER earlier this year with hypertensive urgency; patient states she feels like her heart is "pounding" in her chest- "not a normal sensation for me." Admits she is very stressed and anxious about what is causing her chest pain today; Also mentions persisting LLQ pain; saw her PCP with these symptoms approximately 2 weeks ago; thought to be gas/ GERD; she notes the pain is not improving at all; LMP 5/22 but denies any chance of being pregnant; does have uterine fibroids but no prior history of ovarian cysts; bowel movements have been normal;  Objective:  Vitals:   06/05/18 1340  BP: (!) 148/98  Pulse: 71  Temp: 97.9 F (36.6 C)  TempSrc: Oral   SpO2: 97%  Weight: (!) 318 lb (144.2 kg)  Height: 5\' 4"  (1.626 m)    General: Well developed, well nourished, in no acute distress  Skin : Warm and dry.  Head: Normocephalic and atraumatic  Eyes: Sclera and conjunctiva clear; pupils round and reactive to light; extraocular movements intact  Lungs: Respirations unlabored; clear to auscultation bilaterally without wheeze, rales, rhonchi  CVS exam: normal rate and regular rhythm.  Abdomen: Soft; nontender; nondistended; normoactive bowel sounds; no masses or hepatosplenomegaly  Musculoskeletal: No deformities; no active joint inflammation  Extremities: No edema, cyanosis, clubbing  Vessels: Symmetric bilaterally  Neurologic: Alert and oriented; speech intact; face symmetrical; moves all extremities well; CNII-XII intact without focal deficit   Assessment:  1. Chest pain, unspecified type   2. Palpitation   3. LLQ pain  Plan:  1. Update EKG today- concerning for possible ischemic changes; based on patient's symptoms and risk factors, concern that could be MI; will refer to ER for further evaluation; she is in agreement and defers transport by ambulance; her husband agrees to take her to [facility] hospital; 3. Due to length of time symptoms have been present ( X 3 weeks), will update abdominal/ pelvic CT; follow-up to be determined.  No follow-ups on file.  Orders Placed This Encounter  Procedures  . CT Abdomen Pelvis W Contrast    Standing Status:   Future    Standing Expiration Date:   09/06/2019    Order Specific Question:   If indicated for the ordered procedure, I authorize the administration of contrast media per Radiology protocol    Answer:   Yes    Order Specific Question:   Is patient pregnant?    Answer:   No    Order Specific Question:   Preferred imaging location?    Answer:   [address]    Order Specific Question:   Is Oral Contrast requested for this exam?    Answer:   Yes, Per Radiology protocol    Order  Specific Question:   Radiology Contrast Protocol - do NOT remove file path    Answer:   [email or web address]  . EKG 12-Lead    Requested Prescriptions    No prescriptions requested or ordered in this encounter

## 2018-06-05 NOTE — ED Provider Notes (Signed)
Patient placed in Quick Look pathway, seen and evaluated   Chief Complaint: cp/ palpitations sob  HPI:   Sent by pcp. Onset of sxs began yesterday around 3pm . + heavy , Pressure like Radiated down R arm. + nausea, + reflux, + diaphoresis, No hx of blood clots  ROS: CP  (one)  Physical Exam:   Gen: No distress  Neuro: Awake and Alert  Skin: Warm    Focused Exam: RRR   Initiation of care has begun. The patient has been counseled on the process, plan, and necessity for staying for the completion/evaluation, and the remainder of the medical screening examination    Margarita Mail, PA-C 06/05/18 [place], [place], MD 06/05/18 [phone]

## 2018-06-05 NOTE — ED Provider Notes (Signed)
[facility] Provider Note   CSN: [id number] Arrival date & time: 06/05/18  1450     History   Chief Complaint Chief Complaint  Patient presents with  . Chest Pain    HPI Chelsea Thompson is a 47 y.o. female.  HPI  47 year old female presents with chest pain.  Started yesterday at 3 PM.  Feels like her heart is irregular and fluttering.  She has a hard time describing what exactly the chest pain feels like.  She is also been having shortness of breath, both at rest but worse with walking.  The chest pain also becomes more noticeable with walking.  She is also been having a right-sided headache.  Has been having right arm pain as well as tingling in her right fingers and right toes.  She has chronic back pain although she is having a little bit of increased back pain as well as she is been having 2 weeks of lower abdominal pain on the left side.  Chest pain is about an 8 out of 10 and the headache is about an 8-9 out of 10.  She has had difficult to control blood pressure as an outpatient for about 6 months.  Past Medical History:  Diagnosis Date  . Allergic rhinitis   . Anxiety 02/27/2012  . Depression   . GERD (gastroesophageal reflux disease) 04/08/2016  . Glucose intolerance (impaired glucose tolerance)   . HTN (hypertension)   . Hyperlipidemia   . Hypothyroidism    DR [name]  . Impaired glucose tolerance 10/04/2011  . Obesity   . OSA (obstructive sleep apnea) 10/08/2014  . Pituitary adenoma ([place])   . Shortness of breath     Patient Active Problem List   Diagnosis Date Noted  . Hypertensive urgency 06/05/2018  . Numbness and tingling of right arm 06/05/2018  . LLQ pain 05/21/2018  . Chronic neck and back pain 05/21/2018  . Headache 04/09/2018  . Cervical radiculopathy 01/18/2018  . Acute upper respiratory infection 01/04/2018  . Left shoulder pain 01/04/2018  . Left low back pain 01/04/2018  . Insomnia 12/26/2017  .  Hypokalemia 12/26/2017  . Vitamin D deficiency 12/26/2017  . Bilateral hearing loss 12/26/2017  . Cough 04/08/2016  . GERD (gastroesophageal reflux disease) 04/08/2016  . Food allergy 12/29/2014  . OSA on CPAP 12/09/2014  . Severe obesity (BMI >= 40) ([place]) 12/09/2014  . Nocturia more than twice per night 10/08/2014  . Obesity hypoventilation syndrome ([place]) 10/08/2014  . Angioedema 08/29/2014  . Neck pain 07/23/2013  . Goiter 07/23/2013  . Right shoulder pain 10/06/2012  . Fatigue 10/02/2012  . Chest pain 07/01/2012  . Abdominal pain 04/05/2012  . Wrist pain, right 02/27/2012  . Nausea 02/27/2012  . Anxiety 02/27/2012  . Impaired glucose tolerance 10/04/2011  . Encounter for well adult exam with abnormal findings 10/04/2011  . Hyperlipidemia 02/08/2011  . Depression 02/27/2010  . ALLERGIC RHINITIS 02/27/2010  . Hypothyroidism 12/28/2008  . Essential hypertension 12/28/2008  . DYSPNEA 09/23/2008  . PITUITARY ADENOMA 09/22/2008  . OBESITY 09/22/2008    Past Surgical History:  Procedure Laterality Date  . ANTERIOR CERVICAL DECOMP/DISCECTOMY FUSION  11/18/2012   Procedure: ANTERIOR CERVICAL DECOMPRESSION/DISCECTOMY FUSION 1 LEVEL;  Surgeon: Hosie Spangle, MD;  Location: [facility];  Service: Neurosurgery;  Laterality: N/A;  Cervical five-six Anterior cervical decompression/diskectomy/fusion   . BREAST REDUCTION SURGERY    . DILATION AND CURETTAGE OF UTERUS  07/2010   Hysteroscopy NEG w/Dr Mezer  OB History   None      Home Medications    Prior to Admission medications   Medication Sig Start Date End Date Taking? Authorizing Provider  amLODipine (NORVASC) 5 MG tablet Take 1 tablet (5 mg total) by mouth daily. 04/09/18  Yes Biagio Borg, MD  celecoxib (CELEBREX) 200 MG capsule Take 1 capsule (200 mg total) by mouth 2 (two) times daily as needed. 05/21/18  Yes Biagio Borg, MD  cholecalciferol (VITAMIN D) 1000 UNITS tablet Take 2,000 Units by mouth daily.   Yes  [provider]  cyclobenzaprine (FLEXERIL) 5 MG tablet TAKE 1 TABLET BY MOUTH THREE TIMES A DAY AS NEEDED FOR MUSCLE SPASMS 04/22/18  Yes Biagio Borg, MD  hydrochlorothiazide (HYDRODIURIL) 25 MG tablet Take 1 tablet (25 mg total) by mouth daily. 06/04/18  Yes Biagio Borg, MD  levothyroxine (SYNTHROID, LEVOTHROID) 25 MCG tablet TAKE 1 TABLET (25 MCG TOTAL) BY MOUTH DAILY BEFORE BREAKFAST. 12/26/17  Yes Biagio Borg, MD  Multiple Vitamin (MULTIVITAMIN WITH MINERALS) TABS Take 1 tablet by mouth daily.   Yes [provider]  nadolol (CORGARD) 80 MG tablet Take 1 tablet (80 mg total) by mouth daily. 04/09/18  Yes Biagio Borg, MD  Polyethyl Glycol-Propyl Glycol (SYSTANE) 0.4-0.3 % SOLN Place 1 drop into both eyes as needed (dry eyes).   Yes [provider]  potassium chloride (K-DUR,KLOR-CON) 10 MEQ tablet Take 10 mEq by mouth daily.   Yes [provider]  telmisartan (MICARDIS) 80 MG tablet Take 1 tablet (80 mg total) by mouth daily. 04/24/18  Yes Biagio Borg, MD  traMADol (ULTRAM) 50 MG tablet Take 1 tablet (50 mg total) by mouth every 6 (six) hours as needed. Patient taking differently: Take 25-50 mg by mouth every 6 (six) hours as needed for moderate pain or severe pain.  05/21/18  Yes Biagio Borg, MD  zolpidem (AMBIEN) 10 MG tablet Take 10 mg by mouth at bedtime as needed for sleep.   Yes [provider]  SUMAtriptan (IMITREX) 50 MG tablet Take 1 tablet (50 mg total) by mouth every 2 (two) hours as needed for migraine. May repeat in 2 hours if headache persists or recurs. Patient not taking: Reported on 06/05/2018 04/09/18   Biagio Borg, MD  fexofenadine (ALLEGRA) 180 MG tablet Take 180 mg by mouth daily.    02/27/12  [provider]    Family History Family History  Problem Relation Age of Onset  . Other Mother 70       MVA   . Hypertension Mother   . Hypertension Sister   . Hypertension Brother   . Cancer Brother        Prostate     Social History Social History   Tobacco Use  . Smoking status: Never Smoker  . Smokeless tobacco: Never Used  Substance Use Topics  . Alcohol use: No    Alcohol/week: 0.0 oz  . Drug use: No     Allergies   Peanut-containing drug products and Shellfish allergy   Review of Systems Review of Systems  Respiratory: Positive for shortness of breath.   Cardiovascular: Positive for chest pain and palpitations.  Gastrointestinal: Positive for abdominal pain.  Musculoskeletal: Positive for back pain and neck pain.       Right arm pain  Neurological: Positive for numbness and headaches. Negative for weakness.  All other systems reviewed and are negative.    Physical Exam Updated Vital Signs BP Marland Kitchen)  230/120 (BP Location: Left Arm)   Pulse 88   Temp 97.9 F (36.6 C) (Oral)   Resp 19   Ht 5\' 4"  (1.626 m)   Wt (!) 144.2 kg (318 lb)   LMP 05/01/2018 Comment: pt. states no chance  SpO2 99%   BMI 54.58 kg/m   Physical Exam  Constitutional: She is oriented to person, place, and time. She appears well-developed and well-nourished.  Non-toxic appearance. She does not appear ill. No distress.  obese  HENT:  Head: Normocephalic and atraumatic.  Right Ear: External ear normal.  Left Ear: External ear normal.  Nose: Nose normal.  Eyes: Right eye exhibits no discharge. Left eye exhibits no discharge.  Cardiovascular: Normal rate, regular rhythm and normal heart sounds.  Pulses:      Radial pulses are 2+ on the right side, and 2+ on the left side.       Dorsalis pedis pulses are 2+ on the right side, and 2+ on the left side.  Pulmonary/Chest: Effort normal and breath sounds normal.  Abdominal: Soft. There is tenderness in the left lower quadrant.  Musculoskeletal:       Right lower leg: She exhibits no edema.       Left lower leg: She exhibits no edema.  Neurological: She is alert and oriented to person, place, and time.  CN 3-12 grossly intact. 5/5 strength in all 4  extremities. Slightly decreased sensation to the right fingertips and right toes  Skin: Skin is warm and dry.  Nursing note and vitals reviewed.    ED Treatments / Results  Labs (all labs ordered are listed, but only abnormal results are displayed) Labs Reviewed  BASIC METABOLIC PANEL  CBC  HCG, QUANTITATIVE, PREGNANCY  TROPONIN I  LIPASE, BLOOD  URINALYSIS, ROUTINE W REFLEX MICROSCOPIC  RAPID URINE DRUG SCREEN, HOSP PERFORMED  HEMOGLOBIN A1C  LIPID PANEL  TROPONIN I  TROPONIN I  TROPONIN I  HIV ANTIBODY (ROUTINE TESTING)    EKG EKG Interpretation  Date/Time:  Wednesday June 05 2018 15:04:25 EDT Ventricular Rate:  71 PR Interval:  150 QRS Duration: 84 QT Interval:  386 QTC Calculation: 419 R Axis:   78 Text Interpretation:  Normal sinus rhythm Left ventricular hypertrophy with repolarization abnormality Abnormal ECG tachycardia no longer present compared to Feb 2019 Confirmed by Sherwood Gambler [phone]) on 06/05/2018 3:53:26 PM   Radiology Dg Chest 2 View  Result Date: 06/05/2018 CLINICAL DATA:  Chest pain EXAM: CHEST - 2 VIEW COMPARISON:  02/07/2018 FINDINGS: The heart size and mediastinal contours are within normal limits. Both lungs are clear. The visualized skeletal structures are unremarkable. Postsurgical changes are noted in the cervical spine. IMPRESSION: No active cardiopulmonary disease. Electronically Signed   By: Inez Catalina M.D.   On: 06/05/2018 15:57   Ct Head Wo Contrast  Result Date: 06/05/2018 CLINICAL DATA:  Headache of acute onset. EXAM: CT HEAD WITHOUT CONTRAST TECHNIQUE: Contiguous axial images were obtained from the base of the skull through the vertex without intravenous contrast. COMPARISON:  None. FINDINGS: BRAIN: The ventricles and sulci are normal. No intraparenchymal hemorrhage, mass effect nor midline shift. No acute large vascular territory infarcts. Grey-white matter distinction is maintained. The basal ganglia are unremarkable. No abnormal  extra-axial fluid collections. Basal cisterns are not effaced and midline. Midline fourth ventricle without effacement. Partially empty pituitary sella. The brainstem and cerebellar hemispheres are without acute abnormalities. VASCULAR: No hyperdense vessel sign.  No unexpected calcifications. SKULL/SOFT TISSUES: No skull fracture. No significant soft  tissue swelling. ORBITS/SINUSES: The included ocular globes and orbital contents are normal.The mastoid air cells are clear. The included paranasal sinuses are well-aerated. OTHER: None. IMPRESSION: No acute intracranial abnormality. Partially empty pituitary sella similar in appearance to prior MRI. Electronically Signed   By: Ashley Royalty M.D.   On: 06/05/2018 19:57   Ct Angio Chest/abd/pel For Dissection W And/or Wo Contrast  Result Date: 06/05/2018 CLINICAL DATA:  Chest pain. Shortness of breath. Nausea and vomiting. Evaluate for thoracic aortic dissection. EXAM: CT ANGIOGRAPHY CHEST, ABDOMEN AND PELVIS TECHNIQUE: Multidetector CT imaging through the chest, abdomen and pelvis was performed using the standard protocol during bolus administration of intravenous contrast. Multiplanar reconstructed images and MIPs were obtained and reviewed to evaluate the vascular anatomy. CONTRAST:  100 cc ISOVUE-370 IOPAMIDOL (ISOVUE-370) INJECTION 76% COMPARISON:  None. FINDINGS: CTA CHEST FINDINGS Vascular Findings: Normal caliber of the thoracic aorta. No evidence of thoracic aortic dissection or periaortic stranding on this nongated examination. Reviewed precontrast images is negative for the presence of an intramural hematoma. Borderline cardiomegaly. Small amount of fluid is seen within the pericardial recess. No pericardial effusion. Bovine configuration of the aortic arch. The branch vessels of the aortic arch appear widely patent throughout their imaged course. Although this examination was not tailored for the evaluation the pulmonary arteries, there are no discrete  filling defects within the central pulmonary arterial tree to suggest central pulmonary embolism. Normal caliber of the main pulmonary artery. ------------------------------------------------------------- Thoracic aortic measurements: Sinotubular junction 25 mm as measured in greatest oblique coronal dimension. Proximal ascending aorta 33 mm as measured in greatest oblique axial dimension at the level of the main pulmonary artery. Aortic arch aorta 23 mm as measured in greatest oblique sagittal dimension. Proximal descending thoracic aorta 26 mm as measured in greatest oblique axial dimension at the level of the main pulmonary artery. Distal descending thoracic aorta 21 mm as measured in greatest oblique axial dimension at the level of the diaphragmatic hiatus. Review of the MIP images confirms the above findings. ------------------------------------------------------------- Non-Vascular Findings: Mediastinum/Lymph Nodes: No bulky mediastinal, hilar axillary lymphadenopathy. Lungs/Pleura: No focal airspace opacities. No pleural effusion or pneumothorax. Central pulmonary airways appear widely patent. No discrete pulmonary nodules. Musculoskeletal: No acute or aggressive osseous abnormalities. Post lower cervical ACDF, incompletely evaluated. Regional soft tissues appear normal. Normal appearance of the imaged portions of the thyroid gland. _________________________________________________________ CTA ABDOMEN AND PELVIS FINDINGS VASCULAR Aorta: Normal caliber the abdominal aorta. There is no significant atherosclerotic plaque within the abdominal aorta. No abdominal aortic dissection or periaortic stranding. Celiac: Widely patent.  Conventional branching pattern. SMA: Widely patent.  Conventional branching pattern. Renals: Solitary bilaterally; the bilateral arteries are widely patent without evidence of a hemodynamically significant narrowing. No vessel irregularity to suggest FMD. IMA: Widely patent without  hemodynamically significant narrowing. Inflow: There is a very minimal amount of eccentric calcified plaque involving the bilateral common iliac arteries (image 141, series 6), not resulting in hemodynamically significant stenosis. The bilateral common, external and internal iliac arteries are of normal caliber and widely patent without hemodynamically significant stenosis. Veins: The IVC and pelvic venous system appears widely patent on this arterial phase examination. Review of the MIP images confirms the above findings. NON-VASCULAR Evaluation the abdominal organs is limited to the arterial phase of enhancement. Hepatobiliary: Normal hepatic contour. No discrete hyperenhancing hepatic lesions. Normal appearance of the gallbladder given degree distention. No radiopaque gallstones. No intra extrahepatic bili duct dilatation. No ascites. Pancreas: Normal appearance of the pancreas Spleen: Normal appearance of  the spleen Adrenals/Urinary Tract: There is symmetric enhancement and excretion of the bilateral kidneys. No definite renal stones on this postcontrast examination. No discrete renal lesions. No urinary obstruction or perinephric stranding. Normal appearance of the bilateral adrenal glands. Normal appearance of the urinary bladder given degree distention. Stomach/Bowel: The bowel is normal in course and caliber without wall thickening or evidence of enteric obstruction. Normal appearance of the terminal ileum and appendix. No pneumoperitoneum, pneumatosis or portal venous gas. Lymphatic: No bulky retroperitoneal, mesenteric, pelvic or inguinal lymphadenopathy. Reproductive: The uterus is noted to be retroflexed. No discrete adnexal lesion. No free fluid in the pelvic cul-de-sac. Other: Minimal amount of body wall edema about the bilateral flanks. Musculoskeletal: No acute or aggressive osseous abnormalities. Mild-to-moderate multilevel lumbar spine DDD, worse at L1-L2 with disc space height loss, endplate  irregularity and sclerosis. A bone island is noted within the right ilium. Review of the MIP images confirms the above findings. IMPRESSION: Chest CTA impression: 1. No acute cardiopulmonary disease. Specifically, no evidence of thoracic aortic aneurysm, dissection or periaortic stranding on this nongated examination. No evidence of central pulmonary embolism. 2. Borderline cardiomegaly. Abdomen and pelvic CTA impression: 1. No evidence of abdominal aortic aneurysm. 2. Very minimal amount of calcified atherosclerotic plaque involving the bilateral common iliac arteries, resulting hemodynamically significant stenosis. 3. No acute findings within the abdomen or pelvis. Specifically, no evidence of enteric or urinary obstruction. Normal appearance of the appendix. Electronically Signed   By: Sandi Mariscal M.D.   On: 06/05/2018 20:21    Procedures .Critical Care Performed by: Sherwood Gambler, MD Authorized by: Sherwood Gambler, MD   Critical care provider statement:    Critical care time (minutes):  35   Critical care was necessary to treat or prevent imminent or life-threatening deterioration of the following conditions:  Cardiac failure   Critical care was time spent personally by me on the following activities:  Development of treatment plan with patient or surrogate, discussions with consultants, evaluation of patient's response to treatment, examination of patient, obtaining history from patient or surrogate, ordering and performing treatments and interventions, ordering and review of laboratory studies, ordering and review of radiographic studies, pulse oximetry, re-evaluation of patient's condition and review of old charts   (including critical care time)  Medications Ordered in ED Medications  acetaminophen (TYLENOL) tablet 650 mg (has no administration in time range)  nitroGLYCERIN 50 mg in dextrose 5 % 250 mL (0.2 mg/mL) infusion (0 mcg/min Intravenous Not Given 06/05/18 2156)  amLODipine  (NORVASC) tablet 10 mg (has no administration in time range)  cholecalciferol (VITAMIN D) tablet 2,000 Units (has no administration in time range)  cyclobenzaprine (FLEXERIL) tablet 5 mg (has no administration in time range)  hydrochlorothiazide (HYDRODIURIL) tablet 25 mg (has no administration in time range)  multivitamin with minerals tablet 1 tablet (has no administration in time range)  nadolol (CORGARD) tablet 80 mg (has no administration in time range)  Polyethyl Glycol-Propyl Glycol 0.4-0.3 % SOLN 1 drop (has no administration in time range)  irbesartan (AVAPRO) tablet 300 mg (has no administration in time range)  traMADol (ULTRAM) tablet 25-50 mg (has no administration in time range)  zolpidem (AMBIEN) tablet 5 mg (has no administration in time range)  pantoprazole (PROTONIX) EC tablet 40 mg (has no administration in time range)  polyethylene glycol (MIRALAX / GLYCOLAX) packet 17 g (has no administration in time range)  aspirin EC tablet 325 mg (has no administration in time range)  HYDROmorphone (DILAUDID) injection 1 mg (1  mg Intravenous Given 06/05/18 2243)  ondansetron (ZOFRAN) injection 4 mg (has no administration in time range)  enoxaparin (LOVENOX) injection 40 mg (has no administration in time range)  ALPRAZolam (XANAX) tablet 0.25 mg (has no administration in time range)  hydrALAZINE (APRESOLINE) injection 5 mg (5 mg Intravenous Given 06/05/18 2243)  fentaNYL (SUBLIMAZE) injection 100 mcg (100 mcg Intravenous Given 06/05/18 1635)  nitroGLYCERIN (NITROSTAT) SL tablet 0.4 mg (0.4 mg Sublingual Given 06/05/18 1806)  sodium chloride 0.9 % bolus 1,000 mL (0 mLs Intravenous Stopped 06/05/18 1933)  iopamidol (ISOVUE-370) 76 % injection 100 mL (100 mLs Intravenous Contrast Given 06/05/18 1905)  acetaminophen (TYLENOL) tablet 1,000 mg (1,000 mg Oral Given 06/05/18 2016)  nitroGLYCERIN 50 mg in dextrose 5 % 250 mL (0.2 mg/mL) infusion (15 mcg/min Intravenous Rate/Dose Change 06/05/18 2120)      Initial Impression / Assessment and Plan / ED Course  I have reviewed the triage vital signs and the nursing notes.  Pertinent labs & imaging results that were available during my care of the patient were reviewed by me and considered in my medical decision making (see chart for details).     Patient presents with significant hypertension with her blood pressure around 031 systolic.  Is likely contributing to her headache and chest pain.  ECG without ischemic changes.  However with the chest pain, back pain, abdominal pain, and vague right-sided numbness, CT scan obtained to rule out dissection.  This is negative.  So at head CT given severe headache.  I think all of it is related to the blood pressure.  She did not tolerate the fentanyl well but has had some progression in her blood pressure with the nitroglycerin.  Since it seems to be improving and her symptoms seem to improving I think she needs better blood pressure control.  She will be admitted to the hospitalist service.  Final Clinical Impressions(s) / ED Diagnoses   Final diagnoses:  Nonspecific chest pain  Hypertensive urgency    ED Discharge Orders    None       Sherwood Gambler, MD 06/05/18 [phone]

## 2018-06-05 NOTE — H&P (Signed)
History and Physical    [name] WRU:[id number] DOB: [birth year] DOA: 06/05/2018  Referring MD/NP/PA:   PCP: Biagio Borg, MD   Patient coming from:  The patient is coming from home.  At baseline, pt is independent for most of ADL.   Chief Complaint: chest pain and left lower abdominal pain  HPI: Chelsea Thompson is a 47 y.o. female with medical history significant of hypertension, hyperlipidemia, GERD, hypothyroidism, depression with anxiety, OSA not on CPAP, obesity, pituitary adenoma, who presents with chest pain and left lower abdominal pain.  Patient states that her chest pain started 3 PM yesterday. Her chest pain is located in substernal area and also on the right side of chest, initially 10 out of 10 severity, now 8 out of 10 severity, dull, nonradiating.  It is associated with shortness breath and palpitation, but no cough.  She states that she feels hot and has chills.  Her temperature is normal in ED.  No recent long distance traveling.  Patient states that she has been having left lower quadrant abdominal pain in the past 2 weeks.  It has been constant, sharp, 10 out of 10 severity, nonradiating.  She has some nausea but no vomiting.  She states that she has alternative constipation and diarrhea recently.  Today she is constipated. She is been having right-sided headache. Has been having right arm pain as well as tingling in right arm and her right 2nd and 3rd fingers. She also has mild right arm weakness. No vision change, hearing loss, slurred speech or facial droop. She has had difficult to control blood pressure as an outpatient for about 6 months. She was seen by her PCP yesterday, who increased her HCTZ dose from 12.5 to 25 mg daily, but she has not started taking it yet.  She has increased urinary frequency, which she attributes to HCTZ use.  Denies dysuria or burning on urination.  ED Course: pt was found to have blood pressure 241/91, WBC 7.3, negative troponin, negative  pregnancy test, electrolytes renal function okay, temperature normal, no tachycardia, oxygen saturation 87 to 100% on room air, negative chest x-ray.  Patient is placed on stepdown bed for observation.  CT-head showed no acute intracranial abnormality, and partially empty pituitary sella similar in appearance to prior MRI. CTA of   Chest CTA: is negative for acute issues.  No dissection.    Abdomen and pelvic CTA: 1. No evidence of abdominal aortic aneurysm. 2. Very minimal amount of calcified atherosclerotic plaque involving the bilateral common iliac arteries, resulting hemodynamicallysignificant stenosis. 3. No acute findings within the abdomen or pelvis. Specifically, no evidence of enteric or urinary obstruction. Normal appearance of theappendix.  Review of Systems:   General: feels hot and has chills, no body weight gain, has poor appetite, has fatigue HEENT: no blurry vision, hearing changes or sore throat Respiratory: has dyspnea, no coughing, wheezing CV: has chest pain, palpitations GI: has nausea, abdominal pain,  Constipation, No vomiting,diarrhea, GU: no dysuria, burning on urination, has increased urinary frequency, no hematuria  Ext: no leg edema Neuro: No vision change or hearing loss. Has right arm pain and tinging Skin: no rash, no skin tear. MSK: No muscle spasm, no deformity, no limitation of range of movement in spin Heme: No easy bruising.  Travel history: No recent long distant travel.  Allergy:  Allergies  Allergen Reactions  . Peanut-Containing Drug Products Anaphylaxis  . Shellfish Allergy Anaphylaxis    Past Medical History:  Diagnosis Date  . Allergic  rhinitis   . Anxiety 02/27/2012  . Depression   . GERD (gastroesophageal reflux disease) 04/08/2016  . Glucose intolerance (impaired glucose tolerance)   . HTN (hypertension)   . Hyperlipidemia   . Hypothyroidism    DR [name]  . Impaired glucose tolerance 10/04/2011  . Obesity   . OSA (obstructive  sleep apnea) 10/08/2014  . Pituitary adenoma ([place])   . Shortness of breath     Past Surgical History:  Procedure Laterality Date  . ANTERIOR CERVICAL DECOMP/DISCECTOMY FUSION  11/18/2012   Procedure: ANTERIOR CERVICAL DECOMPRESSION/DISCECTOMY FUSION 1 LEVEL;  Surgeon: Hosie Spangle, MD;  Location: [place] [facility];  Service: Neurosurgery;  Laterality: N/A;  Cervical five-six Anterior cervical decompression/diskectomy/fusion   . BREAST REDUCTION SURGERY    . DILATION AND CURETTAGE OF UTERUS  07/2010   Hysteroscopy NEG w/Dr Mezer    Social History:  reports that she has never smoked. She has never used smokeless tobacco. She reports that she does not drink alcohol or use drugs.  Family History:  Family History  Problem Relation Age of Onset  . Other Mother 41       MVA   . Hypertension Mother   . Hypertension Sister   . Hypertension Brother   . Cancer Brother        Prostate     Prior to Admission medications   Medication Sig Start Date End Date Taking? Authorizing Provider  amLODipine (NORVASC) 5 MG tablet Take 1 tablet (5 mg total) by mouth daily. 04/09/18  Yes Biagio Borg, MD  celecoxib (CELEBREX) 200 MG capsule Take 1 capsule (200 mg total) by mouth 2 (two) times daily as needed. 05/21/18  Yes Biagio Borg, MD  cholecalciferol (VITAMIN D) 1000 UNITS tablet Take 2,000 Units by mouth daily.   Yes [provider]  cyclobenzaprine (FLEXERIL) 5 MG tablet TAKE 1 TABLET BY MOUTH THREE TIMES A DAY AS NEEDED FOR MUSCLE SPASMS 04/22/18  Yes Biagio Borg, MD  hydrochlorothiazide (HYDRODIURIL) 25 MG tablet Take 1 tablet (25 mg total) by mouth daily. 06/04/18  Yes Biagio Borg, MD  levothyroxine (SYNTHROID, LEVOTHROID) 25 MCG tablet TAKE 1 TABLET (25 MCG TOTAL) BY MOUTH DAILY BEFORE BREAKFAST. 12/26/17  Yes Biagio Borg, MD  Multiple Vitamin (MULTIVITAMIN WITH MINERALS) TABS Take 1 tablet by mouth daily.   Yes [provider]  nadolol (CORGARD) 80 MG tablet Take 1 tablet  (80 mg total) by mouth daily. 04/09/18  Yes Biagio Borg, MD  Polyethyl Glycol-Propyl Glycol (SYSTANE) 0.4-0.3 % SOLN Place 1 drop into both eyes as needed (dry eyes).   Yes [provider]  potassium chloride (K-DUR,KLOR-CON) 10 MEQ tablet Take 10 mEq by mouth daily.   Yes [provider]  telmisartan (MICARDIS) 80 MG tablet Take 1 tablet (80 mg total) by mouth daily. 04/24/18  Yes Biagio Borg, MD  traMADol (ULTRAM) 50 MG tablet Take 1 tablet (50 mg total) by mouth every 6 (six) hours as needed. Patient taking differently: Take 25-50 mg by mouth every 6 (six) hours as needed for moderate pain or severe pain.  05/21/18  Yes Biagio Borg, MD  zolpidem (AMBIEN) 10 MG tablet Take 10 mg by mouth at bedtime as needed for sleep.   Yes [provider]  SUMAtriptan (IMITREX) 50 MG tablet Take 1 tablet (50 mg total) by mouth every 2 (two) hours as needed for migraine. May repeat in 2 hours if headache persists or recurs. Patient not taking:  Reported on 06/05/2018 04/09/18   Biagio Borg, MD  fexofenadine (ALLEGRA) 180 MG tablet Take 180 mg by mouth daily.    02/27/12  [provider]    Physical Exam: Vitals:   06/06/18 0430 06/06/18 0445 06/06/18 0500 06/06/18 0539  BP: (!) 175/89 (!) 168/93 (!) 163/93 (!) 197/105  Pulse: 76 72 69 94  Resp: 19 18 20 20   Temp:    98.5 F (36.9 C)  TempSrc:    Oral  SpO2: 94% 93% 96% 96%  Weight:    (!) 144.3 kg (318 lb 3.2 oz)  Height:    5\' 4"  (1.626 m)   General: Not in acute distress HEENT:       Eyes: PERRL, EOMI, no scleral icterus.       ENT: No discharge from the ears and nose, no pharynx injection, no tonsillar enlargement.        Neck: No JVD, no bruit, no mass felt. Heme: No neck lymph node enlargement. Cardiac: S1/S2, RRR, No murmurs, No gallops or rubs. Respiratory: No rales, wheezing, rhonchi or rubs. GI: Soft, nondistended, has LLQ tenderness, no rebound pain, no organomegaly, BS present. GU: No  hematuria Ext: No pitting leg edema bilaterally. 2+DP/PT pulse bilaterally. Musculoskeletal: No joint deformities, No joint redness or warmth, no limitation of ROM in spin. Skin: No rashes.  Neuro: Alert, oriented X3, cranial nerves II-XII grossly intact, moves all extremities normally. Muscle strength 5/5 in all extremities, sensation to light touch intact. Brachial reflex 2+ bilaterally. Negative Babinski's sign. Normal finger to nose test. Psych: Patient is not psychotic, no suicidal or hemocidal ideation.  Labs on Admission: I have personally reviewed following labs and imaging studies  CBC: Recent Labs  Lab 06/05/18 1550  WBC 7.3  HGB 13.5  HCT 42.8  MCV 84.6  PLT 443   Basic Metabolic Panel: Recent Labs  Lab 06/05/18 1550  NA 139  K 4.4  CL 104  CO2 24  GLUCOSE 93  BUN 13  CREATININE 0.60  CALCIUM 9.5   GFR: Estimated Creatinine Clearance: 126.9 mL/min (by C-G formula based on SCr of 0.6 mg/dL). Liver Function Tests: No results for input(s): AST, ALT, ALKPHOS, BILITOT, PROT, ALBUMIN in the last 168 hours. Recent Labs  Lab 06/05/18 2230  LIPASE 22   No results for input(s): AMMONIA in the last 168 hours. Coagulation Profile: No results for input(s): INR, PROTIME in the last 168 hours. Cardiac Enzymes: Recent Labs  Lab 06/05/18 1550 06/05/18 2230 06/06/18 0359  TROPONINI <0.03 <0.03 <0.03   BNP (last 3 results) No results for input(s): PROBNP in the last 8760 hours. HbA1C: Recent Labs    06/06/18 0350  HGBA1C 5.4   CBG: No results for input(s): GLUCAP in the last 168 hours. Lipid Profile: Recent Labs    06/06/18 0359  CHOL 201*  HDL 58  LDLCALC 133*  TRIG 48  CHOLHDL 3.5   Thyroid Function Tests: No results for input(s): TSH, T4TOTAL, FREET4, T3FREE, THYROIDAB in the last 72 hours. Anemia Panel: No results for input(s): VITAMINB12, FOLATE, FERRITIN, TIBC, IRON, RETICCTPCT in the last 72 hours. Urine analysis:    Component Value  Date/Time   COLORURINE YELLOW 05/21/2018 1523   APPEARANCEUR CLEAR 05/21/2018 1523   LABSPEC 1.015 05/21/2018 1523   PHURINE 6.5 05/21/2018 1523   GLUCOSEU NEGATIVE 05/21/2018 1523   HGBUR NEGATIVE 05/21/2018 1523   BILIRUBINUR NEGATIVE 05/21/2018 1523   KETONESUR NEGATIVE 05/21/2018 1523   PROTEINUR NEGATIVE 07/01/2012 1449  UROBILINOGEN 0.2 05/21/2018 1523   NITRITE NEGATIVE 05/21/2018 1523   LEUKOCYTESUR NEGATIVE 05/21/2018 1523   Sepsis Labs: @LABRCNTIP (procalcitonin:4,lacticidven:4) )No results found for this or any previous visit (from the past 240 hour(s)).   Radiological Exams on Admission: Dg Chest 2 View  Result Date: 06/05/2018 CLINICAL DATA:  Chest pain EXAM: CHEST - 2 VIEW COMPARISON:  02/07/2018 FINDINGS: The heart size and mediastinal contours are within normal limits. Both lungs are clear. The visualized skeletal structures are unremarkable. Postsurgical changes are noted in the cervical spine. IMPRESSION: No active cardiopulmonary disease. Electronically Signed   By: Inez Catalina M.D.   On: 06/05/2018 15:57   Ct Head Wo Contrast  Result Date: 06/05/2018 CLINICAL DATA:  Headache of acute onset. EXAM: CT HEAD WITHOUT CONTRAST TECHNIQUE: Contiguous axial images were obtained from the base of the skull through the vertex without intravenous contrast. COMPARISON:  None. FINDINGS: BRAIN: The ventricles and sulci are normal. No intraparenchymal hemorrhage, mass effect nor midline shift. No acute large vascular territory infarcts. Grey-white matter distinction is maintained. The basal ganglia are unremarkable. No abnormal extra-axial fluid collections. Basal cisterns are not effaced and midline. Midline fourth ventricle without effacement. Partially empty pituitary sella. The brainstem and cerebellar hemispheres are without acute abnormalities. VASCULAR: No hyperdense vessel sign.  No unexpected calcifications. SKULL/SOFT TISSUES: No skull fracture. No significant soft tissue  swelling. ORBITS/SINUSES: The included ocular globes and orbital contents are normal.The mastoid air cells are clear. The included paranasal sinuses are well-aerated. OTHER: None. IMPRESSION: No acute intracranial abnormality. Partially empty pituitary sella similar in appearance to prior MRI. Electronically Signed   By: Ashley Royalty M.D.   On: 06/05/2018 19:57   Ct Angio Chest/abd/pel For Dissection W And/or Wo Contrast  Result Date: 06/05/2018 CLINICAL DATA:  Chest pain. Shortness of breath. Nausea and vomiting. Evaluate for thoracic aortic dissection. EXAM: CT ANGIOGRAPHY CHEST, ABDOMEN AND PELVIS TECHNIQUE: Multidetector CT imaging through the chest, abdomen and pelvis was performed using the standard protocol during bolus administration of intravenous contrast. Multiplanar reconstructed images and MIPs were obtained and reviewed to evaluate the vascular anatomy. CONTRAST:  100 cc ISOVUE-370 IOPAMIDOL (ISOVUE-370) INJECTION 76% COMPARISON:  None. FINDINGS: CTA CHEST FINDINGS Vascular Findings: Normal caliber of the thoracic aorta. No evidence of thoracic aortic dissection or periaortic stranding on this nongated examination. Reviewed precontrast images is negative for the presence of an intramural hematoma. Borderline cardiomegaly. Small amount of fluid is seen within the pericardial recess. No pericardial effusion. Bovine configuration of the aortic arch. The branch vessels of the aortic arch appear widely patent throughout their imaged course. Although this examination was not tailored for the evaluation the pulmonary arteries, there are no discrete filling defects within the central pulmonary arterial tree to suggest central pulmonary embolism. Normal caliber of the main pulmonary artery. ------------------------------------------------------------- Thoracic aortic measurements: Sinotubular junction 25 mm as measured in greatest oblique coronal dimension. Proximal ascending aorta 33 mm as measured in  greatest oblique axial dimension at the level of the main pulmonary artery. Aortic arch aorta 23 mm as measured in greatest oblique sagittal dimension. Proximal descending thoracic aorta 26 mm as measured in greatest oblique axial dimension at the level of the main pulmonary artery. Distal descending thoracic aorta 21 mm as measured in greatest oblique axial dimension at the level of the diaphragmatic hiatus. Review of the MIP images confirms the above findings. ------------------------------------------------------------- Non-Vascular Findings: Mediastinum/Lymph Nodes: No bulky mediastinal, hilar axillary lymphadenopathy. Lungs/Pleura: No focal airspace opacities. No pleural effusion or pneumothorax.  Central pulmonary airways appear widely patent. No discrete pulmonary nodules. Musculoskeletal: No acute or aggressive osseous abnormalities. Post lower cervical ACDF, incompletely evaluated. Regional soft tissues appear normal. Normal appearance of the imaged portions of the thyroid gland. _________________________________________________________ CTA ABDOMEN AND PELVIS FINDINGS VASCULAR Aorta: Normal caliber the abdominal aorta. There is no significant atherosclerotic plaque within the abdominal aorta. No abdominal aortic dissection or periaortic stranding. Celiac: Widely patent.  Conventional branching pattern. SMA: Widely patent.  Conventional branching pattern. Renals: Solitary bilaterally; the bilateral arteries are widely patent without evidence of a hemodynamically significant narrowing. No vessel irregularity to suggest FMD. IMA: Widely patent without hemodynamically significant narrowing. Inflow: There is a very minimal amount of eccentric calcified plaque involving the bilateral common iliac arteries (image 141, series 6), not resulting in hemodynamically significant stenosis. The bilateral common, external and internal iliac arteries are of normal caliber and widely patent without hemodynamically significant  stenosis. Veins: The IVC and pelvic venous system appears widely patent on this arterial phase examination. Review of the MIP images confirms the above findings. NON-VASCULAR Evaluation the abdominal organs is limited to the arterial phase of enhancement. Hepatobiliary: Normal hepatic contour. No discrete hyperenhancing hepatic lesions. Normal appearance of the gallbladder given degree distention. No radiopaque gallstones. No intra extrahepatic bili duct dilatation. No ascites. Pancreas: Normal appearance of the pancreas Spleen: Normal appearance of the spleen Adrenals/Urinary Tract: There is symmetric enhancement and excretion of the bilateral kidneys. No definite renal stones on this postcontrast examination. No discrete renal lesions. No urinary obstruction or perinephric stranding. Normal appearance of the bilateral adrenal glands. Normal appearance of the urinary bladder given degree distention. Stomach/Bowel: The bowel is normal in course and caliber without wall thickening or evidence of enteric obstruction. Normal appearance of the terminal ileum and appendix. No pneumoperitoneum, pneumatosis or portal venous gas. Lymphatic: No bulky retroperitoneal, mesenteric, pelvic or inguinal lymphadenopathy. Reproductive: The uterus is noted to be retroflexed. No discrete adnexal lesion. No free fluid in the pelvic cul-de-sac. Other: Minimal amount of body wall edema about the bilateral flanks. Musculoskeletal: No acute or aggressive osseous abnormalities. Mild-to-moderate multilevel lumbar spine DDD, worse at L1-L2 with disc space height loss, endplate irregularity and sclerosis. A bone island is noted within the right ilium. Review of the MIP images confirms the above findings. IMPRESSION: Chest CTA impression: 1. No acute cardiopulmonary disease. Specifically, no evidence of thoracic aortic aneurysm, dissection or periaortic stranding on this nongated examination. No evidence of central pulmonary embolism. 2.  Borderline cardiomegaly. Abdomen and pelvic CTA impression: 1. No evidence of abdominal aortic aneurysm. 2. Very minimal amount of calcified atherosclerotic plaque involving the bilateral common iliac arteries, resulting hemodynamically significant stenosis. 3. No acute findings within the abdomen or pelvis. Specifically, no evidence of enteric or urinary obstruction. Normal appearance of the appendix. Electronically Signed   By: Sandi Mariscal M.D.   On: 06/05/2018 20:21     EKG: Independently reviewed.  Sinus rhythm, QTC 417, T wave inversion in lead III/aVF and V5-V6  Assessment/Plan Principal Problem:   Chest pain Active Problems:   Hypothyroidism   Essential hypertension   Hyperlipidemia   Abdominal pain   Hypertensive urgency   Numbness and tingling of right arm   Chest pain and hypertensive urgency: Blood pressure is elevated at 241/91.  Her chest pain is likely due to demand ischemia secondary to hypertensive urgency, but cannot rule out underlying coronary artery disease.  Initial troponin negative.  EKG showed T wave inversion in lead III/aVF.  -  Will admit to SDU as inpt - Continue Nitroglycerin drip for now, the of goal of bp reduction is by about 25 to 30% in first several hours, at SBP 160 to 180 mmHg. - Increase amlodipine dose from a 5 to 10 mg daily - HCTZ, nadolol, irbesartan - cycle CE q6 x3 and repeat EKG in the am  - prn Nitroglycerin, dilaudid, and aspirin - Risk factor stratification: will check FLP, UDS and A1C  - 2d echo - inpt card consult was requested via Epic  Hypothyroidism: Last TSH was 1/057 on 2/281/7 -Continue home Synthroid  HLD: not taking meds. LCL 190 on 02/16/17 -f/u FLP  LLQ Abdominal pain: Etiology is not clear. CTA of abd/pelvis did not showed acute intraabdominal abnormalities except for very minimal amount of calcified atherosclerotic plaque involving the bilateral common iliac arteries, resulting hemodynamicallysignificant stenosis, not sure  if this is related.  May be due to constipation. -check UA and lipase -Supportive care -MiraLAX  Numbness and tingling of right arm: possibly due to carpal tunnel syndrome. Low suspicion for Stroke since patient has pain in the right arm -try wrist splint    DVT ppx: SQ Lovenox Code Status: Full code Family Communication:     Yes, patient's husband at bed side Disposition Plan:  Anticipate discharge back to previous home environment Consults called:  none Admission status:   SDU/obs  Date of Service 06/06/2018    Ivor Costa [facility] Pager [phone]  If 7PM-7AM, please contact night-coverage [email or web address] Password [facility] 06/06/2018, 7:23 AM

## 2018-06-05 NOTE — Telephone Encounter (Signed)
Left message advising patient of dr Gwynn Burly change in bp medication---he has sent new rx for HCT 25mg  to her pharm, stop original HCT and begin new dosage, can schedule nurse visit to recheck bp again after she's been on new dose for 1-2 weeks---can talk with tamara,RN at [place] office if any further questions

## 2018-06-06 ENCOUNTER — Encounter (HOSPITAL_COMMUNITY): Payer: Self-pay | Admitting: General Practice

## 2018-06-06 ENCOUNTER — Observation Stay (HOSPITAL_BASED_OUTPATIENT_CLINIC_OR_DEPARTMENT_OTHER): Payer: BLUE CROSS/BLUE SHIELD

## 2018-06-06 ENCOUNTER — Observation Stay (HOSPITAL_COMMUNITY): Payer: BLUE CROSS/BLUE SHIELD

## 2018-06-06 DIAGNOSIS — I10 Essential (primary) hypertension: Secondary | ICD-10-CM | POA: Diagnosis not present

## 2018-06-06 DIAGNOSIS — R079 Chest pain, unspecified: Secondary | ICD-10-CM | POA: Diagnosis not present

## 2018-06-06 DIAGNOSIS — I200 Unstable angina: Secondary | ICD-10-CM

## 2018-06-06 DIAGNOSIS — R1084 Generalized abdominal pain: Secondary | ICD-10-CM | POA: Diagnosis not present

## 2018-06-06 LAB — LIPID PANEL
Cholesterol: 201 mg/dL — ABNORMAL HIGH (ref 0–200)
HDL: 58 mg/dL (ref >40)
LDL Cholesterol: 133 mg/dL — ABNORMAL HIGH (ref 0–99)
Total CHOL/HDL Ratio: 3.5 RATIO
Triglycerides: 48 mg/dL (ref <150)
VLDL: 10 mg/dL (ref 0–40)

## 2018-06-06 LAB — RAPID URINE DRUG SCREEN, HOSP PERFORMED
Amphetamines: NOT DETECTED
Benzodiazepines: NOT DETECTED
Cocaine: NOT DETECTED
Opiates: POSITIVE — AB
Tetrahydrocannabinol: NOT DETECTED

## 2018-06-06 LAB — URINALYSIS, ROUTINE W REFLEX MICROSCOPIC
Bacteria, UA: NONE SEEN
Bilirubin Urine: NEGATIVE
Glucose, UA: NEGATIVE mg/dL
Hgb urine dipstick: NEGATIVE
Ketones, ur: 20 mg/dL — AB
Leukocytes, UA: NEGATIVE
Nitrite: NEGATIVE
Protein, ur: 30 mg/dL — AB
Specific Gravity, Urine: 1.042 — ABNORMAL HIGH (ref 1.005–1.030)
pH: 6 (ref 5.0–8.0)

## 2018-06-06 LAB — CBC
HCT: 39.7 % (ref 36.0–46.0)
Hemoglobin: 12.8 g/dL (ref 12.0–15.0)
MCH: 26.7 pg (ref 26.0–34.0)
MCHC: 32.2 g/dL (ref 30.0–36.0)
MCV: 82.9 fL (ref 78.0–100.0)
Platelets: 318 10*3/uL (ref 150–400)
RBC: 4.79 MIL/uL (ref 3.87–5.11)
RDW: 13.3 % (ref 11.5–15.5)
WBC: 9.3 10*3/uL (ref 4.0–10.5)

## 2018-06-06 LAB — BASIC METABOLIC PANEL
Anion gap: 11 (ref 5–15)
BUN: 11 mg/dL (ref 6–20)
CO2: 26 mmol/L (ref 22–32)
Calcium: 9 mg/dL (ref 8.9–10.3)
Chloride: 99 mmol/L (ref 98–111)
Creatinine, Ser: 0.59 mg/dL (ref 0.44–1.00)
GFR calc Af Amer: >60 mL/min (ref >60)
GFR calc non Af Amer: >60 mL/min (ref >60)
Glucose, Bld: 117 mg/dL — ABNORMAL HIGH (ref 70–99)
Potassium: 3.3 mmol/L — ABNORMAL LOW (ref 3.5–5.1)
Sodium: 136 mmol/L (ref 135–145)

## 2018-06-06 LAB — TROPONIN I
Troponin I: <0.03 ng/mL (ref <0.03)
Troponin I: <0.03 ng/mL (ref <0.03)

## 2018-06-06 LAB — ECHOCARDIOGRAM COMPLETE
Height: 64 in
Weight: 5091.2 oz

## 2018-06-06 LAB — HEMOGLOBIN A1C
Hgb A1c MFr Bld: 5.4 % (ref 4.8–5.6)
Mean Plasma Glucose: 108.28 mg/dL

## 2018-06-06 LAB — HIV ANTIBODY (ROUTINE TESTING W REFLEX): HIV Screen 4th Generation wRfx: NONREACTIVE

## 2018-06-06 LAB — MAGNESIUM: Magnesium: 1.7 mg/dL (ref 1.7–2.4)

## 2018-06-06 MED ORDER — MORPHINE SULFATE (PF) 2 MG/ML IV SOLN: 2.0000 mg | Freq: Once | INTRAVENOUS | Status: DC

## 2018-06-06 MED ORDER — FUROSEMIDE 10 MG/ML IJ SOLN
60.0000 mg | Freq: Two times a day (BID) | INTRAMUSCULAR | Status: DC
  Administered 2018-06-06 – 2018-06-10 (×8): 60 mg via INTRAVENOUS
  Filled 2018-06-06 (×9): qty 6

## 2018-06-06 MED ORDER — HYDRALAZINE HCL 20 MG/ML IJ SOLN
5.0000 mg | Freq: Four times a day (QID) | INTRAMUSCULAR | Status: DC
  Administered 2018-06-06 – 2018-06-07 (×5): 5 mg via INTRAVENOUS
  Filled 2018-06-06 (×5): qty 1

## 2018-06-06 MED ORDER — NADOLOL 40 MG PO TABS
40.0000 mg | ORAL_TABLET | Freq: Once | ORAL | Status: AC
  Administered 2018-06-06: 40 mg via ORAL
  Filled 2018-06-06: qty 1

## 2018-06-06 MED ORDER — DIPHENHYDRAMINE HCL 50 MG/ML IJ SOLN
12.5000 mg | Freq: Once | INTRAMUSCULAR | Status: AC
Start: 2018-06-06 — End: 2018-06-06
  Administered 2018-06-06: 12.5 mg via INTRAVENOUS
  Filled 2018-06-06: qty 1

## 2018-06-06 MED ORDER — NITROGLYCERIN IN D5W 200-5 MCG/ML-% IV SOLN
0.0000 ug/min | INTRAVENOUS | Status: DC
  Administered 2018-06-06: 18 ug/min via INTRAVENOUS
  Administered 2018-06-06 – 2018-06-07 (×2): 60 ug/min via INTRAVENOUS
  Filled 2018-06-06 (×2): qty 250

## 2018-06-06 MED ORDER — SUMATRIPTAN SUCCINATE 6 MG/0.5ML ~~LOC~~ SOLN
6.0000 mg | Freq: Once | SUBCUTANEOUS | Status: AC
  Administered 2018-06-06: 6 mg via SUBCUTANEOUS
  Filled 2018-06-06: qty 0.5

## 2018-06-06 MED ORDER — LEVOTHYROXINE SODIUM 25 MCG PO TABS
25.0000 ug | ORAL_TABLET | Freq: Every day | ORAL | Status: DC
  Administered 2018-06-06 – 2018-06-10 (×5): 25 ug via ORAL
  Filled 2018-06-06 (×5): qty 1

## 2018-06-06 MED ORDER — NADOLOL 80 MG PO TABS
120.0000 mg | ORAL_TABLET | Freq: Every day | ORAL | Status: DC
  Administered 2018-06-07 – 2018-06-08 (×2): 120 mg via ORAL
  Filled 2018-06-06 (×2): qty 1

## 2018-06-06 MED ORDER — PROMETHAZINE HCL 25 MG/ML IJ SOLN
25.0000 mg | Freq: Once | INTRAMUSCULAR | Status: AC
  Administered 2018-06-06: 25 mg via INTRAVENOUS
  Filled 2018-06-06: qty 1

## 2018-06-06 NOTE — Progress Notes (Signed)
  Echocardiogram 2D Echocardiogram has been performed.  Korianna Washer T Christ Fullenwider 06/06/2018, 11:35 AM

## 2018-06-06 NOTE — Progress Notes (Signed)
PROGRESS NOTE    Chelsea Thompson  ZOX:[id number] DOB: [birth year] DOA: 06/05/2018 PCP: Biagio Borg, MD   Brief Narrative:  47 y.o. BF PMHx Depression with Anxiety,HTN, HLD, GERD, Hypothyroidism,  OSA/OHS not on CPAP, obesity, Pituitary Adenoma,   Presents with chest pain and left lower abdominal pain. Patient states that her chest pain started 3 PM yesterday. Her chest pain is located in substernal area and also on the right side of chest, initially 10 out of 10 severity, now 8 out of 10 severity, dull, nonradiating.  It is associated with shortness breath and palpitation, but no cough.  She states that she feels hot and has chills.  Her temperature is normal in ED.  No recent long distance traveling.  Patient states that she has been having left lower quadrant abdominal pain in the past 2 weeks.  It has been constant, sharp, 10 out of 10 severity, nonradiating.  She has some nausea but no vomiting.  She states that she has alternative constipation and diarrhea recently.  Today she is constipated. She is been having right-sided headache. Has been having right arm pain as well as tingling in right arm and her right 2nd and 3rd fingers. She also has mild right arm weakness. No vision change, hearing loss, slurred speech or facial droop. She has had difficult to control blood pressure as an outpatient for about 6 months. She was seen by her PCP yesterday, who increased her HCTZ dose from 12.5 to 25 mg daily, but she has not started taking it yet.  She has increased urinary frequency, which she attributes to HCTZ use.  Denies dysuria or burning on urination.   ED Course: pt was found to have blood pressure 241/91, WBC 7.3, negative troponin, negative pregnancy test, electrolytes renal function okay, temperature normal, no tachycardia, oxygen saturation 87 to 100% on room air, negative chest x-ray.  Patient is placed on stepdown bed for observation.        Subjective: 6/27 A/O x4, negative CP,  negative S OB, negative abdominal pain.  Positive continued headache but improved.  States has been taking her medication as prescribed.  Since December PCP has been making frequent adjustments to medications.  Has been returning to PCP weekly for BP checks.  Has never been educated concerning proper nutrition for hypertensive.  States PCP has been letting BP run 170s to 180s    Assessment & Plan:   Principal Problem:   Chest pain Active Problems:   Hypothyroidism   Essential hypertension   Hyperlipidemia   Abdominal pain   Hypertensive urgency   Numbness and tingling of right arm  Chest pain/HTN urgency -On admission BP 241/91 - Chest pain most likely secondary to HTN urgency - Trend troponins.  R/O ACS vs MI - On admission EKG showed T wave inversion lead III, aVF -Cardiology consulted -Amlodipine 10 mg - Lasix IV 60 mg BID -DC HCTZ -Hydralazine IV 5 mg QID -Irbesartan 300 mg daily - 6/27 increase nadolol 120 mg daily: 40 mg x 1 dose for today. -Morphine IV 2 mg x 1 -Nitroglycerin drip -SBP goal 150-160 over next 24 hours.  (approximately 10% reduction in SBP) -Echocardiogram pending - Stress test pending per cardiology - Nutrition consult for heart healthy diet teaching.  Hypothyroidism - TSH pending - Synthroid 25 mcg daily  HLD - Lipid panel pending   LLQ abdominal pain -CTA of abd/pelvis did not showed acute intraabdominal abnormalities except for very minimal amount of calcified atherosclerotic plaque involving the  bilateral common iliac arteries, resulting hemodynamicallysignificant stenosis, not sure if this is related.   -UA pending -Lipase pending -Currently patient asymptomatic -check UA and lipase    Numbness and tingling of right arm:  possibly due to carpal tunnel syndrome. Low suspicion for Stroke since patient has pain in the right arm -try wrist splint    DVT prophylaxis: Lovenox Code Status: Full Family Communication: Daughter at bedside  for discussion of plan of care Disposition Plan: TBD   Consultants:  Cardiology     Procedures/Significant Events:  CT-head showed no acute intracranial abnormality, and partially empty pituitary sella similar in appearance to prior MRI. CTA of    Chest CTA: is negative for acute issues.  No dissection.     Abdomen and pelvic CTA: 1. No evidence of abdominal aortic aneurysm. 2. Very minimal amount of calcified atherosclerotic plaque involving the bilateral common iliac arteries, resulting hemodynamicallysignificant stenosis. 3. No acute findings within the abdomen or pelvis. Specifically, no evidence of enteric or urinary obstruction. Normal appearance of theappendix.    I have personally reviewed and interpreted all radiology studies and my findings are as above.  VENTILATOR SETTINGS:    Cultures   Antimicrobials:    Devices    LINES / TUBES:      Continuous Infusions:   Objective: Vitals:   06/06/18 0430 06/06/18 0445 06/06/18 0500 06/06/18 0539  BP: (!) 175/89 (!) 168/93 (!) 163/93 (!) 197/105  Pulse: 76 72 69 94  Resp: 19 18 20 20   Temp:    98.5 F (36.9 C)  TempSrc:    Oral  SpO2: 94% 93% 96% 96%  Weight:    (!) 318 lb 3.2 oz (144.3 kg)  Height:    5\' 4"  (1.626 m)    Intake/Output Summary (Last 24 hours) at 06/06/2018 9622 Last data filed at 06/06/2018 0407 Gross per 24 hour  Intake 1056.79 ml  Output -  Net 1056.79 ml   Filed Weights   06/05/18 1502 06/06/18 0539  Weight: (!) 318 lb (144.2 kg) (!) 318 lb 3.2 oz (144.3 kg)    Examination:  General: A/O x4, No acute respiratory distress Neck:  Negative scars, masses, torticollis, lymphadenopathy, JVD Lungs: Clear to auscultation bilaterally without wheezes or crackles Cardiovascular: Regular rate and rhythm without murmur gallop or rub normal S1 and S2 Abdomen: MORBIDLY OBESE, negative abdominal pain, nondistended, positive soft, bowel sounds, no rebound, no ascites, no appreciable  mass Extremities: No significant cyanosis, clubbing.  Bilateral lower extremity edema 1-2+, Skin: Negative rashes, lesions, ulcers Psychiatric:  Negative depression, negative anxiety, negative fatigue, negative mania  Central nervous system:  Cranial nerves II through XII intact, tongue/uvula midline, all extremities muscle strength 5/5, sensation intact throughout,  negative dysarthria, negative expressive aphasia, negative receptive aphasia.  .     Data Reviewed: Care during the described time interval was provided by me .  I have reviewed this patient's available data, including medical history, events of note, physical examination, and all test results as part of my evaluation.   CBC: Recent Labs  Lab 06/05/18 1550  WBC 7.3  HGB 13.5  HCT 42.8  MCV 84.6  PLT 297   Basic Metabolic Panel: Recent Labs  Lab 06/05/18 1550  NA 139  K 4.4  CL 104  CO2 24  GLUCOSE 93  BUN 13  CREATININE 0.60  CALCIUM 9.5   GFR: Estimated Creatinine Clearance: 126.9 mL/min (by C-G formula based on SCr of 0.6 mg/dL). Liver Function Tests: No results  for input(s): AST, ALT, ALKPHOS, BILITOT, PROT, ALBUMIN in the last 168 hours. Recent Labs  Lab 06/05/18 2230  LIPASE 22   No results for input(s): AMMONIA in the last 168 hours. Coagulation Profile: No results for input(s): INR, PROTIME in the last 168 hours. Cardiac Enzymes: Recent Labs  Lab 06/05/18 1550 06/05/18 2230 06/06/18 0359  TROPONINI <0.03 <0.03 <0.03   BNP (last 3 results) No results for input(s): PROBNP in the last 8760 hours. HbA1C: Recent Labs    06/06/18 0350  HGBA1C 5.4   CBG: No results for input(s): GLUCAP in the last 168 hours. Lipid Profile: Recent Labs    06/06/18 0359  CHOL 201*  HDL 58  LDLCALC 133*  TRIG 48  CHOLHDL 3.5   Thyroid Function Tests: No results for input(s): TSH, T4TOTAL, FREET4, T3FREE, THYROIDAB in the last 72 hours. Anemia Panel: No results for input(s): VITAMINB12, FOLATE,  FERRITIN, TIBC, IRON, RETICCTPCT in the last 72 hours. Urine analysis:    Component Value Date/Time   COLORURINE YELLOW 05/21/2018 1523   APPEARANCEUR CLEAR 05/21/2018 1523   LABSPEC 1.015 05/21/2018 1523   PHURINE 6.5 05/21/2018 1523   GLUCOSEU NEGATIVE 05/21/2018 1523   HGBUR NEGATIVE 05/21/2018 1523   BILIRUBINUR NEGATIVE 05/21/2018 1523   KETONESUR NEGATIVE 05/21/2018 1523   PROTEINUR NEGATIVE 07/01/2012 1449   UROBILINOGEN 0.2 05/21/2018 1523   NITRITE NEGATIVE 05/21/2018 1523   LEUKOCYTESUR NEGATIVE 05/21/2018 1523   Sepsis Labs: @LABRCNTIP (procalcitonin:4,lacticidven:4)  )No results found for this or any previous visit (from the past 240 hour(s)).       Radiology Studies: Dg Chest 2 View  Result Date: 06/05/2018 CLINICAL DATA:  Chest pain EXAM: CHEST - 2 VIEW COMPARISON:  02/07/2018 FINDINGS: The heart size and mediastinal contours are within normal limits. Both lungs are clear. The visualized skeletal structures are unremarkable. Postsurgical changes are noted in the cervical spine. IMPRESSION: No active cardiopulmonary disease. Electronically Signed   By: Inez Catalina M.D.   On: 06/05/2018 15:57   Ct Head Wo Contrast  Result Date: 06/05/2018 CLINICAL DATA:  Headache of acute onset. EXAM: CT HEAD WITHOUT CONTRAST TECHNIQUE: Contiguous axial images were obtained from the base of the skull through the vertex without intravenous contrast. COMPARISON:  None. FINDINGS: BRAIN: The ventricles and sulci are normal. No intraparenchymal hemorrhage, mass effect nor midline shift. No acute large vascular territory infarcts. Grey-white matter distinction is maintained. The basal ganglia are unremarkable. No abnormal extra-axial fluid collections. Basal cisterns are not effaced and midline. Midline fourth ventricle without effacement. Partially empty pituitary sella. The brainstem and cerebellar hemispheres are without acute abnormalities. VASCULAR: No hyperdense vessel sign.  No  unexpected calcifications. SKULL/SOFT TISSUES: No skull fracture. No significant soft tissue swelling. ORBITS/SINUSES: The included ocular globes and orbital contents are normal.The mastoid air cells are clear. The included paranasal sinuses are well-aerated. OTHER: None. IMPRESSION: No acute intracranial abnormality. Partially empty pituitary sella similar in appearance to prior MRI. Electronically Signed   By: Ashley Royalty M.D.   On: 06/05/2018 19:57   Ct Angio Chest/abd/pel For Dissection W And/or Wo Contrast  Result Date: 06/05/2018 CLINICAL DATA:  Chest pain. Shortness of breath. Nausea and vomiting. Evaluate for thoracic aortic dissection. EXAM: CT ANGIOGRAPHY CHEST, ABDOMEN AND PELVIS TECHNIQUE: Multidetector CT imaging through the chest, abdomen and pelvis was performed using the standard protocol during bolus administration of intravenous contrast. Multiplanar reconstructed images and MIPs were obtained and reviewed to evaluate the vascular anatomy. CONTRAST:  100 cc ISOVUE-370 IOPAMIDOL (ISOVUE-370)  INJECTION 76% COMPARISON:  None. FINDINGS: CTA CHEST FINDINGS Vascular Findings: Normal caliber of the thoracic aorta. No evidence of thoracic aortic dissection or periaortic stranding on this nongated examination. Reviewed precontrast images is negative for the presence of an intramural hematoma. Borderline cardiomegaly. Small amount of fluid is seen within the pericardial recess. No pericardial effusion. Bovine configuration of the aortic arch. The branch vessels of the aortic arch appear widely patent throughout their imaged course. Although this examination was not tailored for the evaluation the pulmonary arteries, there are no discrete filling defects within the central pulmonary arterial tree to suggest central pulmonary embolism. Normal caliber of the main pulmonary artery. ------------------------------------------------------------- Thoracic aortic measurements: Sinotubular junction 25 mm as  measured in greatest oblique coronal dimension. Proximal ascending aorta 33 mm as measured in greatest oblique axial dimension at the level of the main pulmonary artery. Aortic arch aorta 23 mm as measured in greatest oblique sagittal dimension. Proximal descending thoracic aorta 26 mm as measured in greatest oblique axial dimension at the level of the main pulmonary artery. Distal descending thoracic aorta 21 mm as measured in greatest oblique axial dimension at the level of the diaphragmatic hiatus. Review of the MIP images confirms the above findings. ------------------------------------------------------------- Non-Vascular Findings: Mediastinum/Lymph Nodes: No bulky mediastinal, hilar axillary lymphadenopathy. Lungs/Pleura: No focal airspace opacities. No pleural effusion or pneumothorax. Central pulmonary airways appear widely patent. No discrete pulmonary nodules. Musculoskeletal: No acute or aggressive osseous abnormalities. Post lower cervical ACDF, incompletely evaluated. Regional soft tissues appear normal. Normal appearance of the imaged portions of the thyroid gland. _________________________________________________________ CTA ABDOMEN AND PELVIS FINDINGS VASCULAR Aorta: Normal caliber the abdominal aorta. There is no significant atherosclerotic plaque within the abdominal aorta. No abdominal aortic dissection or periaortic stranding. Celiac: Widely patent.  Conventional branching pattern. SMA: Widely patent.  Conventional branching pattern. Renals: Solitary bilaterally; the bilateral arteries are widely patent without evidence of a hemodynamically significant narrowing. No vessel irregularity to suggest FMD. IMA: Widely patent without hemodynamically significant narrowing. Inflow: There is a very minimal amount of eccentric calcified plaque involving the bilateral common iliac arteries (image 141, series 6), not resulting in hemodynamically significant stenosis. The bilateral common, external and  internal iliac arteries are of normal caliber and widely patent without hemodynamically significant stenosis. Veins: The IVC and pelvic venous system appears widely patent on this arterial phase examination. Review of the MIP images confirms the above findings. NON-VASCULAR Evaluation the abdominal organs is limited to the arterial phase of enhancement. Hepatobiliary: Normal hepatic contour. No discrete hyperenhancing hepatic lesions. Normal appearance of the gallbladder given degree distention. No radiopaque gallstones. No intra extrahepatic bili duct dilatation. No ascites. Pancreas: Normal appearance of the pancreas Spleen: Normal appearance of the spleen Adrenals/Urinary Tract: There is symmetric enhancement and excretion of the bilateral kidneys. No definite renal stones on this postcontrast examination. No discrete renal lesions. No urinary obstruction or perinephric stranding. Normal appearance of the bilateral adrenal glands. Normal appearance of the urinary bladder given degree distention. Stomach/Bowel: The bowel is normal in course and caliber without wall thickening or evidence of enteric obstruction. Normal appearance of the terminal ileum and appendix. No pneumoperitoneum, pneumatosis or portal venous gas. Lymphatic: No bulky retroperitoneal, mesenteric, pelvic or inguinal lymphadenopathy. Reproductive: The uterus is noted to be retroflexed. No discrete adnexal lesion. No free fluid in the pelvic cul-de-sac. Other: Minimal amount of body wall edema about the bilateral flanks. Musculoskeletal: No acute or aggressive osseous abnormalities. Mild-to-moderate multilevel lumbar spine DDD, worse at L1-L2 with  disc space height loss, endplate irregularity and sclerosis. A bone island is noted within the right ilium. Review of the MIP images confirms the above findings. IMPRESSION: Chest CTA impression: 1. No acute cardiopulmonary disease. Specifically, no evidence of thoracic aortic aneurysm, dissection or  periaortic stranding on this nongated examination. No evidence of central pulmonary embolism. 2. Borderline cardiomegaly. Abdomen and pelvic CTA impression: 1. No evidence of abdominal aortic aneurysm. 2. Very minimal amount of calcified atherosclerotic plaque involving the bilateral common iliac arteries, resulting hemodynamically significant stenosis. 3. No acute findings within the abdomen or pelvis. Specifically, no evidence of enteric or urinary obstruction. Normal appearance of the appendix. Electronically Signed   By: Sandi Mariscal M.D.   On: 06/05/2018 20:21        Scheduled Meds: . amLODipine  10 mg Oral Daily  . aspirin EC  325 mg Oral Daily  . cholecalciferol  2,000 Units Oral Daily  . enoxaparin (LOVENOX) injection  40 mg Subcutaneous QHS  . hydrochlorothiazide  25 mg Oral Daily  . irbesartan  300 mg Oral Daily  . levothyroxine  25 mcg Oral QAC breakfast  . multivitamin with minerals  1 tablet Oral Daily  . nadolol  80 mg Oral Daily   Continuous Infusions:   LOS: 0 days    Time spent: 40 minutes    WOODS, Geraldo Docker, MD [facility] Pager [phone]   If 7PM-7AM, please contact night-coverage [email or web address] Password [email or web address] 06/06/2018, 9:03 AM

## 2018-06-06 NOTE — Progress Notes (Signed)
Notified MD of itching.

## 2018-06-06 NOTE — Progress Notes (Signed)
Orthopedic Tech Progress Note Patient Details:  Chelsea Thompson [birth year] [id number]  Ortho Devices Type of Ortho Device: Velcro wrist splint Ortho Device/Splint Location: rue Ortho Device/Splint Interventions: Ordered, Application, Adjustment   Post Interventions Patient Tolerated: Well Instructions Provided: Care of device, Adjustment of device   Karolee Stamps 06/06/2018, 6:43 AM

## 2018-06-06 NOTE — Progress Notes (Signed)
Patient admitted from ED. CCMD notified. Vitals taken. Patient oriented to room and unit. Will continue to monitor

## 2018-06-06 NOTE — Plan of Care (Signed)
Care plans reviewed and patient is progressing.  

## 2018-06-06 NOTE — Consult Note (Addendum)
Cardiology Consultation:   Patient ID: Izzabella Besse; [id number]; 25-Jun-1971   Admit date: 06/05/2018 Date of Consult: 06/06/2018  Primary Care Provider: Biagio Borg, MD Primary Cardiologist: Jenkins Rouge, MD  - new  Patient Profile:   Salote Weidmann is a 47 y.o. female with a hx of pretension, hyperlipidemia, GERD, hypothyroidism, depression with anxiety, OSA not on CPAP, obesity, pituitary adenoma who is being seen today for the evaluation of chest pain at the request of Dr. Sherral Hammers.  History of Present Illness:   Ms. Dollinger presented to the [facility] emergency department on 6/25 evaluation of chest pain.  Her chest pain had started at approximately 3 PM the day before, in the substernal area and also on the right side of her chest, initially 10 out of 10 in severity, dull and nonradiating.  It was associated with shortness of breath and palpitations.  She had reported a 2-week history of left lower quadrant abdominal pain that was constant, sharp and nonradiating.  She had some nausea but no vomiting.  She reported a history of alternating constipation and diarrhea recently.  She was constipated on presentation.  Her blood pressures have been running high for the past 6 months and her PCP had increased her hydrochlorothiazide dose from 12.5 to 25 mg daily on the day before presentation but she had not started taking it yet.  In the ED her blood pressure was 241/91.  Electrolytes and renal function were normal.  CTA of the chest showed no evidence of thoracic aortic dissection or periaortic stranding, or evidence of central pulmonary embolism.  There was borderline cardiomegaly.  There was no mention of coronary artery calcification.  An echocardiogram done today showed severe LVH, vigorous systolic function with EF 65-70%, no regional wall motion abnormalities, grade 1 diastolic dysfunction, mildly increased pulmonary artery pressure, PA peak pressure 43 mmHg.  Upon my evaluation Ms.  Stanbery tells me that she is married and lives with her husband, no children.  She does not work.  She was exercising on a treadmill for 30 minutes twice a week until she stopped in May due to back and neck pain.  She takes care of her ADLs and does some light housework and goes shopping with no prior exertional chest pain.  She does note that she has had high blood pressure for many years.  It has been uncontrolled since December 2018 when she had to stop her long time Corzide because it was discontinued.  Since that time she says that she has been on multiple different blood pressure medicines and is being seen very often at her PCP but has been unable to achieve blood pressure control.  She was seen on Tuesday at her PCP office for a BP check at which time her blood pressure was still running high and her hydrochlorothiazide was increased from 12.5 mg to 25 mg.  It was upon leaving that office that she developed chest pain that was initially in the center of her chest and later moved to the right side and down her right arm.  The pain is a constant pressure with intermittent sharp pain which is now present for the last more than 30 minutes.  She says that on the night after the pain started she was up all night with a feeling of a very hard heartbeat and shortness of breath causing her to sit up in a recliner.  She has had no recent peripheral edema.  Ms. Ebony Hail denies any prior cardiac issues or  diagnostic testing.  She also denies diabetes or stroke.  She does not smoke and does not drink alcohol.  She was placed on atorvastatin 20 mg in January 2019 for elevated cholesterol.  Past Medical History:  Diagnosis Date  . Allergic rhinitis   . Anxiety 02/27/2012  . Depression   . GERD (gastroesophageal reflux disease) 04/08/2016  . Glucose intolerance (impaired glucose tolerance)   . HTN (hypertension)   . Hyperlipidemia   . Hypothyroidism    DR [name]  . Impaired glucose tolerance 10/04/2011  .  Obesity   . OSA (obstructive sleep apnea) 10/08/2014  . Pituitary adenoma ([place])   . Shortness of breath     Past Surgical History:  Procedure Laterality Date  . ANTERIOR CERVICAL DECOMP/DISCECTOMY FUSION  11/18/2012   Procedure: ANTERIOR CERVICAL DECOMPRESSION/DISCECTOMY FUSION 1 LEVEL;  Surgeon: Hosie Spangle, MD;  Location: [facility];  Service: Neurosurgery;  Laterality: N/A;  Cervical five-six Anterior cervical decompression/diskectomy/fusion   . BREAST REDUCTION SURGERY    . DILATION AND CURETTAGE OF UTERUS  07/2010   Hysteroscopy NEG w/Dr Mezer     Home Medications:  Prior to Admission medications   Medication Sig Start Date End Date Taking? Authorizing Provider  amLODipine (NORVASC) 5 MG tablet Take 1 tablet (5 mg total) by mouth daily. 04/09/18  Yes Biagio Borg, MD  celecoxib (CELEBREX) 200 MG capsule Take 1 capsule (200 mg total) by mouth 2 (two) times daily as needed. 05/21/18  Yes Biagio Borg, MD  cholecalciferol (VITAMIN D) 1000 UNITS tablet Take 2,000 Units by mouth daily.   Yes [provider]  cyclobenzaprine (FLEXERIL) 5 MG tablet TAKE 1 TABLET BY MOUTH THREE TIMES A DAY AS NEEDED FOR MUSCLE SPASMS 04/22/18  Yes Biagio Borg, MD  hydrochlorothiazide (HYDRODIURIL) 25 MG tablet Take 1 tablet (25 mg total) by mouth daily. 06/04/18  Yes Biagio Borg, MD  levothyroxine (SYNTHROID, LEVOTHROID) 25 MCG tablet TAKE 1 TABLET (25 MCG TOTAL) BY MOUTH DAILY BEFORE BREAKFAST. 12/26/17  Yes Biagio Borg, MD  Multiple Vitamin (MULTIVITAMIN WITH MINERALS) TABS Take 1 tablet by mouth daily.   Yes [provider]  nadolol (CORGARD) 80 MG tablet Take 1 tablet (80 mg total) by mouth daily. 04/09/18  Yes Biagio Borg, MD  Polyethyl Glycol-Propyl Glycol (SYSTANE) 0.4-0.3 % SOLN Place 1 drop into both eyes as needed (dry eyes).   Yes [provider]  potassium chloride (K-DUR,KLOR-CON) 10 MEQ tablet Take 10 mEq by mouth daily.   Yes [provider]    telmisartan (MICARDIS) 80 MG tablet Take 1 tablet (80 mg total) by mouth daily. 04/24/18  Yes Biagio Borg, MD  traMADol (ULTRAM) 50 MG tablet Take 1 tablet (50 mg total) by mouth every 6 (six) hours as needed. Patient taking differently: Take 25-50 mg by mouth every 6 (six) hours as needed for moderate pain or severe pain.  05/21/18  Yes Biagio Borg, MD  zolpidem (AMBIEN) 10 MG tablet Take 10 mg by mouth at bedtime as needed for sleep.   Yes [provider]  SUMAtriptan (IMITREX) 50 MG tablet Take 1 tablet (50 mg total) by mouth every 2 (two) hours as needed for migraine. May repeat in 2 hours if headache persists or recurs. Patient not taking: Reported on 06/05/2018 04/09/18   Biagio Borg, MD  fexofenadine (ALLEGRA) 180 MG tablet Take 180 mg by mouth daily.    02/27/12  [provider]    Inpatient  Medications: Scheduled Meds: . amLODipine  10 mg Oral Daily  . aspirin EC  325 mg Oral Daily  . cholecalciferol  2,000 Units Oral Daily  . enoxaparin (LOVENOX) injection  40 mg Subcutaneous QHS  . hydrochlorothiazide  25 mg Oral Daily  . irbesartan  300 mg Oral Daily  . levothyroxine  25 mcg Oral QAC breakfast  . multivitamin with minerals  1 tablet Oral Daily  . nadolol  80 mg Oral Daily   Continuous Infusions: . nitroGLYCERIN 18 mcg/min (06/06/18 1414)   PRN Meds: acetaminophen, ALPRAZolam, cyclobenzaprine, hydrALAZINE, HYDROmorphone (DILAUDID) injection, ondansetron (ZOFRAN) IV, pantoprazole, polyethylene glycol, polyvinyl alcohol, traMADol, zolpidem  Allergies:    Allergies  Allergen Reactions  . Peanut-Containing Drug Products Anaphylaxis  . Shellfish Allergy Anaphylaxis    Social History:   Social History   Socioeconomic History  . Marital status: Married    Spouse name: Torry  . Number of children: 0  . Years of education: Masters0  . Highest education level: Not on file  Occupational History    Employer: [employer]  Social Needs  .  Financial resource strain: Not on file  . Food insecurity:    Worry: Not on file    Inability: Not on file  . Transportation needs:    Medical: Not on file    Non-medical: Not on file  Tobacco Use  . Smoking status: Never Smoker  . Smokeless tobacco: Never Used  Substance and Sexual Activity  . Alcohol use: No    Alcohol/week: 0.0 oz  . Drug use: No  . Sexual activity: Not on file  Lifestyle  . Physical activity:    Days per week: Not on file    Minutes per session: Not on file  . Stress: Not on file  Relationships  . Social connections:    Talks on phone: Not on file    Gets together: Not on file    Attends religious service: Not on file    Active member of club or organization: Not on file    Attends meetings of clubs or organizations: Not on file    Relationship status: Not on file  . Intimate partner violence:    Fear of current or ex partner: Not on file    Emotionally abused: Not on file    Physically abused: Not on file    Forced sexual activity: Not on file  Other Topics Concern  . Not on file  Social History Narrative   Patient is married Animator) and lives at home with her husband and her mother.   Patient has a Scientist, water quality.   Patient is right-handed.   Patient drinks one cup of soda daily.   Works as [employer]          Family History:    Family History  Problem Relation Age of Onset  . Other Mother 77       MVA   . Hypertension Mother   . Hypertension Sister   . Hypertension Brother   . Cancer Brother        Prostate     ROS:  Please see the history of present illness.   All other ROS reviewed and negative.     Physical Exam/Data:   Vitals:   06/06/18 1300 06/06/18 1310 06/06/18 1320 06/06/18 1322  BP:      Pulse:      Resp: 14 18 19 18   Temp:      TempSrc:  SpO2: 98% 95% 97% 97%  Weight:      Height:        Intake/Output Summary (Last 24 hours) at 06/06/2018 1507 Last data filed at 06/06/2018  1414 Gross per 24 hour  Intake 1306.79 ml  Output -  Net 1306.79 ml   Filed Weights   06/05/18 1502 06/06/18 0539  Weight: (!) 318 lb (144.2 kg) (!) 318 lb 3.2 oz (144.3 kg)   Body mass index is 54.62 kg/m.  General:  Obese female, in no acute distress HEENT: normal Lymph: no adenopathy Neck: no JVD Endocrine:  No thryomegaly Vascular: No carotid bruits; FA pulses 2+ bilaterally without bruits  Cardiac:  normal S1, S2; RRR; no murmur  Lungs:  clear to auscultation bilaterally, no wheezing, rhonchi or rales  Abd: soft, nontender, no hepatomegaly  Ext: no edema Musculoskeletal:  No deformities, BUE and BLE strength normal and equal Skin: warm and dry  Neuro:  CNs 2-12 intact, no focal abnormalities noted Psych:  Normal affect   EKG:  The EKG was personally reviewed and demonstrates:  Sinus rhythm, 84 bpm, Consider right atrial enlargement, LVH with secondary repolarization abnormality Telemetry:  Telemetry was personally reviewed and demonstrates: Sinus rhythm with rates in the 70s-80s  Relevant CV Studies:  Echocardiogram 06/06/2018 Study Conclusions  - Left ventricle: The cavity size was normal. Wall thickness was   increased in a pattern of severe LVH. Systolic function was   vigorous. The estimated ejection fraction was in the range of 65%   to 70%. Wall motion was normal; there were no regional wall   motion abnormalities. Doppler parameters are consistent with   abnormal left ventricular relaxation (grade 1 diastolic   dysfunction). - Mitral valve: Moderately calcified annulus. - Right ventricle: The cavity size was mildly dilated. Wall   thickness was normal. - Tricuspid valve: There was trivial regurgitation. - Pulmonary arteries: Systolic pressure was mildly increased. PA   peak pressure: 43 mm Hg (S).  Echocardiogram 07/02/2012 - Left ventricle: The cavity size was normal. Wall thickness was increased in a pattern of moderate LVH. Systolic function was  vigorous. The estimated ejection fraction was in the range of 65% to 70%. Although no diagnostic regional wall motion abnormality was identified, this possibility cannot be completely excluded on the basis of this study. Doppler parameters are consistent with abnormal left ventricular relaxation (grade 1 diastolic dysfunction). - Right ventricle: The cavity size was mildly dilated. Wall thickness was normal.   Laboratory Data:  Chemistry Recent Labs  Lab 06/05/18 1550 06/06/18 0931  NA 139 136  K 4.4 3.3*  CL 104 99  CO2 24 26  GLUCOSE 93 117*  BUN 13 11  CREATININE 0.60 0.59  CALCIUM 9.5 9.0  GFRNONAA >60 >60  GFRAA >60 >60  ANIONGAP 11 11    No results for input(s): PROT, ALBUMIN, AST, ALT, ALKPHOS, BILITOT in the last 168 hours. Hematology Recent Labs  Lab 06/05/18 1550 06/06/18 0931  WBC 7.3 9.3  RBC 5.06 4.79  HGB 13.5 12.8  HCT 42.8 39.7  MCV 84.6 82.9  MCH 26.7 26.7  MCHC 31.5 32.2  RDW 13.2 13.3  PLT 300 318   Cardiac Enzymes Recent Labs  Lab 06/05/18 1550 06/05/18 2230 06/06/18 0359 06/06/18 0931  TROPONINI <0.03 <0.03 <0.03 <0.03   No results for input(s): TROPIPOC in the last 168 hours.  BNPNo results for input(s): BNP, PROBNP in the last 168 hours.  DDimer No results for input(s): DDIMER in the last  168 hours.  Radiology/Studies:  Dg Chest 2 View  Result Date: 06/05/2018 CLINICAL DATA:  Chest pain EXAM: CHEST - 2 VIEW COMPARISON:  02/07/2018 FINDINGS: The heart size and mediastinal contours are within normal limits. Both lungs are clear. The visualized skeletal structures are unremarkable. Postsurgical changes are noted in the cervical spine. IMPRESSION: No active cardiopulmonary disease. Electronically Signed   By: Inez Catalina M.D.   On: 06/05/2018 15:57   Ct Head Wo Contrast  Result Date: 06/05/2018 CLINICAL DATA:  Headache of acute onset. EXAM: CT HEAD WITHOUT CONTRAST TECHNIQUE: Contiguous axial images were obtained from  the base of the skull through the vertex without intravenous contrast. COMPARISON:  None. FINDINGS: BRAIN: The ventricles and sulci are normal. No intraparenchymal hemorrhage, mass effect nor midline shift. No acute large vascular territory infarcts. Grey-white matter distinction is maintained. The basal ganglia are unremarkable. No abnormal extra-axial fluid collections. Basal cisterns are not effaced and midline. Midline fourth ventricle without effacement. Partially empty pituitary sella. The brainstem and cerebellar hemispheres are without acute abnormalities. VASCULAR: No hyperdense vessel sign.  No unexpected calcifications. SKULL/SOFT TISSUES: No skull fracture. No significant soft tissue swelling. ORBITS/SINUSES: The included ocular globes and orbital contents are normal.The mastoid air cells are clear. The included paranasal sinuses are well-aerated. OTHER: None. IMPRESSION: No acute intracranial abnormality. Partially empty pituitary sella similar in appearance to prior MRI. Electronically Signed   By: Ashley Royalty M.D.   On: 06/05/2018 19:57   Ct Angio Chest/abd/pel For Dissection W And/or Wo Contrast  Result Date: 06/05/2018 CLINICAL DATA:  Chest pain. Shortness of breath. Nausea and vomiting. Evaluate for thoracic aortic dissection. EXAM: CT ANGIOGRAPHY CHEST, ABDOMEN AND PELVIS TECHNIQUE: Multidetector CT imaging through the chest, abdomen and pelvis was performed using the standard protocol during bolus administration of intravenous contrast. Multiplanar reconstructed images and MIPs were obtained and reviewed to evaluate the vascular anatomy. CONTRAST:  100 cc ISOVUE-370 IOPAMIDOL (ISOVUE-370) INJECTION 76% COMPARISON:  None. FINDINGS: CTA CHEST FINDINGS Vascular Findings: Normal caliber of the thoracic aorta. No evidence of thoracic aortic dissection or periaortic stranding on this nongated examination. Reviewed precontrast images is negative for the presence of an intramural hematoma. Borderline  cardiomegaly. Small amount of fluid is seen within the pericardial recess. No pericardial effusion. Bovine configuration of the aortic arch. The branch vessels of the aortic arch appear widely patent throughout their imaged course. Although this examination was not tailored for the evaluation the pulmonary arteries, there are no discrete filling defects within the central pulmonary arterial tree to suggest central pulmonary embolism. Normal caliber of the main pulmonary artery. ------------------------------------------------------------- Thoracic aortic measurements: Sinotubular junction 25 mm as measured in greatest oblique coronal dimension. Proximal ascending aorta 33 mm as measured in greatest oblique axial dimension at the level of the main pulmonary artery. Aortic arch aorta 23 mm as measured in greatest oblique sagittal dimension. Proximal descending thoracic aorta 26 mm as measured in greatest oblique axial dimension at the level of the main pulmonary artery. Distal descending thoracic aorta 21 mm as measured in greatest oblique axial dimension at the level of the diaphragmatic hiatus. Review of the MIP images confirms the above findings. ------------------------------------------------------------- Non-Vascular Findings: Mediastinum/Lymph Nodes: No bulky mediastinal, hilar axillary lymphadenopathy. Lungs/Pleura: No focal airspace opacities. No pleural effusion or pneumothorax. Central pulmonary airways appear widely patent. No discrete pulmonary nodules. Musculoskeletal: No acute or aggressive osseous abnormalities. Post lower cervical ACDF, incompletely evaluated. Regional soft tissues appear normal. Normal appearance of the imaged portions of the  thyroid gland. _________________________________________________________ CTA ABDOMEN AND PELVIS FINDINGS VASCULAR Aorta: Normal caliber the abdominal aorta. There is no significant atherosclerotic plaque within the abdominal aorta. No abdominal aortic dissection  or periaortic stranding. Celiac: Widely patent.  Conventional branching pattern. SMA: Widely patent.  Conventional branching pattern. Renals: Solitary bilaterally; the bilateral arteries are widely patent without evidence of a hemodynamically significant narrowing. No vessel irregularity to suggest FMD. IMA: Widely patent without hemodynamically significant narrowing. Inflow: There is a very minimal amount of eccentric calcified plaque involving the bilateral common iliac arteries (image 141, series 6), not resulting in hemodynamically significant stenosis. The bilateral common, external and internal iliac arteries are of normal caliber and widely patent without hemodynamically significant stenosis. Veins: The IVC and pelvic venous system appears widely patent on this arterial phase examination. Review of the MIP images confirms the above findings. NON-VASCULAR Evaluation the abdominal organs is limited to the arterial phase of enhancement. Hepatobiliary: Normal hepatic contour. No discrete hyperenhancing hepatic lesions. Normal appearance of the gallbladder given degree distention. No radiopaque gallstones. No intra extrahepatic bili duct dilatation. No ascites. Pancreas: Normal appearance of the pancreas Spleen: Normal appearance of the spleen Adrenals/Urinary Tract: There is symmetric enhancement and excretion of the bilateral kidneys. No definite renal stones on this postcontrast examination. No discrete renal lesions. No urinary obstruction or perinephric stranding. Normal appearance of the bilateral adrenal glands. Normal appearance of the urinary bladder given degree distention. Stomach/Bowel: The bowel is normal in course and caliber without wall thickening or evidence of enteric obstruction. Normal appearance of the terminal ileum and appendix. No pneumoperitoneum, pneumatosis or portal venous gas. Lymphatic: No bulky retroperitoneal, mesenteric, pelvic or inguinal lymphadenopathy. Reproductive: The uterus  is noted to be retroflexed. No discrete adnexal lesion. No free fluid in the pelvic cul-de-sac. Other: Minimal amount of body wall edema about the bilateral flanks. Musculoskeletal: No acute or aggressive osseous abnormalities. Mild-to-moderate multilevel lumbar spine DDD, worse at L1-L2 with disc space height loss, endplate irregularity and sclerosis. A bone island is noted within the right ilium. Review of the MIP images confirms the above findings. IMPRESSION: Chest CTA impression: 1. No acute cardiopulmonary disease. Specifically, no evidence of thoracic aortic aneurysm, dissection or periaortic stranding on this nongated examination. No evidence of central pulmonary embolism. 2. Borderline cardiomegaly. Abdomen and pelvic CTA impression: 1. No evidence of abdominal aortic aneurysm. 2. Very minimal amount of calcified atherosclerotic plaque involving the bilateral common iliac arteries, resulting hemodynamically significant stenosis. 3. No acute findings within the abdomen or pelvis. Specifically, no evidence of enteric or urinary obstruction. Normal appearance of the appendix. Electronically Signed   By: Sandi Mariscal M.D.   On: 06/05/2018 20:21    Assessment and Plan:   Atypical Chest pain -CVD risk factors include hypertension, hyperlipidemia, obesity -Central to right sided chest pain radiating to the right arm that is constant pressure with intermittent sharp pains associated with shortness of breath and a hard heartbeat.  Started as she was leaving her primary care office for blood pressure check on Tuesday and her pain is continuing. -She is not very active, but prior to this episode has had no exertional chest discomfort or shortness of breath with her usual daily activities -Echocardiogram shows severe LVH with vigorous LV systolic function, no wall motion abnormality. No coronary calcium noted on CTA chest. No evidence of thoracic aortic dissection or periaortic stranding, or evidence of central  pulmonary embolism. -Troponins have been negative x4. EKG shows LVH but no ischemic changes. -Her chest  pain is possibly related to hypertensive urgency.  She will need better blood pressure control -Low level of suspicion for coronary artery disease. Will check 2 day lexiscan myoview.   Hypertensive heart disease/uncontrolled hypertension -Home medications include amlodipine 5 mg, hydrochlorothiazide recently increased from 12.5 to 25 mg daily for better blood pressure control but has not started taking it yet, nadolol 80 mg daily, and Micardis 80 mg daily -She has long standing hypertension which became uncontrolled in 11/2017 when Corzide was discontinued and she has been unable to find an adequate antihypertensive regimen since that time. -In the hospital she is on amlodipine 10 mg, hydrochlorothiazide 25 mg, irbesartan 300 mg, nadolol 80 mg -Blood pressure was initially 234/109, slightly improved but still markedly elevated.  -She is currently on a nitroglycerin drip and has been added Lasix 60 mg IV twice daily and hydralazine 5 mg IV every 6 hours with additional 5 mg as needed for systolic blood pressure greater than 180 -Will check renal duplex. -Low sodium diet. Pt will need adjustments at home.   Hyperlipidemia -LDL was 190 and 02/2017, down to 133 and 05/2018.  She was started on atorvastatin 20 mg daily in 12/2017.  Obesity -Body mass index is 54.62 kg/m.  Advise wt loss with diet and exercise once BP controlled.   Hypothyroidism -TSH and free T4 within normal range -Continuing on home Synthroid   For questions or updates, please contact [facility] Please consult [email or web address] for contact info under Cardiology/STEMI.   Signed, Daune Perch, NP  06/06/2018 3:07 PM  Patient examined chart reviewed. Morbidly obese black female admitted with HTN urgency and atypical chest pain. Pain resting sharp not typically exertional.  R/O ECG with chronic LVH with strain TTE normal  except for expected LVH no RWMA;s She has had changes to her HCTZ and and amlodipine in last 48 hours and is on iv nitro. She still "seasons" her food. Exam with obesity clear lungs distant heart sounds no obvious renal bruit and trace LE edema.   CT showed no aortic dissection PE, no coronary calcium noted   She doe not warrant invasive heart cath for this pain but non invasive imaging challenging due to size will tentatively plan 2 day myovue to risk stratify. Will let primary service adjust HTN meds since multiple changes already made Will order renal duplex although I don't think hospital does renal doppler for RAS and her body habitus would make anything but looking at ostium of main arteries challenging   Jenkins Rouge

## 2018-06-07 ENCOUNTER — Observation Stay (HOSPITAL_COMMUNITY): Payer: BLUE CROSS/BLUE SHIELD

## 2018-06-07 DIAGNOSIS — Z981 Arthrodesis status: Secondary | ICD-10-CM | POA: Diagnosis not present

## 2018-06-07 DIAGNOSIS — Z79899 Other long term (current) drug therapy: Secondary | ICD-10-CM | POA: Diagnosis not present

## 2018-06-07 DIAGNOSIS — E7849 Other hyperlipidemia: Secondary | ICD-10-CM | POA: Diagnosis not present

## 2018-06-07 DIAGNOSIS — Z79890 Hormone replacement therapy (postmenopausal): Secondary | ICD-10-CM | POA: Diagnosis not present

## 2018-06-07 DIAGNOSIS — Z91013 Allergy to seafood: Secondary | ICD-10-CM | POA: Diagnosis not present

## 2018-06-07 DIAGNOSIS — E039 Hypothyroidism, unspecified: Secondary | ICD-10-CM | POA: Diagnosis present

## 2018-06-07 DIAGNOSIS — K5900 Constipation, unspecified: Secondary | ICD-10-CM | POA: Diagnosis present

## 2018-06-07 DIAGNOSIS — Z91010 Allergy to peanuts: Secondary | ICD-10-CM | POA: Diagnosis not present

## 2018-06-07 DIAGNOSIS — I110 Hypertensive heart disease with heart failure: Secondary | ICD-10-CM | POA: Diagnosis present

## 2018-06-07 DIAGNOSIS — G4733 Obstructive sleep apnea (adult) (pediatric): Secondary | ICD-10-CM | POA: Diagnosis present

## 2018-06-07 DIAGNOSIS — I272 Pulmonary hypertension, unspecified: Secondary | ICD-10-CM | POA: Diagnosis present

## 2018-06-07 DIAGNOSIS — I10 Essential (primary) hypertension: Secondary | ICD-10-CM | POA: Diagnosis not present

## 2018-06-07 DIAGNOSIS — E785 Hyperlipidemia, unspecified: Secondary | ICD-10-CM | POA: Diagnosis present

## 2018-06-07 DIAGNOSIS — R109 Unspecified abdominal pain: Secondary | ICD-10-CM | POA: Diagnosis not present

## 2018-06-07 DIAGNOSIS — I200 Unstable angina: Secondary | ICD-10-CM | POA: Diagnosis not present

## 2018-06-07 DIAGNOSIS — R079 Chest pain, unspecified: Secondary | ICD-10-CM | POA: Diagnosis not present

## 2018-06-07 DIAGNOSIS — R51 Headache: Secondary | ICD-10-CM

## 2018-06-07 DIAGNOSIS — E876 Hypokalemia: Secondary | ICD-10-CM | POA: Diagnosis present

## 2018-06-07 DIAGNOSIS — R1084 Generalized abdominal pain: Secondary | ICD-10-CM | POA: Diagnosis not present

## 2018-06-07 DIAGNOSIS — I160 Hypertensive urgency: Secondary | ICD-10-CM | POA: Diagnosis present

## 2018-06-07 DIAGNOSIS — I5032 Chronic diastolic (congestive) heart failure: Secondary | ICD-10-CM | POA: Diagnosis present

## 2018-06-07 DIAGNOSIS — Z6841 Body Mass Index (BMI) 40.0 and over, adult: Secondary | ICD-10-CM | POA: Diagnosis not present

## 2018-06-07 DIAGNOSIS — G4700 Insomnia, unspecified: Secondary | ICD-10-CM | POA: Diagnosis present

## 2018-06-07 LAB — BASIC METABOLIC PANEL
Anion gap: 9 (ref 5–15)
BUN: 16 mg/dL (ref 6–20)
CO2: 27 mmol/L (ref 22–32)
Calcium: 9.1 mg/dL (ref 8.9–10.3)
Chloride: 100 mmol/L (ref 98–111)
Creatinine, Ser: 0.88 mg/dL (ref 0.44–1.00)
GFR calc Af Amer: >60 mL/min (ref >60)
GFR calc non Af Amer: >60 mL/min (ref >60)
Glucose, Bld: 105 mg/dL — ABNORMAL HIGH (ref 70–99)
Potassium: 3 mmol/L — ABNORMAL LOW (ref 3.5–5.1)
Sodium: 136 mmol/L (ref 135–145)

## 2018-06-07 LAB — LIPID PANEL
Cholesterol: 183 mg/dL (ref 0–200)
HDL: 53 mg/dL (ref >40)
LDL Cholesterol: 118 mg/dL — ABNORMAL HIGH (ref 0–99)
Total CHOL/HDL Ratio: 3.5 RATIO
Triglycerides: 61 mg/dL (ref <150)
VLDL: 12 mg/dL (ref 0–40)

## 2018-06-07 LAB — HEMOGLOBIN A1C
Hgb A1c MFr Bld: 5.4 % (ref 4.8–5.6)
Mean Plasma Glucose: 108.28 mg/dL

## 2018-06-07 LAB — TSH: TSH: 1.144 u[IU]/mL (ref 0.350–4.500)

## 2018-06-07 LAB — MAGNESIUM: Magnesium: 1.9 mg/dL (ref 1.7–2.4)

## 2018-06-07 MED ORDER — REGADENOSON 0.4 MG/5ML IV SOLN
0.4000 mg | Freq: Once | INTRAVENOUS | Status: AC
  Administered 2018-06-07: 0.4 mg via INTRAVENOUS
  Filled 2018-06-07: qty 5

## 2018-06-07 MED ORDER — REGADENOSON 0.4 MG/5ML IV SOLN
INTRAVENOUS | Status: AC
  Administered 2018-06-07: 0.4 mg via INTRAVENOUS
  Filled 2018-06-07: qty 5

## 2018-06-07 MED ORDER — SUMATRIPTAN SUCCINATE 6 MG/0.5ML ~~LOC~~ SOLN
6.0000 mg | Freq: Once | SUBCUTANEOUS | Status: DC
  Filled 2018-06-07: qty 0.5

## 2018-06-07 MED ORDER — SUMATRIPTAN SUCCINATE 6 MG/0.5ML ~~LOC~~ SOLN
6.0000 mg | Freq: Every day | SUBCUTANEOUS | Status: DC | PRN
Start: 2018-06-07 — End: 2018-06-10
  Administered 2018-06-08: 6 mg via SUBCUTANEOUS
  Filled 2018-06-07 (×3): qty 0.5

## 2018-06-07 MED ORDER — POTASSIUM CHLORIDE CRYS ER 20 MEQ PO TBCR
40.0000 meq | EXTENDED_RELEASE_TABLET | Freq: Two times a day (BID) | ORAL | Status: AC
  Administered 2018-06-07 (×2): 40 meq via ORAL
  Filled 2018-06-07 (×2): qty 2

## 2018-06-07 MED ORDER — MORPHINE SULFATE (PF) 2 MG/ML IV SOLN
1.0000 mg | INTRAVENOUS | Status: DC | PRN
  Administered 2018-06-07 – 2018-06-09 (×7): 2 mg via INTRAVENOUS
  Filled 2018-06-07 (×7): qty 1

## 2018-06-07 MED ORDER — ATORVASTATIN CALCIUM 40 MG PO TABS
40.0000 mg | ORAL_TABLET | Freq: Every day | ORAL | Status: DC
  Administered 2018-06-07 – 2018-06-09 (×3): 40 mg via ORAL
  Filled 2018-06-07 (×4): qty 1

## 2018-06-07 MED ORDER — HYDRALAZINE HCL 25 MG PO TABS
25.0000 mg | ORAL_TABLET | Freq: Three times a day (TID) | ORAL | Status: DC
  Administered 2018-06-07 – 2018-06-09 (×5): 25 mg via ORAL
  Filled 2018-06-07 (×6): qty 1

## 2018-06-07 NOTE — Progress Notes (Signed)
Nitro drip to be titrated down and discontinued, per Dr. Sherral Hammers verbal order. Will titrate down and continue to monitor pt.  Ara Kussmaul BSN, RN

## 2018-06-07 NOTE — Progress Notes (Signed)
Pt denies chest pain at this time. BP stable. Nitro drip stopped.   Ara Kussmaul BSN, RN

## 2018-06-07 NOTE — Progress Notes (Signed)
Nutrition Consult/Brief Note  RD consulted for Heart Healthy diet education. Pt was not in her room at time of RD visit.   Lipid Panel     Component Value Date/Time   CHOL 183 06/07/2018 0625   TRIG 61 06/07/2018 0625   HDL 53 06/07/2018 0625   CHOLHDL 3.5 06/07/2018 0625   VLDL 12 06/07/2018 0625   LDLCALC 118 (H) 06/07/2018 0625   RD left "Heart Healthy Nutrition Therapy" handout from the [employer] on pt's tray table.  Body mass index is 54.62 kg/m. Pt meets criteria for Obesity Class III based on current BMI.  Current diet order is NPO. Labs/medications reviewed. No nutrition interventions warranted at this time.   If nutrition issues arise, please re-consult RD.  Arthur Holms, RD, LDN Pager #: [phone] After-Hours Pager #: [phone]

## 2018-06-07 NOTE — Progress Notes (Signed)
Progress Note  Patient Name: Chelsea Thompson Date of Encounter: 06/07/2018  Primary Cardiologist: Jenkins Rouge, MD   Subjective   No complaints no chest pain   Inpatient Medications    Scheduled Meds: . amLODipine  10 mg Oral Daily  . aspirin EC  325 mg Oral Daily  . cholecalciferol  2,000 Units Oral Daily  . enoxaparin (LOVENOX) injection  40 mg Subcutaneous QHS  . furosemide  60 mg Intravenous BID  . hydrALAZINE  5 mg Intravenous Q6H  . irbesartan  300 mg Oral Daily  . levothyroxine  25 mcg Oral QAC breakfast  .  morphine injection  2 mg Intravenous Once  . multivitamin with minerals  1 tablet Oral Daily  . nadolol  120 mg Oral Daily   Continuous Infusions: . nitroGLYCERIN 60 mcg/min (06/07/18 0443)   PRN Meds: acetaminophen, ALPRAZolam, cyclobenzaprine, HYDROmorphone (DILAUDID) injection, ondansetron (ZOFRAN) IV, pantoprazole, polyethylene glycol, polyvinyl alcohol, traMADol, zolpidem   Vital Signs    Vitals:   06/06/18 2200 06/06/18 2300 06/06/18 2350 06/07/18 0414  BP: (!) 115/57 124/63 (!) 116/58 (!) 146/70  Pulse:   87 84  Resp: (!) 23 (!) 24 (!) 22 (!) 21  Temp:   98.1 F (36.7 C) 98.4 F (36.9 C)  TempSrc:   Oral Oral  SpO2: 96%  99% 94%  Weight:      Height:        Intake/Output Summary (Last 24 hours) at 06/07/2018 0800 Last data filed at 06/07/2018 0500 Gross per 24 hour  Intake 712.81 ml  Output 1000 ml  Net -287.19 ml   Filed Weights   06/05/18 1502 06/06/18 0539  Weight: (!) 318 lb (144.2 kg) (!) 318 lb 3.2 oz (144.3 kg)    Telemetry    NSR 06/07/2018  - Personally Reviewed  ECG    NSR LVH with strain  - Personally Reviewed  Physical Exam  Morbidly obese black female  GEN: No acute distress.   Neck: No JVD Cardiac: RRR, SEM murmurs, rubs, or gallops.  Respiratory: Clear to auscultation bilaterally. GI: Soft, nontender, non-distended  MS: No edema; No deformity. Neuro:  Nonfocal  Psych: Normal affect   Labs     Chemistry Recent Labs  Lab 06/05/18 1550 06/06/18 0931  NA 139 136  K 4.4 3.3*  CL 104 99  CO2 24 26  GLUCOSE 93 117*  BUN 13 11  CREATININE 0.60 0.59  CALCIUM 9.5 9.0  GFRNONAA >60 >60  GFRAA >60 >60  ANIONGAP 11 11     Hematology Recent Labs  Lab 06/05/18 1550 06/06/18 0931  WBC 7.3 9.3  RBC 5.06 4.79  HGB 13.5 12.8  HCT 42.8 39.7  MCV 84.6 82.9  MCH 26.7 26.7  MCHC 31.5 32.2  RDW 13.2 13.3  PLT 300 318    Cardiac Enzymes Recent Labs  Lab 06/05/18 1550 06/05/18 2230 06/06/18 0359 06/06/18 0931  TROPONINI <0.03 <0.03 <0.03 <0.03   No results for input(s): TROPIPOC in the last 168 hours.   BNPNo results for input(s): BNP, PROBNP in the last 168 hours.   DDimer No results for input(s): DDIMER in the last 168 hours.   Radiology    Dg Chest 2 View  Result Date: 06/05/2018 CLINICAL DATA:  Chest pain EXAM: CHEST - 2 VIEW COMPARISON:  02/07/2018 FINDINGS: The heart size and mediastinal contours are within normal limits. Both lungs are clear. The visualized skeletal structures are unremarkable. Postsurgical changes are noted in the cervical spine. IMPRESSION: No  active cardiopulmonary disease. Electronically Signed   By: Inez Catalina M.D.   On: 06/05/2018 15:57   Ct Head Wo Contrast  Result Date: 06/05/2018 CLINICAL DATA:  Headache of acute onset. EXAM: CT HEAD WITHOUT CONTRAST TECHNIQUE: Contiguous axial images were obtained from the base of the skull through the vertex without intravenous contrast. COMPARISON:  None. FINDINGS: BRAIN: The ventricles and sulci are normal. No intraparenchymal hemorrhage, mass effect nor midline shift. No acute large vascular territory infarcts. Grey-white matter distinction is maintained. The basal ganglia are unremarkable. No abnormal extra-axial fluid collections. Basal cisterns are not effaced and midline. Midline fourth ventricle without effacement. Partially empty pituitary sella. The brainstem and cerebellar hemispheres are  without acute abnormalities. VASCULAR: No hyperdense vessel sign.  No unexpected calcifications. SKULL/SOFT TISSUES: No skull fracture. No significant soft tissue swelling. ORBITS/SINUSES: The included ocular globes and orbital contents are normal.The mastoid air cells are clear. The included paranasal sinuses are well-aerated. OTHER: None. IMPRESSION: No acute intracranial abnormality. Partially empty pituitary sella similar in appearance to prior MRI. Electronically Signed   By: Ashley Royalty M.D.   On: 06/05/2018 19:57   Ct Angio Chest/abd/pel For Dissection W And/or Wo Contrast  Result Date: 06/05/2018 CLINICAL DATA:  Chest pain. Shortness of breath. Nausea and vomiting. Evaluate for thoracic aortic dissection. EXAM: CT ANGIOGRAPHY CHEST, ABDOMEN AND PELVIS TECHNIQUE: Multidetector CT imaging through the chest, abdomen and pelvis was performed using the standard protocol during bolus administration of intravenous contrast. Multiplanar reconstructed images and MIPs were obtained and reviewed to evaluate the vascular anatomy. CONTRAST:  100 cc ISOVUE-370 IOPAMIDOL (ISOVUE-370) INJECTION 76% COMPARISON:  None. FINDINGS: CTA CHEST FINDINGS Vascular Findings: Normal caliber of the thoracic aorta. No evidence of thoracic aortic dissection or periaortic stranding on this nongated examination. Reviewed precontrast images is negative for the presence of an intramural hematoma. Borderline cardiomegaly. Small amount of fluid is seen within the pericardial recess. No pericardial effusion. Bovine configuration of the aortic arch. The branch vessels of the aortic arch appear widely patent throughout their imaged course. Although this examination was not tailored for the evaluation the pulmonary arteries, there are no discrete filling defects within the central pulmonary arterial tree to suggest central pulmonary embolism. Normal caliber of the main pulmonary artery.  ------------------------------------------------------------- Thoracic aortic measurements: Sinotubular junction 25 mm as measured in greatest oblique coronal dimension. Proximal ascending aorta 33 mm as measured in greatest oblique axial dimension at the level of the main pulmonary artery. Aortic arch aorta 23 mm as measured in greatest oblique sagittal dimension. Proximal descending thoracic aorta 26 mm as measured in greatest oblique axial dimension at the level of the main pulmonary artery. Distal descending thoracic aorta 21 mm as measured in greatest oblique axial dimension at the level of the diaphragmatic hiatus. Review of the MIP images confirms the above findings. ------------------------------------------------------------- Non-Vascular Findings: Mediastinum/Lymph Nodes: No bulky mediastinal, hilar axillary lymphadenopathy. Lungs/Pleura: No focal airspace opacities. No pleural effusion or pneumothorax. Central pulmonary airways appear widely patent. No discrete pulmonary nodules. Musculoskeletal: No acute or aggressive osseous abnormalities. Post lower cervical ACDF, incompletely evaluated. Regional soft tissues appear normal. Normal appearance of the imaged portions of the thyroid gland. _________________________________________________________ CTA ABDOMEN AND PELVIS FINDINGS VASCULAR Aorta: Normal caliber the abdominal aorta. There is no significant atherosclerotic plaque within the abdominal aorta. No abdominal aortic dissection or periaortic stranding. Celiac: Widely patent.  Conventional branching pattern. SMA: Widely patent.  Conventional branching pattern. Renals: Solitary bilaterally; the bilateral arteries are widely patent without evidence  of a hemodynamically significant narrowing. No vessel irregularity to suggest FMD. IMA: Widely patent without hemodynamically significant narrowing. Inflow: There is a very minimal amount of eccentric calcified plaque involving the bilateral common iliac  arteries (image 141, series 6), not resulting in hemodynamically significant stenosis. The bilateral common, external and internal iliac arteries are of normal caliber and widely patent without hemodynamically significant stenosis. Veins: The IVC and pelvic venous system appears widely patent on this arterial phase examination. Review of the MIP images confirms the above findings. NON-VASCULAR Evaluation the abdominal organs is limited to the arterial phase of enhancement. Hepatobiliary: Normal hepatic contour. No discrete hyperenhancing hepatic lesions. Normal appearance of the gallbladder given degree distention. No radiopaque gallstones. No intra extrahepatic bili duct dilatation. No ascites. Pancreas: Normal appearance of the pancreas Spleen: Normal appearance of the spleen Adrenals/Urinary Tract: There is symmetric enhancement and excretion of the bilateral kidneys. No definite renal stones on this postcontrast examination. No discrete renal lesions. No urinary obstruction or perinephric stranding. Normal appearance of the bilateral adrenal glands. Normal appearance of the urinary bladder given degree distention. Stomach/Bowel: The bowel is normal in course and caliber without wall thickening or evidence of enteric obstruction. Normal appearance of the terminal ileum and appendix. No pneumoperitoneum, pneumatosis or portal venous gas. Lymphatic: No bulky retroperitoneal, mesenteric, pelvic or inguinal lymphadenopathy. Reproductive: The uterus is noted to be retroflexed. No discrete adnexal lesion. No free fluid in the pelvic cul-de-sac. Other: Minimal amount of body wall edema about the bilateral flanks. Musculoskeletal: No acute or aggressive osseous abnormalities. Mild-to-moderate multilevel lumbar spine DDD, worse at L1-L2 with disc space height loss, endplate irregularity and sclerosis. A bone island is noted within the right ilium. Review of the MIP images confirms the above findings. IMPRESSION: Chest CTA  impression: 1. No acute cardiopulmonary disease. Specifically, no evidence of thoracic aortic aneurysm, dissection or periaortic stranding on this nongated examination. No evidence of central pulmonary embolism. 2. Borderline cardiomegaly. Abdomen and pelvic CTA impression: 1. No evidence of abdominal aortic aneurysm. 2. Very minimal amount of calcified atherosclerotic plaque involving the bilateral common iliac arteries, resulting hemodynamically significant stenosis. 3. No acute findings within the abdomen or pelvis. Specifically, no evidence of enteric or urinary obstruction. Normal appearance of the appendix. Electronically Signed   By: Sandi Mariscal M.D.   On: 06/05/2018 20:21    Cardiac Studies   Echo severe LVH EF 65-70%   Patient Profile     47 y.o. female atypical chest pain and hypertensive urgency   Assessment & Plan    1. HTN:  Much improved continue current meds diuretic and norvasc dose adjusted  2. Chest pain : r/o atypical CT no PE and normal aorta with no significant coronary calcium For first part of two part myovue today Likely d/c Saturday if myovue low risk   For questions or updates, please contact [facility] Please consult [email or web address] for contact info under Cardiology/STEMI.      Signed, Jenkins Rouge, MD  06/07/2018, 8:00 AM

## 2018-06-07 NOTE — Progress Notes (Addendum)
Patient presented for Lexiscan. Day 1 of 2 days study. Resting imaging tomorrow.  Nitro gtt turned off 15 minutes prior to test. About 2-3 minutes of test her chest pressure reoccurred. Started back nitro gtt post test improvement. Tolerated procedure well. Pending final stress imaging result. [facility] radiology to read.

## 2018-06-07 NOTE — Progress Notes (Signed)
PROGRESS NOTE    Chelsea Thompson  JOA:[id number] DOB: 03/27/1971 DOA: 06/05/2018 PCP: Biagio Borg, MD   Brief Narrative:  47 y.o. BF PMHx Depression with Anxiety,HTN, HLD, GERD, Hypothyroidism,  OSA/OHS not on CPAP, obesity, Pituitary Adenoma,   Presents with chest pain and left lower abdominal pain. Patient states that her chest pain started 3 PM yesterday. Her chest pain is located in substernal area and also on the right side of chest, initially 10 out of 10 severity, now 8 out of 10 severity, dull, nonradiating.  It is associated with shortness breath and palpitation, but no cough.  She states that she feels hot and has chills.  Her temperature is normal in ED.  No recent long distance traveling.  Patient states that she has been having left lower quadrant abdominal pain in the past 2 weeks.  It has been constant, sharp, 10 out of 10 severity, nonradiating.  She has some nausea but no vomiting.  She states that she has alternative constipation and diarrhea recently.  Today she is constipated. She is been having right-sided headache. Has been having right arm pain as well as tingling in right arm and her right 2nd and 3rd fingers. She also has mild right arm weakness. No vision change, hearing loss, slurred speech or facial droop. She has had difficult to control blood pressure as an outpatient for about 6 months. She was seen by her PCP yesterday, who increased her HCTZ dose from 12.5 to 25 mg daily, but she has not started taking it yet.  She has increased urinary frequency, which she attributes to HCTZ use.  Denies dysuria or burning on urination.   ED Course: pt was found to have blood pressure 241/91, WBC 7.3, negative troponin, negative pregnancy test, electrolytes renal function okay, temperature normal, no tachycardia, oxygen saturation 87 to 100% on room air, negative chest x-ray.  Patient is placed on stepdown bed for observation.        Subjective: 6/28 A/O x4, negative CP,  negative S OB, negative abdominal pain.  Continued headache but now easing off post morphine.  Some confusion as to the Imitrex route of dosing.  States p.o. Imitrex causes N/V    Assessment & Plan:   Principal Problem:   Chest pain Active Problems:   Hypothyroidism   Essential hypertension   Hyperlipidemia   Abdominal pain   Hypertensive urgency   Numbness and tingling of right arm  Chest pain/HTN urgency -On admission BP 241/91 - Chest pain most likely secondary to HTN urgency - Trend troponins.  R/O ACS vs MI - On admission EKG showed T wave inversion lead III, aVF -Cardiology consulted -Amlodipine 10 mg -ASA 325 mg daily - Lasix IV 60 mg BID -HCTZ (discontinued) -6/28 hydralazine IV 5 mg QID--> -Irbesartan 300 mg daily - 6/27 Nadolol 120 mg daily . -Morphine IV 2 mg x 1 -6/28 Nitroglycerin drip now off  -SBP goal 120 -140 over next 24 hours  -Echocardiogram pending - Stress test pending per cardiology - Nutrition consult for heart healthy diet teaching. -6/28 first 1/2 Myoview stress test completed.  Second 1/2 scheduled for 6/01  Chronic diastolic CHF -Strict in and out -Daily weight -Continue to aggressively control HTN see HTN urgency -Transfuse for hemoglobin<8   Pulmonary HTN -See CHF  Hypothyroidism  - TSH WNL - Synthroid 25 mcg daily   Hypokalemia -Potassium goal> 4 -K-Dur 40 meq x 2 doses   HLD - 6/28 lipid panel not within ADA guideline  -  6/28 Lipitor 40 mg daily    LLQ abdominal pain -CTA of abd/pelvis did not showed acute intraabdominal abnormalities except for very minimal amount of calcified atherosclerotic plaque involving the bilateral common iliac arteries, resulting hemodynamicallysignificant stenosis, not sure if this is related.   -UA pending -Lipase WNL -Currently asymptomatic  Numbness and tingling of right arm:  -Possibly due to carpal tunnel syndrome. Low suspicion for Stroke since patient has pain in the right arm -try  wrist splint    DVT prophylaxis: Lovenox Code Status: Full Family Communication: Daughter at bedside for discussion of plan of care Disposition Plan: TBD   Consultants:  Cardiology     Procedures/Significant Events:  CT-head showed no acute intracranial abnormality, and partially empty pituitary sella similar in appearance to prior MRI. CTA of    Chest CTA: is negative for acute issues.  No dissection.     Abdomen and pelvic CTA: 1. No evidence of abdominal aortic aneurysm. 2. Very minimal amount of calcified atherosclerotic plaque involving the bilateral common iliac arteries, resulting hemodynamicallysignificant stenosis. 3. No acute findings within the abdomen or pelvis. Specifically, no evidence of enteric or urinary obstruction. Normal appearance of theappendix. 6/27 echocardiogram:Left ventricle:  severe LVH.-LVEF =65%-70%.  -Grade 1 diastolic dysfunction - Pulmonary arteries:  PA   peak pressure: 43 mm Hg (S).    I have personally reviewed and interpreted all radiology studies and my findings are as above.  VENTILATOR SETTINGS:    Cultures 6/28 urine pending    Antimicrobials:    Devices    LINES / TUBES:      Continuous Infusions: . nitroGLYCERIN 60 mcg/min (06/07/18 0443)     Objective: Vitals:   06/06/18 2300 06/06/18 2350 06/07/18 0414 06/07/18 0800  BP: 124/63 (!) 116/58 (!) 146/70 (!) 142/82  Pulse:  87 84 81  Resp: (!) 24 (!) 22 (!) 21 20  Temp:  98.1 F (36.7 C) 98.4 F (36.9 C)   TempSrc:  Oral Oral   SpO2:  99% 94% 96%  Weight:      Height:        Intake/Output Summary (Last 24 hours) at 06/07/2018 0844 Last data filed at 06/07/2018 0500 Gross per 24 hour  Intake 712.81 ml  Output 1000 ml  Net -287.19 ml   Filed Weights   06/05/18 1502 06/06/18 0539  Weight: (!) 318 lb (144.2 kg) (!) 318 lb 3.2 oz (144.3 kg)     Physical Exam:  General: A/O x4, No acute respiratory distress Neck:  Negative scars, masses,  torticollis, lymphadenopathy, JVD Lungs: Clear to auscultation bilaterally without wheezes or crackles Cardiovascular: Regular rate and rhythm without murmur gallop or rub normal S1 and S2 Abdomen: MORBIDLY OBESE, negative abdominal pain, nondistended, positive soft, bowel sounds, no rebound, no ascites, no appreciable mass Extremities: No significant cyanosis, clubbing, or edema bilateral lower extremities Skin: Negative rashes, lesions, ulcers Psychiatric:  Negative depression, negative anxiety, negative fatigue, negative mania  Central nervous system:  Cranial nerves II through XII intact, tongue/uvula midline, all extremities muscle strength 5/5, sensation intact throughout,  negative dysarthria, negative expressive aphasia, negative receptive aphasia. .     Data Reviewed: Care during the described time interval was provided by me .  I have reviewed this patient's available data, including medical history, events of note, physical examination, and all test results as part of my evaluation.   CBC: Recent Labs  Lab 06/05/18 1550 06/06/18 0931  WBC 7.3 9.3  HGB 13.5 12.8  HCT 42.8 39.7  MCV 84.6 82.9  PLT 300 169   Basic Metabolic Panel: Recent Labs  Lab 06/05/18 1550 06/06/18 0931 06/07/18 0625  NA 139 136 136  K 4.4 3.3* 3.0*  CL 104 99 100  CO2 24 26 27   GLUCOSE 93 117* 105*  BUN 13 11 16   CREATININE 0.60 0.59 0.88  CALCIUM 9.5 9.0 9.1  MG  --  1.7 1.9   GFR: Estimated Creatinine Clearance: 115.3 mL/min (by C-G formula based on SCr of 0.88 mg/dL). Liver Function Tests: No results for input(s): AST, ALT, ALKPHOS, BILITOT, PROT, ALBUMIN in the last 168 hours. Recent Labs  Lab 06/05/18 2230  LIPASE 22   No results for input(s): AMMONIA in the last 168 hours. Coagulation Profile: No results for input(s): INR, PROTIME in the last 168 hours. Cardiac Enzymes: Recent Labs  Lab 06/05/18 1550 06/05/18 2230 06/06/18 0359 06/06/18 0931  TROPONINI <0.03 <0.03 <0.03  <0.03   BNP (last 3 results) No results for input(s): PROBNP in the last 8760 hours. HbA1C: Recent Labs    06/06/18 0350 06/07/18 0625  HGBA1C 5.4 5.4   CBG: No results for input(s): GLUCAP in the last 168 hours. Lipid Profile: Recent Labs    06/06/18 0359 06/07/18 0625  CHOL 201* 183  HDL 58 53  LDLCALC 133* 118*  TRIG 48 61  CHOLHDL 3.5 3.5   Thyroid Function Tests: Recent Labs    06/07/18 0625  TSH 1.144   Anemia Panel: No results for input(s): VITAMINB12, FOLATE, FERRITIN, TIBC, IRON, RETICCTPCT in the last 72 hours. Urine analysis:    Component Value Date/Time   COLORURINE YELLOW 06/05/2018 2158   APPEARANCEUR CLEAR 06/05/2018 2158   LABSPEC 1.042 (H) 06/05/2018 2158   PHURINE 6.0 06/05/2018 2158   GLUCOSEU NEGATIVE 06/05/2018 2158   GLUCOSEU NEGATIVE 05/21/2018 1523   HGBUR NEGATIVE 06/05/2018 2158   BILIRUBINUR NEGATIVE 06/05/2018 2158   KETONESUR 20 (A) 06/05/2018 2158   PROTEINUR 30 (A) 06/05/2018 2158   UROBILINOGEN 0.2 05/21/2018 1523   NITRITE NEGATIVE 06/05/2018 2158   LEUKOCYTESUR NEGATIVE 06/05/2018 2158   Sepsis Labs: @LABRCNTIP (procalcitonin:4,lacticidven:4)  )No results found for this or any previous visit (from the past 240 hour(s)).       Radiology Studies: Dg Chest 2 View  Result Date: 06/05/2018 CLINICAL DATA:  Chest pain EXAM: CHEST - 2 VIEW COMPARISON:  02/07/2018 FINDINGS: The heart size and mediastinal contours are within normal limits. Both lungs are clear. The visualized skeletal structures are unremarkable. Postsurgical changes are noted in the cervical spine. IMPRESSION: No active cardiopulmonary disease. Electronically Signed   By: Inez Catalina M.D.   On: 06/05/2018 15:57   Ct Head Wo Contrast  Result Date: 06/05/2018 CLINICAL DATA:  Headache of acute onset. EXAM: CT HEAD WITHOUT CONTRAST TECHNIQUE: Contiguous axial images were obtained from the base of the skull through the vertex without intravenous contrast.  COMPARISON:  None. FINDINGS: BRAIN: The ventricles and sulci are normal. No intraparenchymal hemorrhage, mass effect nor midline shift. No acute large vascular territory infarcts. Grey-white matter distinction is maintained. The basal ganglia are unremarkable. No abnormal extra-axial fluid collections. Basal cisterns are not effaced and midline. Midline fourth ventricle without effacement. Partially empty pituitary sella. The brainstem and cerebellar hemispheres are without acute abnormalities. VASCULAR: No hyperdense vessel sign.  No unexpected calcifications. SKULL/SOFT TISSUES: No skull fracture. No significant soft tissue swelling. ORBITS/SINUSES: The included ocular globes and orbital contents are normal.The mastoid air cells are clear. The included paranasal sinuses are well-aerated. OTHER:  None. IMPRESSION: No acute intracranial abnormality. Partially empty pituitary sella similar in appearance to prior MRI. Electronically Signed   By: Ashley Royalty M.D.   On: 06/05/2018 19:57   Ct Angio Chest/abd/pel For Dissection W And/or Wo Contrast  Result Date: 06/05/2018 CLINICAL DATA:  Chest pain. Shortness of breath. Nausea and vomiting. Evaluate for thoracic aortic dissection. EXAM: CT ANGIOGRAPHY CHEST, ABDOMEN AND PELVIS TECHNIQUE: Multidetector CT imaging through the chest, abdomen and pelvis was performed using the standard protocol during bolus administration of intravenous contrast. Multiplanar reconstructed images and MIPs were obtained and reviewed to evaluate the vascular anatomy. CONTRAST:  100 cc ISOVUE-370 IOPAMIDOL (ISOVUE-370) INJECTION 76% COMPARISON:  None. FINDINGS: CTA CHEST FINDINGS Vascular Findings: Normal caliber of the thoracic aorta. No evidence of thoracic aortic dissection or periaortic stranding on this nongated examination. Reviewed precontrast images is negative for the presence of an intramural hematoma. Borderline cardiomegaly. Small amount of fluid is seen within the pericardial  recess. No pericardial effusion. Bovine configuration of the aortic arch. The branch vessels of the aortic arch appear widely patent throughout their imaged course. Although this examination was not tailored for the evaluation the pulmonary arteries, there are no discrete filling defects within the central pulmonary arterial tree to suggest central pulmonary embolism. Normal caliber of the main pulmonary artery. ------------------------------------------------------------- Thoracic aortic measurements: Sinotubular junction 25 mm as measured in greatest oblique coronal dimension. Proximal ascending aorta 33 mm as measured in greatest oblique axial dimension at the level of the main pulmonary artery. Aortic arch aorta 23 mm as measured in greatest oblique sagittal dimension. Proximal descending thoracic aorta 26 mm as measured in greatest oblique axial dimension at the level of the main pulmonary artery. Distal descending thoracic aorta 21 mm as measured in greatest oblique axial dimension at the level of the diaphragmatic hiatus. Review of the MIP images confirms the above findings. ------------------------------------------------------------- Non-Vascular Findings: Mediastinum/Lymph Nodes: No bulky mediastinal, hilar axillary lymphadenopathy. Lungs/Pleura: No focal airspace opacities. No pleural effusion or pneumothorax. Central pulmonary airways appear widely patent. No discrete pulmonary nodules. Musculoskeletal: No acute or aggressive osseous abnormalities. Post lower cervical ACDF, incompletely evaluated. Regional soft tissues appear normal. Normal appearance of the imaged portions of the thyroid gland. _________________________________________________________ CTA ABDOMEN AND PELVIS FINDINGS VASCULAR Aorta: Normal caliber the abdominal aorta. There is no significant atherosclerotic plaque within the abdominal aorta. No abdominal aortic dissection or periaortic stranding. Celiac: Widely patent.  Conventional  branching pattern. SMA: Widely patent.  Conventional branching pattern. Renals: Solitary bilaterally; the bilateral arteries are widely patent without evidence of a hemodynamically significant narrowing. No vessel irregularity to suggest FMD. IMA: Widely patent without hemodynamically significant narrowing. Inflow: There is a very minimal amount of eccentric calcified plaque involving the bilateral common iliac arteries (image 141, series 6), not resulting in hemodynamically significant stenosis. The bilateral common, external and internal iliac arteries are of normal caliber and widely patent without hemodynamically significant stenosis. Veins: The IVC and pelvic venous system appears widely patent on this arterial phase examination. Review of the MIP images confirms the above findings. NON-VASCULAR Evaluation the abdominal organs is limited to the arterial phase of enhancement. Hepatobiliary: Normal hepatic contour. No discrete hyperenhancing hepatic lesions. Normal appearance of the gallbladder given degree distention. No radiopaque gallstones. No intra extrahepatic bili duct dilatation. No ascites. Pancreas: Normal appearance of the pancreas Spleen: Normal appearance of the spleen Adrenals/Urinary Tract: There is symmetric enhancement and excretion of the bilateral kidneys. No definite renal stones on this postcontrast examination. No discrete  renal lesions. No urinary obstruction or perinephric stranding. Normal appearance of the bilateral adrenal glands. Normal appearance of the urinary bladder given degree distention. Stomach/Bowel: The bowel is normal in course and caliber without wall thickening or evidence of enteric obstruction. Normal appearance of the terminal ileum and appendix. No pneumoperitoneum, pneumatosis or portal venous gas. Lymphatic: No bulky retroperitoneal, mesenteric, pelvic or inguinal lymphadenopathy. Reproductive: The uterus is noted to be retroflexed. No discrete adnexal lesion. No  free fluid in the pelvic cul-de-sac. Other: Minimal amount of body wall edema about the bilateral flanks. Musculoskeletal: No acute or aggressive osseous abnormalities. Mild-to-moderate multilevel lumbar spine DDD, worse at L1-L2 with disc space height loss, endplate irregularity and sclerosis. A bone island is noted within the right ilium. Review of the MIP images confirms the above findings. IMPRESSION: Chest CTA impression: 1. No acute cardiopulmonary disease. Specifically, no evidence of thoracic aortic aneurysm, dissection or periaortic stranding on this nongated examination. No evidence of central pulmonary embolism. 2. Borderline cardiomegaly. Abdomen and pelvic CTA impression: 1. No evidence of abdominal aortic aneurysm. 2. Very minimal amount of calcified atherosclerotic plaque involving the bilateral common iliac arteries, resulting hemodynamically significant stenosis. 3. No acute findings within the abdomen or pelvis. Specifically, no evidence of enteric or urinary obstruction. Normal appearance of the appendix. Electronically Signed   By: Sandi Mariscal M.D.   On: 06/05/2018 20:21        Scheduled Meds: . amLODipine  10 mg Oral Daily  . aspirin EC  325 mg Oral Daily  . cholecalciferol  2,000 Units Oral Daily  . enoxaparin (LOVENOX) injection  40 mg Subcutaneous QHS  . furosemide  60 mg Intravenous BID  . hydrALAZINE  5 mg Intravenous Q6H  . irbesartan  300 mg Oral Daily  . levothyroxine  25 mcg Oral QAC breakfast  .  morphine injection  2 mg Intravenous Once  . multivitamin with minerals  1 tablet Oral Daily  . nadolol  120 mg Oral Daily   Continuous Infusions: . nitroGLYCERIN 60 mcg/min (06/07/18 0443)     LOS: 0 days    Time spent: 40 minutes    Velisa Regnier, Geraldo Docker, MD [facility] Pager [phone]   If 7PM-7AM, please contact night-coverage [email or web address] Password [facility] 06/07/2018, 8:44 AM

## 2018-06-08 ENCOUNTER — Inpatient Hospital Stay (HOSPITAL_COMMUNITY): Payer: BLUE CROSS/BLUE SHIELD

## 2018-06-08 DIAGNOSIS — R0789 Other chest pain: Secondary | ICD-10-CM

## 2018-06-08 LAB — BASIC METABOLIC PANEL
Anion gap: 8 (ref 5–15)
BUN: 12 mg/dL (ref 6–20)
CO2: 31 mmol/L (ref 22–32)
Calcium: 9 mg/dL (ref 8.9–10.3)
Chloride: 99 mmol/L (ref 98–111)
Creatinine, Ser: 0.84 mg/dL (ref 0.44–1.00)
GFR calc Af Amer: >60 mL/min (ref >60)
GFR calc non Af Amer: >60 mL/min (ref >60)
Glucose, Bld: 97 mg/dL (ref 70–99)
Potassium: 3.8 mmol/L (ref 3.5–5.1)
Sodium: 138 mmol/L (ref 135–145)

## 2018-06-08 LAB — NM MYOCAR MULTI W/SPECT W/WALL MOTION / EF
Estimated workload: 1 METS
Exercise duration (min): 5 min
Exercise duration (sec): 1 s
Peak HR: 116 {beats}/min
Rest HR: 87 {beats}/min

## 2018-06-08 LAB — MAGNESIUM: Magnesium: 2 mg/dL (ref 1.7–2.4)

## 2018-06-08 MED ORDER — NADOLOL 40 MG PO TABS
40.0000 mg | ORAL_TABLET | Freq: Once | ORAL | Status: AC
  Administered 2018-06-08: 40 mg via ORAL
  Filled 2018-06-08: qty 1

## 2018-06-08 MED ORDER — POLYETHYLENE GLYCOL 3350 17 G PO PACK
17.0000 g | PACK | Freq: Two times a day (BID) | ORAL | Status: DC
  Administered 2018-06-08 – 2018-06-10 (×3): 17 g via ORAL
  Filled 2018-06-08 (×4): qty 1

## 2018-06-08 MED ORDER — TECHNETIUM TC 99M TETROFOSMIN IV KIT
30.0000 | PACK | Freq: Once | INTRAVENOUS | Status: AC | PRN
  Administered 2018-06-07: 30 via INTRAVENOUS

## 2018-06-08 MED ORDER — NADOLOL 80 MG PO TABS
160.0000 mg | ORAL_TABLET | Freq: Every day | ORAL | Status: DC
  Administered 2018-06-09 – 2018-06-10 (×2): 160 mg via ORAL
  Filled 2018-06-08 (×2): qty 2

## 2018-06-08 MED ORDER — TECHNETIUM TC 99M TETROFOSMIN IV KIT
30.0000 | PACK | Freq: Once | INTRAVENOUS | Status: AC | PRN
  Administered 2018-06-08: 30 via INTRAVENOUS

## 2018-06-08 MED ORDER — TRAZODONE HCL 50 MG PO TABS
25.0000 mg | ORAL_TABLET | Freq: Every day | ORAL | Status: DC
  Administered 2018-06-08: 25 mg via ORAL
  Filled 2018-06-08: qty 1

## 2018-06-08 NOTE — Progress Notes (Signed)
Pt complained of chest pain, mid sternal, stated its pressure 5/10 not radiating anywhere. Denies SOB. Pt refused to take nitroglycerine sub lingual, stated she thinks its gas. 12 lead EKG showed NSR, pt stated she wants to walk and see if it relieves. Pt walked with her husband, stated her pain went down some, 3/10, requested morphine. See mar for pain medication administration. Call light within reach. Will continue to monitor.

## 2018-06-08 NOTE — Progress Notes (Signed)
Stress test reviewed. No evidence of ischemia, LVEF normal. Some LV enlargement bynuclear but her LV size by echo is normal. Overall low risk study. No further cardiac workup at this time, defer bp management to primary team. We will sign off  Zandra Abts MD

## 2018-06-08 NOTE — Progress Notes (Signed)
Dr Mariana Arn rounding note reviewed. He is awaiting day 2 of his 2 day myoview. Further cardiology recs pending those findings. Trops have been negative, EKG LVH with probable strain pattern. Echo normal LVEF 65-70%, no WMAs, grade I diastolic dysfunction. Symptoms from consult note reported as atypical. Further recs pending nuke results   Zandra Abts MD

## 2018-06-08 NOTE — Progress Notes (Signed)
PROGRESS NOTE    Chelsea Thompson  VFI:[id number] DOB: 05-10-1971 DOA: 06/05/2018 PCP: Biagio Borg, MD   Brief Narrative:  47 y.o. BF PMHx Depression with Anxiety,HTN, HLD, GERD, Hypothyroidism,  OSA/OHS not on CPAP, obesity, Pituitary Adenoma,   Presents with chest pain and left lower abdominal pain. Patient states that her chest pain started 3 PM yesterday. Her chest pain is located in substernal area and also on the right side of chest, initially 10 out of 10 severity, now 8 out of 10 severity, dull, nonradiating.  It is associated with shortness breath and palpitation, but no cough.  She states that she feels hot and has chills.  Her temperature is normal in ED.  No recent long distance traveling.  Patient states that she has been having left lower quadrant abdominal pain in the past 2 weeks.  It has been constant, sharp, 10 out of 10 severity, nonradiating.  She has some nausea but no vomiting.  She states that she has alternative constipation and diarrhea recently.  Today she is constipated. She is been having right-sided headache. Has been having right arm pain as well as tingling in right arm and her right 2nd and 3rd fingers. She also has mild right arm weakness. No vision change, hearing loss, slurred speech or facial droop. She has had difficult to control blood pressure as an outpatient for about 6 months. She was seen by her PCP yesterday, who increased her HCTZ dose from 12.5 to 25 mg daily, but she has not started taking it yet.  She has increased urinary frequency, which she attributes to HCTZ use.  Denies dysuria or burning on urination.   ED Course: pt was found to have blood pressure 241/91, WBC 7.3, negative troponin, negative pregnancy test, electrolytes renal function okay, temperature normal, no tachycardia, oxygen saturation 87 to 100% on room air, negative chest x-ray.  Patient is placed on stepdown bed for observation.        Subjective: 6/29 A/O x4, negative CP,  negative S OB.  Feels constipated.  Headache now 6/10.  Patient in much better spirits.   Assessment & Plan:   Principal Problem:   Chest pain Active Problems:   Hypothyroidism   Essential hypertension   Hyperlipidemia   Abdominal pain   Hypertensive urgency   Numbness and tingling of right arm  Chest pain/HTN urgency -On admission BP 241/91 - Chest pain most likely secondary to HTN urgency - Trend troponins.  R/O ACS vs MI - On admission EKG showed T wave inversion lead III, aVF -Cardiology consulted -Amlodipine 10 mg -ASA 325 mg daily - 6/29 change Lasix IV to Lasix PO 60 mg BID upon discharge -Hydralazine 25 mg TID  -Irbesartan 300 mg daily  - 6/29 increase Nadolol 160 mg daily. -Morphine IV 2 mg x 1 -6/28 Nitroglycerin drip now off  -SBP goal 120 -140 over next 24 hours  -Echocardiogram: Severe LVH pulmonary HTN see results below - Stress test ; low risk study see results below - Nutrition consult for heart healthy diet teaching.  Chronic diastolic CHF -Strict in and out since admission -767ml -Daily weight Filed Weights   06/05/18 1502 06/06/18 0539 06/08/18 0417  Weight: (!) 318 lb (144.2 kg) (!) 318 lb 3.2 oz (144.3 kg) (!) 315 lb 3.2 oz (143 kg)  -Continue to aggressively control HTN see HTN urgency -Transfuse for hemoglobin<8   Pulmonary HTN -See CHF  Hypothyroidism  - TSH WNL - Synthroid 25 mcg daily   Hypokalemia -  Potassium goal> 4  HLD - 6/28 lipid panel not within ADA guideline  - 6/28 Lipitor 40 mg daily  LLQ abdominal pain -CTA of abd/pelvis did not showed acute intraabdominal abnormalities except for very minimal amount of calcified atherosclerotic plaque involving the bilateral common iliac arteries, resulting hemodynamicallysignificant stenosis, not sure if this is related.   -UA pending -Lipase WNL -Currently asymptomatic  Numbness and tingling of right arm:  -Possibly due to carpal tunnel syndrome. Low suspicion for Stroke since  patient has pain in the right arm -try wrist splint  Insomnia - DC Ambien - Trazodone 25 mg Qhs    DVT prophylaxis: Lovenox Code Status: Full Family Communication: Husband and son at bedside for plan of care Disposition Plan: TBD   Consultants:  Cardiology     Procedures/Significant Events:  CT-head showed no acute intracranial abnormality, and partially empty pituitary sella similar in appearance to prior MRI. CTA of    Chest CTA: is negative for acute issues.  No dissection.     Abdomen and pelvic CTA: 1. No evidence of abdominal aortic aneurysm. 2. Very minimal amount of calcified atherosclerotic plaque involving the bilateral common iliac arteries, resulting hemodynamicallysignificant stenosis. 3. No acute findings within the abdomen or pelvis. Specifically, no evidence of enteric or urinary obstruction. Normal appearance of theappendix. 6/27 echocardiogram:Left ventricle:  severe LVH.-LVEF =65%-70%.  -Grade 1 diastolic dysfunction - Pulmonary arteries:  PA   peak pressure: 43 mm Hg (S). 6/29 Myoview stress test.-No evidence of ischemia, LVEF normal. Some LV enlargement bynuclear but her LV size by echo is normal. Overall low risk study     I have personally reviewed and interpreted all radiology studies and my findings are as above.  VENTILATOR SETTINGS:    Cultures 6/28 urine pending    Antimicrobials: Anti-infectives (From admission, onward)   None      Devices    LINES / TUBES:      Continuous Infusions:    Objective: Vitals:   06/07/18 1930 06/07/18 2038 06/07/18 2345 06/08/18 0417  BP: (!) 107/59 128/67 113/80 132/68  Pulse:   81 75  Resp: 20 17 19 18   Temp:  97.7 F (36.5 C) 97.6 F (36.4 C) 98.3 F (36.8 C)  TempSrc:  Oral Oral Oral  SpO2: 97% 100% 100% 93%  Weight:    (!) 315 lb 3.2 oz (143 kg)  Height:        Intake/Output Summary (Last 24 hours) at 06/08/2018 0820 Last data filed at 06/08/2018 0600 Gross per 24 hour    Intake 960 ml  Output 2950 ml  Net -1990 ml   Filed Weights   06/05/18 1502 06/06/18 0539 06/08/18 0417  Weight: (!) 318 lb (144.2 kg) (!) 318 lb 3.2 oz (144.3 kg) (!) 315 lb 3.2 oz (143 kg)     Physical Exam:  General: A/O x4, No acute respiratory distress Neck:  Negative scars, masses, torticollis, lymphadenopathy, JVD Lungs: Clear to auscultation bilaterally without wheezes or crackles Cardiovascular: Regular rate and rhythm without murmur gallop or rub normal S1 and S2 Abdomen: Morbidly OBESE negative abdominal pain, nondistended, positive soft, bowel sounds, no rebound, no ascites, no appreciable mass Extremities: No significant cyanosis, clubbing, or edema bilateral lower extremities Skin: Negative rashes, lesions, ulcers Psychiatric:  Negative depression, negative anxiety, negative fatigue, negative mania  Central nervous system:  Cranial nerves II through XII intact, tongue/uvula midline, all extremities muscle strength 5/5, sensation intact throughout, negative dysarthria, negative expressive aphasia, negative receptive aphasia.  Data Reviewed: Care during the described time interval was provided by me .  I have reviewed this patient's available data, including medical history, events of note, physical examination, and all test results as part of my evaluation.   CBC: Recent Labs  Lab 06/05/18 1550 06/06/18 0931  WBC 7.3 9.3  HGB 13.5 12.8  HCT 42.8 39.7  MCV 84.6 82.9  PLT 300 409   Basic Metabolic Panel: Recent Labs  Lab 06/05/18 1550 06/06/18 0931 06/07/18 0625 06/08/18 0255  NA 139 136 136 138  K 4.4 3.3* 3.0* 3.8  CL 104 99 100 99  CO2 24 26 27 31   GLUCOSE 93 117* 105* 97  BUN 13 11 16 12   CREATININE 0.60 0.59 0.88 0.84  CALCIUM 9.5 9.0 9.1 9.0  MG  --  1.7 1.9 2.0   GFR: Estimated Creatinine Clearance: 120.2 mL/min (by C-G formula based on SCr of 0.84 mg/dL). Liver Function Tests: No results for input(s): AST, ALT, ALKPHOS, BILITOT, PROT,  ALBUMIN in the last 168 hours. Recent Labs  Lab 06/05/18 2230  LIPASE 22   No results for input(s): AMMONIA in the last 168 hours. Coagulation Profile: No results for input(s): INR, PROTIME in the last 168 hours. Cardiac Enzymes: Recent Labs  Lab 06/05/18 1550 06/05/18 2230 06/06/18 0359 06/06/18 0931  TROPONINI <0.03 <0.03 <0.03 <0.03   BNP (last 3 results) No results for input(s): PROBNP in the last 8760 hours. HbA1C: Recent Labs    06/06/18 0350 06/07/18 0625  HGBA1C 5.4 5.4   CBG: No results for input(s): GLUCAP in the last 168 hours. Lipid Profile: Recent Labs    06/06/18 0359 06/07/18 0625  CHOL 201* 183  HDL 58 53  LDLCALC 133* 118*  TRIG 48 61  CHOLHDL 3.5 3.5   Thyroid Function Tests: Recent Labs    06/07/18 0625  TSH 1.144   Anemia Panel: No results for input(s): VITAMINB12, FOLATE, FERRITIN, TIBC, IRON, RETICCTPCT in the last 72 hours. Urine analysis:    Component Value Date/Time   COLORURINE YELLOW 06/05/2018 2158   APPEARANCEUR CLEAR 06/05/2018 2158   LABSPEC 1.042 (H) 06/05/2018 2158   PHURINE 6.0 06/05/2018 2158   GLUCOSEU NEGATIVE 06/05/2018 2158   GLUCOSEU NEGATIVE 05/21/2018 1523   HGBUR NEGATIVE 06/05/2018 2158   BILIRUBINUR NEGATIVE 06/05/2018 2158   KETONESUR 20 (A) 06/05/2018 2158   PROTEINUR 30 (A) 06/05/2018 2158   UROBILINOGEN 0.2 05/21/2018 1523   NITRITE NEGATIVE 06/05/2018 2158   LEUKOCYTESUR NEGATIVE 06/05/2018 2158   Sepsis Labs: @LABRCNTIP (procalcitonin:4,lacticidven:4)  )No results found for this or any previous visit (from the past 240 hour(s)).       Radiology Studies: No results found.      Scheduled Meds: . amLODipine  10 mg Oral Daily  . aspirin EC  325 mg Oral Daily  . atorvastatin  40 mg Oral q1800  . cholecalciferol  2,000 Units Oral Daily  . enoxaparin (LOVENOX) injection  40 mg Subcutaneous QHS  . furosemide  60 mg Intravenous BID  . hydrALAZINE  25 mg Oral Q8H  . irbesartan  300 mg Oral  Daily  . levothyroxine  25 mcg Oral QAC breakfast  .  morphine injection  2 mg Intravenous Once  . multivitamin with minerals  1 tablet Oral Daily  . nadolol  120 mg Oral Daily  . SUMAtriptan  6 mg Subcutaneous Once   Continuous Infusions:    LOS: 1 day    Time spent: 40 minutes    Christabelle Hanzlik,  Geraldo Docker, MD [facility] Pager [phone]   If 7PM-7AM, please contact night-coverage [email or web address] Password [email or web address] 06/08/2018, 8:20 AM

## 2018-06-09 LAB — BASIC METABOLIC PANEL
Anion gap: 10 (ref 5–15)
BUN: 15 mg/dL (ref 6–20)
CO2: 28 mmol/L (ref 22–32)
Calcium: 9.1 mg/dL (ref 8.9–10.3)
Chloride: 98 mmol/L (ref 98–111)
Creatinine, Ser: 0.72 mg/dL (ref 0.44–1.00)
GFR calc Af Amer: >60 mL/min (ref >60)
GFR calc non Af Amer: >60 mL/min (ref >60)
Glucose, Bld: 107 mg/dL — ABNORMAL HIGH (ref 70–99)
Potassium: 3.3 mmol/L — ABNORMAL LOW (ref 3.5–5.1)
Sodium: 136 mmol/L (ref 135–145)

## 2018-06-09 LAB — URINE CULTURE: Culture: <10000 — AB

## 2018-06-09 LAB — MAGNESIUM: Magnesium: 1.9 mg/dL (ref 1.7–2.4)

## 2018-06-09 MED ORDER — FUROSEMIDE 10 MG/ML IJ SOLN: 60.0000 mg | Freq: Once | INTRAMUSCULAR | Status: DC

## 2018-06-09 MED ORDER — FUROSEMIDE 10 MG/ML IJ SOLN
20.0000 mg | Freq: Once | INTRAMUSCULAR | Status: AC
  Administered 2018-06-09: 20 mg via INTRAVENOUS
  Filled 2018-06-09: qty 2

## 2018-06-09 MED ORDER — PAROXETINE HCL 20 MG PO TABS
20.0000 mg | ORAL_TABLET | Freq: Every day | ORAL | Status: DC
  Administered 2018-06-09 – 2018-06-10 (×2): 20 mg via ORAL
  Filled 2018-06-09 (×2): qty 1

## 2018-06-09 MED ORDER — ZOLPIDEM TARTRATE 5 MG PO TABS
5.0000 mg | ORAL_TABLET | Freq: Every evening | ORAL | Status: DC | PRN
Start: 2018-06-09 — End: 2018-06-10
  Administered 2018-06-09: 5 mg via ORAL
  Filled 2018-06-09: qty 1

## 2018-06-09 MED ORDER — HYDRALAZINE HCL 50 MG PO TABS
50.0000 mg | ORAL_TABLET | Freq: Three times a day (TID) | ORAL | Status: DC
  Administered 2018-06-09 – 2018-06-10 (×4): 50 mg via ORAL
  Filled 2018-06-09 (×4): qty 1

## 2018-06-09 MED ORDER — FUROSEMIDE 10 MG/ML IJ SOLN: 80.0000 mg | Freq: Once | INTRAMUSCULAR | Status: DC

## 2018-06-09 NOTE — Progress Notes (Signed)
PROGRESS NOTE    Aleaha Fickling  LGX:[id number] DOB: June 23, 1971 DOA: 06/05/2018 PCP: Biagio Borg, MD   Brief Narrative:  47 y.o. BF PMHx Depression with Anxiety,HTN, HLD, GERD, Hypothyroidism,  OSA/OHS not on CPAP, obesity, Pituitary Adenoma,   Presents with chest pain and left lower abdominal pain. Patient states that her chest pain started 3 PM yesterday. Her chest pain is located in substernal area and also on the right side of chest, initially 10 out of 10 severity, now 8 out of 10 severity, dull, nonradiating.  It is associated with shortness breath and palpitation, but no cough.  She states that she feels hot and has chills.  Her temperature is normal in ED.  No recent long distance traveling.  Patient states that she has been having left lower quadrant abdominal pain in the past 2 weeks.  It has been constant, sharp, 10 out of 10 severity, nonradiating.  She has some nausea but no vomiting.  She states that she has alternative constipation and diarrhea recently.  Today she is constipated. She is been having right-sided headache. Has been having right arm pain as well as tingling in right arm and her right 2nd and 3rd fingers. She also has mild right arm weakness. No vision change, hearing loss, slurred speech or facial droop. She has had difficult to control blood pressure as an outpatient for about 6 months. She was seen by her PCP yesterday, who increased her HCTZ dose from 12.5 to 25 mg daily, but she has not started taking it yet.  She has increased urinary frequency, which she attributes to HCTZ use.  Denies dysuria or burning on urination.   ED Course: pt was found to have blood pressure 241/91, WBC 7.3, negative troponin, negative pregnancy test, electrolytes renal function okay, temperature normal, no tachycardia, oxygen saturation 87 to 100% on room air, negative chest x-ray.  Patient is placed on stepdown bed for observation.        Subjective: 6/30  A/O x4 negative CP,  negative S OB, negative abdominal pain.  Negative headache.  States did not sleep well last night.  In much better spirits today.      Assessment & Plan:   Principal Problem:   Chest pain Active Problems:   Hypothyroidism   Essential hypertension   Hyperlipidemia   Abdominal pain   Hypertensive urgency   Numbness and tingling of right arm  Chest pain/HTN urgency -On admission BP 241/91 - Chest pain most likely secondary to HTN urgency - Trend troponins.  R/O ACS vs MI - On admission EKG showed T wave inversion lead III, aVF -Cardiology consulted -Amlodipine 10 mg -ASA 325 mg daily - 6/29 change Lasix IV to Lasix PO 60 mg BID upon discharge -6/30 Lasix IV 80 mg x 1 -6/30 increase Hydralazine 50 mg TID  -Irbesartan 300 mg daily  - 6/29 increase Nadolol 160 mg daily. -SBP goal 120 -140  -Echocardiogram: Severe LVH pulmonary HTN see results below - Stress test ; low risk study see results below - Nutrition consult for heart healthy diet teaching.  Chronic diastolic CHF -Strict in and out since admission -2.9 L -Daily weight Filed Weights   06/06/18 0539 06/08/18 0417 06/09/18 0258  Weight: (!) 318 lb 3.2 oz (144.3 kg) (!) 315 lb 3.2 oz (143 kg) (!) 315 lb 1.6 oz (142.9 kg)  -Continue to aggressively control HTN see HTN urgency -Transfuse for hemoglobin<8   Pulmonary HTN -See CHF  Hypothyroidism  - TSH WNL -  Synthroid 25 mcg daily   Hypokalemia -Potassium goal> 4  HLD - 6/28 lipid panel not within ADA guideline  - 6/28 Lipitor 40 mg daily  LLQ abdominal pain -CTA of abd/pelvis did not showed acute intraabdominal abnormalities except for very minimal amount of calcified atherosclerotic plaque involving the bilateral common iliac arteries, resulting hemodynamicallysignificant stenosis, not sure if this is related.   -UA: Insignificant growth -Lipase WNL -Currently asymptomatic  Numbness and tingling of right arm:  -No complaints today  Insomnia - Did not  sleep well overnight.  States Ambien has worked in the past.  DC trazodone restart Ambien.   DVT prophylaxis: Lovenox Code Status: Full Family Communication: Husband and son at bedside for plan of care Disposition Plan: Discharge 7/1   Consultants:  Cardiology     Procedures/Significant Events:  CT-head showed no acute intracranial abnormality, and partially empty pituitary sella similar in appearance to prior MRI. Chest CTA: is negative for acute issues.  No dissection.   Abdomen and pelvic CTA: 1. No evidence of abdominal aortic aneurysm. 2. Very minimal amount of calcified atherosclerotic plaque involving the bilateral common iliac arteries, resulting hemodynamicallysignificant stenosis. 3. No acute findings within the abdomen or pelvis. Specifically, no evidence of enteric or urinary obstruction. Normal appearance of theappendix. 6/27 echocardiogram:Left ventricle:  severe LVH.-LVEF =65%-70%.  -Grade 1 diastolic dysfunction - Pulmonary arteries:  PA   peak pressure: 43 mm Hg (S). 6/29 Myoview stress test.-No evidence of ischemia, LVEF normal. Some LV enlargement bynuclear but her LV size by echo is normal. Overall low risk study     I have personally reviewed and interpreted all radiology studies and my findings are as above.  VENTILATOR SETTINGS:    Cultures 6/28 urine positive insignificant growth      Antimicrobials: Anti-infectives (From admission, onward)   None      Devices    LINES / TUBES:      Continuous Infusions:    Objective: Vitals:   06/09/18 0625 06/09/18 0628 06/09/18 0727 06/09/18 0831  BP: (!) 153/66 (!) 153/66 (!) 159/80 (!) 147/78  Pulse:   77   Resp:  16 14   Temp:   98.4 F (36.9 C)   TempSrc:   Oral   SpO2:  96% 98%   Weight:      Height:        Intake/Output Summary (Last 24 hours) at 06/09/2018 0916 Last data filed at 06/08/2018 2100 Gross per 24 hour  Intake 735 ml  Output 1750 ml  Net -1015 ml   Filed  Weights   06/06/18 0539 06/08/18 0417 06/09/18 0258  Weight: (!) 318 lb 3.2 oz (144.3 kg) (!) 315 lb 3.2 oz (143 kg) (!) 315 lb 1.6 oz (142.9 kg)     Physical Exam:  General: A/O x4, No acute respiratory distress Neck:  Negative scars, masses, torticollis, lymphadenopathy, JVD Lungs: Clear to auscultation bilaterally without wheezes or crackles Cardiovascular: Regular rate and rhythm without murmur gallop or rub normal S1 and S2 Abdomen: Morbidly OBESE, negative abdominal pain, nondistended, positive soft, bowel sounds, no rebound, no ascites, no appreciable mass Extremities: No significant cyanosis, clubbing, or edema bilateral lower extremities Skin: Negative rashes, lesions, ulcers Psychiatric:  Negative depression, positive anxiety, negative fatigue, negative mania  Central nervous system:  Cranial nerves II through XII intact, tongue/uvula midline, all extremities muscle strength 5/5, sensation intact throughout,negative dysarthria, negative expressive aphasia, negative receptive aphasia.      Data Reviewed: Care during the described time interval  was provided by me .  I have reviewed this patient's available data, including medical history, events of note, physical examination, and all test results as part of my evaluation.   CBC: Recent Labs  Lab 06/05/18 1550 06/06/18 0931  WBC 7.3 9.3  HGB 13.5 12.8  HCT 42.8 39.7  MCV 84.6 82.9  PLT 300 798   Basic Metabolic Panel: Recent Labs  Lab 06/05/18 1550 06/06/18 0931 06/07/18 0625 06/08/18 0255 06/09/18 0454  NA 139 136 136 138 136  K 4.4 3.3* 3.0* 3.8 3.3*  CL 104 99 100 99 98  CO2 24 26 27 31 28   GLUCOSE 93 117* 105* 97 107*  BUN 13 11 16 12 15   CREATININE 0.60 0.59 0.88 0.84 0.72  CALCIUM 9.5 9.0 9.1 9.0 9.1  MG  --  1.7 1.9 2.0 1.9   GFR: Estimated Creatinine Clearance: 126.2 mL/min (by C-G formula based on SCr of 0.72 mg/dL). Liver Function Tests: No results for input(s): AST, ALT, ALKPHOS, BILITOT,  PROT, ALBUMIN in the last 168 hours. Recent Labs  Lab 06/05/18 2230  LIPASE 22   No results for input(s): AMMONIA in the last 168 hours. Coagulation Profile: No results for input(s): INR, PROTIME in the last 168 hours. Cardiac Enzymes: Recent Labs  Lab 06/05/18 1550 06/05/18 2230 06/06/18 0359 06/06/18 0931  TROPONINI <0.03 <0.03 <0.03 <0.03   BNP (last 3 results) No results for input(s): PROBNP in the last 8760 hours. HbA1C: Recent Labs    06/07/18 0625  HGBA1C 5.4   CBG: No results for input(s): GLUCAP in the last 168 hours. Lipid Profile: Recent Labs    06/07/18 0625  CHOL 183  HDL 53  LDLCALC 118*  TRIG 61  CHOLHDL 3.5   Thyroid Function Tests: Recent Labs    06/07/18 0625  TSH 1.144   Anemia Panel: No results for input(s): VITAMINB12, FOLATE, FERRITIN, TIBC, IRON, RETICCTPCT in the last 72 hours. Urine analysis:    Component Value Date/Time   COLORURINE YELLOW 06/05/2018 2158   APPEARANCEUR CLEAR 06/05/2018 2158   LABSPEC 1.042 (H) 06/05/2018 2158   PHURINE 6.0 06/05/2018 2158   GLUCOSEU NEGATIVE 06/05/2018 2158   GLUCOSEU NEGATIVE 05/21/2018 1523   HGBUR NEGATIVE 06/05/2018 2158   BILIRUBINUR NEGATIVE 06/05/2018 2158   KETONESUR 20 (A) 06/05/2018 2158   PROTEINUR 30 (A) 06/05/2018 2158   UROBILINOGEN 0.2 05/21/2018 1523   NITRITE NEGATIVE 06/05/2018 2158   LEUKOCYTESUR NEGATIVE 06/05/2018 2158   Sepsis Labs: @LABRCNTIP (procalcitonin:4,lacticidven:4)  ) Recent Results (from the past 240 hour(s))  Urine Culture     Status: Abnormal   Collection Time: 06/07/18  4:54 AM  Result Value Ref Range Status   Specimen Description URINE, RANDOM  Final   Special Requests NONE  Final   Culture (A)  Final    <10,000 COLONIES/mL INSIGNIFICANT GROWTH Performed at [facility], [place] [address], [place],  [address]    Report Status 06/09/2018 FINAL  Final         Radiology Studies: Nm Myocar Multi W/spect W/wall Motion / Ef  Result  Date: 06/08/2018 CLINICAL DATA:  Chest pain EXAM: MYOCARDIAL IMAGING WITH SPECT (REST AND PHARMACOLOGIC-STRESS - 2 DAY PROTOCOL) GATED LEFT VENTRICULAR WALL MOTION STUDY LEFT VENTRICULAR EJECTION FRACTION TECHNIQUE: Standard myocardial SPECT imaging was performed after resting intravenous injection of 30 mCi Tc-57m tetrofosmin. Subsequently, on a second day, intravenous infusion of Lexiscan was performed under the supervision of the Cardiology staff. At peak effect of the drug, 30  mCi Tc-80m tetrofosmin was injected intravenously and standard myocardial SPECT imaging was performed. Quantitative gated imaging was also performed to evaluate left ventricular wall motion, and estimate left ventricular ejection fraction. COMPARISON:  None. FINDINGS: Perfusion: No decreased activity in the left ventricle on stress imaging to suggest reversible ischemia or infarction. Wall Motion: Normal left ventricular wall motion. Moderate left ventricular dilation. Left Ventricular Ejection Fraction: 57 % End diastolic volume 478 ml End systolic volume 54 ml IMPRESSION: 1. No reversible ischemia or infarction. 2. Moderate LVE with normal wall motion. 3. Left ventricular ejection fraction 57% 4. Non invasive risk stratification*: Indeterminate *2012 Appropriate Use Criteria for Coronary Revascularization Focused Update: J Am Coll Cardiol. 2956;21(3):086-578. http://content.[email or web address].aspx?articleid=[email or web address] Electronically Signed   By: Lucrezia Europe M.D.   On: 06/08/2018 13:10        Scheduled Meds: . amLODipine  10 mg Oral Daily  . aspirin EC  325 mg Oral Daily  . atorvastatin  40 mg Oral q1800  . cholecalciferol  2,000 Units Oral Daily  . enoxaparin (LOVENOX) injection  40 mg Subcutaneous QHS  . furosemide  60 mg Intravenous BID  . hydrALAZINE  25 mg Oral Q8H  . irbesartan  300 mg Oral Daily  . levothyroxine  25 mcg Oral QAC breakfast  .  morphine injection  2 mg Intravenous Once  . multivitamin with minerals   1 tablet Oral Daily  . nadolol  160 mg Oral Daily  . polyethylene glycol  17 g Oral BID  . SUMAtriptan  6 mg Subcutaneous Once  . traZODone  25 mg Oral QHS   Continuous Infusions:    LOS: 2 days    Time spent: 40 minutes    Avaline Stillson, Geraldo Docker, MD [facility] Pager [phone]   If 7PM-7AM, please contact night-coverage [email or web address] Password [facility] 06/09/2018, 9:16 AM

## 2018-06-10 ENCOUNTER — Inpatient Hospital Stay (HOSPITAL_COMMUNITY): Payer: BLUE CROSS/BLUE SHIELD

## 2018-06-10 DIAGNOSIS — E039 Hypothyroidism, unspecified: Secondary | ICD-10-CM

## 2018-06-10 DIAGNOSIS — I160 Hypertensive urgency: Principal | ICD-10-CM

## 2018-06-10 DIAGNOSIS — E7849 Other hyperlipidemia: Secondary | ICD-10-CM

## 2018-06-10 DIAGNOSIS — R109 Unspecified abdominal pain: Secondary | ICD-10-CM

## 2018-06-10 DIAGNOSIS — I10 Essential (primary) hypertension: Secondary | ICD-10-CM

## 2018-06-10 LAB — BASIC METABOLIC PANEL
Anion gap: 9 (ref 5–15)
BUN: 11 mg/dL (ref 6–20)
CO2: 31 mmol/L (ref 22–32)
Calcium: 9.3 mg/dL (ref 8.9–10.3)
Chloride: 95 mmol/L — ABNORMAL LOW (ref 98–111)
Creatinine, Ser: 0.61 mg/dL (ref 0.44–1.00)
GFR calc Af Amer: >60 mL/min (ref >60)
GFR calc non Af Amer: >60 mL/min (ref >60)
Glucose, Bld: 113 mg/dL — ABNORMAL HIGH (ref 70–99)
Potassium: 3.2 mmol/L — ABNORMAL LOW (ref 3.5–5.1)
Sodium: 135 mmol/L (ref 135–145)

## 2018-06-10 LAB — MAGNESIUM: Magnesium: 2.1 mg/dL (ref 1.7–2.4)

## 2018-06-10 MED ORDER — FUROSEMIDE 80 MG PO TABS: 80.0000 mg | ORAL_TABLET | Freq: Every day | ORAL | 1 refills | Status: DC

## 2018-06-10 MED ORDER — HYDRALAZINE HCL 50 MG PO TABS: 50.0000 mg | ORAL_TABLET | Freq: Three times a day (TID) | ORAL | 0 refills | Status: DC

## 2018-06-10 MED ORDER — NADOLOL 80 MG PO TABS: 160.0000 mg | ORAL_TABLET | Freq: Every day | ORAL | 0 refills | Status: DC

## 2018-06-10 MED ORDER — ATORVASTATIN CALCIUM 80 MG PO TABS: 80.0000 mg | ORAL_TABLET | Freq: Every day | ORAL | 0 refills | Status: DC

## 2018-06-10 MED ORDER — AMLODIPINE BESYLATE 10 MG PO TABS: 10.0000 mg | ORAL_TABLET | Freq: Every day | ORAL | 0 refills | Status: AC

## 2018-06-10 MED ORDER — POTASSIUM CHLORIDE CRYS ER 20 MEQ PO TBCR: 40.0000 meq | EXTENDED_RELEASE_TABLET | Freq: Every day | ORAL | Status: DC

## 2018-06-10 MED ORDER — IRBESARTAN 300 MG PO TABS: 300.0000 mg | ORAL_TABLET | Freq: Every day | ORAL | 0 refills | Status: DC

## 2018-06-10 MED ORDER — POTASSIUM CHLORIDE CRYS ER 20 MEQ PO TBCR: 20.0000 meq | EXTENDED_RELEASE_TABLET | Freq: Every day | ORAL | 0 refills | Status: DC

## 2018-06-10 NOTE — Progress Notes (Signed)
Preliminary results by tech - Renal Duplex Completed. Technically difficult study due to patient's body habitus.  Right main renal artery 1-59% stenosis in the proximal renal artery. No evidence of a significant stenosis in the left main renal artery. Oda Cogan, BS, RDMS, RVT

## 2018-06-10 NOTE — Discharge Summary (Signed)
DISCHARGE SUMMARY  Chelsea Thompson  MR#: [id number]  DOB:[birth year]  Date of Admission: 06/05/2018 Date of Discharge: 06/10/2018  Attending Physician:Dawood Spitler Hennie Duos, MD  Patient's QQP:YPPJ, Hunt Oris, MD  Consults:  [facility] Cardiology   Disposition: D/C home   Follow-up Appts: Follow-up Information    Biagio Borg, MD Follow up in 1 week(s).   Specialties:  Internal Medicine, Radiology Contact information: [address] [place] [address] [phone]        Josue Hector, MD .   Specialty:  Cardiology Contact information: [phone] [address] [place] [address] [phone]           Tests Needing Follow-up: -assess renal fxn and K+ - pt on ARB/ACEi and diuretic -assess BP control - further medication titration likely to be required -f/u LFTs and screen for side effects in pt newly started on Lipitor  -F/U results of Renal Artery [place] to r/o RAS  Discharge Diagnoses: HTN urgency Chest pain LLQ Abdominal pain Chronic diastolic CHF Hypothyroidism Hypokalemia   Initial presentation: 47yo F w/ a Hx of Depression with Anxiety, HTN, HLD, GERD, Hypothyroidism, OSA/OHS not on CPAP, Obesity, and a Pituitary Adenoma who presented with 24hrs of chest pain and 2 weeks of left lower abdominal pain.  She had difficult to control blood pressure as an outpatient for 6 months.  In the ED she was found to havea blood pressure of 241/91, WBC 7.3, negative troponin, O2 sat 87-100% on RA, and a negative CXR.  Hospital Course:  Chest pain S/p stress test per Cards w/o evidence of ischemia - TTE w/ preserved EF and no WMA - no further cardiac w/u planned as inpt  HTN urgency On admission BP 241/91 - BP better controlled at time of d/c - will need ongoing outpt med titration to allow for slow further improvement in BP control   LLQ Abdominal pain CTA of abd/pelvis w/o clinically significant acute findings - lipase normal - UA unrevealing    Chronic diastolic CHF Well compensated at time of d/c   Hypothyroidism TSH at goal at 1.144  Hypokalemia  Due to use of diuretic - supplement and follow  HLD Lipitor newly started during this admit   Allergies as of 06/10/2018      Reactions   Peanut-containing Drug Products Anaphylaxis   Shellfish Allergy Anaphylaxis      Medication List    STOP taking these medications   hydrochlorothiazide 25 MG tablet Commonly known as:  HYDRODIURIL   SUMAtriptan 50 MG tablet Commonly known as:  IMITREX   telmisartan 80 MG tablet Commonly known as:  MICARDIS Replaced by:  irbesartan 300 MG tablet     TAKE these medications   amLODipine 10 MG tablet Commonly known as:  NORVASC Take 1 tablet (10 mg total) by mouth daily. Start taking on:  06/11/2018 What changed:    medication strength  how much to take   atorvastatin 80 MG tablet Commonly known as:  LIPITOR Take 1 tablet (80 mg total) by mouth daily at 6 PM.   celecoxib 200 MG capsule Commonly known as:  CELEBREX Take 1 capsule (200 mg total) by mouth 2 (two) times daily as needed.   cholecalciferol 1000 units tablet Commonly known as:  VITAMIN D Take 2,000 Units by mouth daily.   cyclobenzaprine 5 MG tablet Commonly known as:  FLEXERIL TAKE 1 TABLET BY MOUTH THREE TIMES A DAY AS NEEDED FOR MUSCLE SPASMS   furosemide 80 MG tablet  Commonly known as:  LASIX Take 1 tablet (80 mg total) by mouth daily.   hydrALAZINE 50 MG tablet Commonly known as:  APRESOLINE Take 1 tablet (50 mg total) by mouth every 8 (eight) hours.   irbesartan 300 MG tablet Commonly known as:  AVAPRO Take 1 tablet (300 mg total) by mouth daily. Start taking on:  06/11/2018 Replaces:  telmisartan 80 MG tablet   levothyroxine 25 MCG tablet Commonly known as:  SYNTHROID, LEVOTHROID TAKE 1 TABLET (25 MCG TOTAL) BY MOUTH DAILY BEFORE BREAKFAST.   multivitamin with minerals Tabs tablet Take 1 tablet by mouth daily.   nadolol 80 MG  tablet Commonly known as:  CORGARD Take 2 tablets (160 mg total) by mouth daily. Start taking on:  06/11/2018 What changed:  how much to take   potassium chloride SA 20 MEQ tablet Commonly known as:  K-DUR,KLOR-CON Take 1 tablet (20 mEq total) by mouth daily. Start taking on:  06/11/2018 What changed:    medication strength  how much to take   SYSTANE 0.4-0.3 % Soln Generic drug:  Polyethyl Glycol-Propyl Glycol Place 1 drop into both eyes as needed (dry eyes).   traMADol 50 MG tablet Commonly known as:  ULTRAM Take 1 tablet (50 mg total) by mouth every 6 (six) hours as needed. What changed:    how much to take  reasons to take this   zolpidem 10 MG tablet Commonly known as:  AMBIEN Take 10 mg by mouth at bedtime as needed for sleep.       Day of Discharge BP (!) 143/59   Pulse 74   Temp 98.1 F (36.7 C) (Oral)   Resp 20   Ht 5\' 4"  (1.626 m)   Wt (!) 142.5 kg (314 lb 1.6 oz)   LMP 05/01/2018 Comment: pt. states no chance  SpO2 98%   BMI 53.92 kg/m   Physical Exam: General: No acute respiratory distress Lungs: Clear to auscultation bilaterally without wheezes or crackles Cardiovascular: Regular rate and rhythm without murmur gallop or rub normal S1 and S2 Abdomen: Nontender, nondistended, soft, bowel sounds positive, no rebound, no ascites, no appreciable mass Extremities: No significant cyanosis, clubbing, or edema bilateral lower extremities  Basic Metabolic Panel: Recent Labs  Lab 06/06/18 0931 06/07/18 0625 06/08/18 0255 06/09/18 0454 06/10/18 0433  NA 136 136 138 136 135  K 3.3* 3.0* 3.8 3.3* 3.2*  CL 99 100 99 98 95*  CO2 26 27 31 28 31   GLUCOSE 117* 105* 97 107* 113*  BUN 11 16 12 15 11   CREATININE 0.59 0.88 0.84 0.72 0.61  CALCIUM 9.0 9.1 9.0 9.1 9.3  MG 1.7 1.9 2.0 1.9 2.1    Recent Labs  Lab 06/05/18 2230  LIPASE 22   CBC: Recent Labs  Lab 06/05/18 1550 06/06/18 0931  WBC 7.3 9.3  HGB 13.5 12.8  HCT 42.8 39.7  MCV 84.6 82.9   PLT 300 318    Cardiac Enzymes: Recent Labs  Lab 06/05/18 1550 06/05/18 2230 06/06/18 0359 06/06/18 0931  TROPONINI <0.03 <0.03 <0.03 <0.03    Recent Results (from the past 240 hour(s))  Urine Culture     Status: Abnormal   Collection Time: 06/07/18  4:54 AM  Result Value Ref Range Status   Specimen Description URINE, RANDOM  Final   Special Requests NONE  Final   Culture (A)  Final    <10,000 COLONIES/mL INSIGNIFICANT GROWTH Performed at [facility], [address], [place], [place] [address]  Report Status 06/09/2018 FINAL  Final     Time spent in discharge (includes decision making & examination of pt): 35 minutes  06/10/2018, 3:48 PM   Cherene Altes, MD [facility] Office  [phone] Pager [phone]  On-Call/Text Page:      [email or web address]      password [facility]

## 2018-06-10 NOTE — Plan of Care (Signed)

## 2018-06-10 NOTE — Discharge Instructions (Signed)

## 2018-06-10 NOTE — Progress Notes (Signed)
Discharge instructions, RX's and follow up appts explained and provided to patient and c/g verbalized understanding. Patient left floor via wheelchair accompanied by staff no c/o pain or shortness of breath at d/c.  Macy Polio, Tivis Ringer, RN

## 2018-06-11 ENCOUNTER — Other Ambulatory Visit: Payer: Self-pay | Admitting: Obstetrics and Gynecology

## 2018-06-18 ENCOUNTER — Telehealth: Payer: Self-pay

## 2018-06-18 ENCOUNTER — Ambulatory Visit: Payer: BLUE CROSS/BLUE SHIELD

## 2018-06-18 VITALS — BP 165/100 | HR 86

## 2018-06-18 DIAGNOSIS — Z0130 Encounter for examination of blood pressure without abnormal findings: Secondary | ICD-10-CM

## 2018-06-18 NOTE — Progress Notes (Addendum)
Note put in chart regarding B/P today. She has appointment to come and see you tomorrow at 4:20 pm.  Medical screening examination/treatment/procedure(s) were performed by non-physician practitioner and as supervising physician I was immediately available for consultation/collaboration. I agree with above. Cathlean Cower, MD

## 2018-06-18 NOTE — Telephone Encounter (Signed)
Patient seen in office today for B/P reading. Reading today was 165/100. Patient also just discharged from hospital on 06/10/18 and was told to make follow up apt with Dr. Jenny Reichmann. Spoke with Shirron today and was advised to put patient on schedule for tomorrow to be seen by Dr. Jenny Reichmann.  Apt made for tomorrow at 4:20 pm as this was the only appointment available.

## 2018-06-18 NOTE — Telephone Encounter (Signed)
Patient seen in office today. She wanted me to let you know how grateful she was for you taking such good care of her and sending her over to the ER when you did. She was very appreciative and just wanted to say thank you. She also asked that I give you a bid hug also :)

## 2018-06-19 ENCOUNTER — Encounter: Payer: Self-pay | Admitting: Internal Medicine

## 2018-06-19 ENCOUNTER — Ambulatory Visit: Payer: BLUE CROSS/BLUE SHIELD | Admitting: Internal Medicine

## 2018-06-19 VITALS — BP 164/98 | HR 92 | Temp 98.2°F | Ht 64.0 in | Wt 313.0 lb

## 2018-06-19 DIAGNOSIS — F419 Anxiety disorder, unspecified: Secondary | ICD-10-CM

## 2018-06-19 DIAGNOSIS — R7302 Impaired glucose tolerance (oral): Secondary | ICD-10-CM

## 2018-06-19 DIAGNOSIS — I10 Essential (primary) hypertension: Secondary | ICD-10-CM

## 2018-06-19 DIAGNOSIS — I701 Atherosclerosis of renal artery: Secondary | ICD-10-CM

## 2018-06-19 MED ORDER — HYDRALAZINE HCL 100 MG PO TABS: 100.0000 mg | ORAL_TABLET | Freq: Three times a day (TID) | ORAL | 11 refills | Status: DC

## 2018-06-19 MED ORDER — ALPRAZOLAM 0.25 MG PO TABS
0.2500 mg | ORAL_TABLET | Freq: Two times a day (BID) | ORAL | 1 refills | Status: DC | PRN
Start: 2018-06-19 — End: 2018-10-11

## 2018-06-19 MED ORDER — TRAMADOL HCL 50 MG PO TABS: 25.0000 mg | ORAL_TABLET | Freq: Four times a day (QID) | ORAL | 2 refills | Status: DC | PRN

## 2018-06-19 NOTE — Assessment & Plan Note (Signed)
For limited low dose xanax prn, cont other tx, declines counseling refill

## 2018-06-19 NOTE — Assessment & Plan Note (Signed)
Mod to severe uncontrolled on 5 vasoactive meds; will increase the hydralazine to 100 tid, cont all other meds, refer renal for hopeful better control

## 2018-06-19 NOTE — Patient Instructions (Addendum)
Ok to increase the hydralazine to 100 mg three times per day  Please take all new medication as prescribed - the xanax  Please continue all other medications as before, and refills have been done if requested - the tramadol  Please have the pharmacy call with any other refills you may need.  Please continue your efforts at being more active, low cholesterol diet, and weight control..  Please keep your appointments with your specialists as you may have planned - Dr Ileene Rubens, Dr Collene Mares, Dr Johnsie Cancel, and [place] will be contacted regarding the referral for: Nephrology  OK to cancel the July 26 appt

## 2018-06-19 NOTE — Assessment & Plan Note (Signed)
Lab Results  Component Value Date   HGBA1C 5.4 06/07/2018  stable overall by history and exam, recent data reviewed with pt, and pt to continue medical treatment as before,  to f/u any worsening symptoms or concerns

## 2018-06-19 NOTE — Progress Notes (Signed)
Subjective:    Patient ID: Chelsea Thompson, female    DOB: [birth year], 47 y.o.   MRN: [id number]  HPI  Here to f/u post hospn for uncontrolled HTN, somewhat improved July 1 at d/c but since has been elevated again, despite good med compliance,  Pt denies chest pain, increased sob or doe, wheezing, orthopnea, PND, increased LE swelling, palpitations, dizziness or syncope.  Pt denies new neurological symptoms such as new headache, or facial or extremity weakness or numbness   Pt denies polydipsia, polyuria, or low sugar symptoms such as weakness or confusion improved with po intake.  Pt states overall good compliance with meds, trying to follow lower cholesterol, diabetic diet, wt overall stable but little exercise however.     Did have RAS u/s study c /w 1-59% bilat renal artery stenosis. Pt continues to have recurring LBP without change in severity, bowel or bladder change, fever, wt loss,  worsening LE pain/numbness/weakness, gait change or falls, asks for pain med refill.  Denies worsening depressive symptoms, suicidal ideation, or panic; has ongoing anxiety, worse recently. BP Readings from Last 3 Encounters:  06/19/18 (!) 164/98  06/18/18 (!) 165/100  06/10/18 (!) 143/59   Past Medical History:  Diagnosis Date  . Allergic rhinitis   . Chronic lower back pain   . Complication of anesthesia    "it takes away my memory" (06/06/2018)  . GERD (gastroesophageal reflux disease) 04/08/2016  . Glucose intolerance (impaired glucose tolerance)   . Headache    "maybe monthly" (06/06/2018)  . HTN (hypertension)   . Hyperlipidemia   . Hypothyroidism    DR [name]  . Impaired glucose tolerance 10/04/2011  . Obesity   . OSA on CPAP 10/08/2014   "not using this right now" (06/06/2018)  . Pituitary adenoma ([place])   . Shortness of breath    Past Surgical History:  Procedure Laterality Date  . ANTERIOR CERVICAL DECOMP/DISCECTOMY FUSION  11/18/2012   Procedure: ANTERIOR CERVICAL  DECOMPRESSION/DISCECTOMY FUSION 1 LEVEL;  Surgeon: Hosie Spangle, MD;  Location: [facility];  Service: Neurosurgery;  Laterality: N/A;  Cervical five-six Anterior cervical decompression/diskectomy/fusion   . BACK SURGERY    . DILATION AND CURETTAGE OF UTERUS  07/2010   Hysteroscopy NEG w/Dr Mezer  . REDUCTION MAMMAPLASTY Bilateral 2001    reports that she has never smoked. She has never used smokeless tobacco. She reports that she does not drink alcohol or use drugs. family history includes Cancer in her brother; Hypertension in her brother, mother, and sister; Other (age of onset: 38) in her mother. Allergies  Allergen Reactions  . Peanut-Containing Drug Products Anaphylaxis  . Shellfish Allergy Anaphylaxis   Current Outpatient Medications on File Prior to Visit  Medication Sig Dispense Refill  . amLODipine (NORVASC) 10 MG tablet Take 1 tablet (10 mg total) by mouth daily. 30 tablet 0  . atorvastatin (LIPITOR) 80 MG tablet Take 1 tablet (80 mg total) by mouth daily at 6 PM. 30 tablet 0  . celecoxib (CELEBREX) 200 MG capsule Take 1 capsule (200 mg total) by mouth 2 (two) times daily as needed. 60 capsule 5  . cholecalciferol (VITAMIN D) 1000 UNITS tablet Take 2,000 Units by mouth daily.    . cyclobenzaprine (FLEXERIL) 5 MG tablet TAKE 1 TABLET BY MOUTH THREE TIMES A DAY AS NEEDED FOR MUSCLE SPASMS 60 tablet 1  . furosemide (LASIX) 80 MG tablet Take 1 tablet (80 mg total) by mouth daily. 30 tablet 1  . irbesartan (AVAPRO) 300 MG  tablet Take 1 tablet (300 mg total) by mouth daily. 30 tablet 0  . levothyroxine (SYNTHROID, LEVOTHROID) 25 MCG tablet TAKE 1 TABLET (25 MCG TOTAL) BY MOUTH DAILY BEFORE BREAKFAST. 90 tablet 3  . Multiple Vitamin (MULTIVITAMIN WITH MINERALS) TABS Take 1 tablet by mouth daily.    . nadolol (CORGARD) 80 MG tablet Take 2 tablets (160 mg total) by mouth daily. 60 tablet 0  . Polyethyl Glycol-Propyl Glycol (SYSTANE) 0.4-0.3 % SOLN Place 1 drop into both eyes as  needed (dry eyes).    . potassium chloride SA (K-DUR,KLOR-CON) 20 MEQ tablet Take 1 tablet (20 mEq total) by mouth daily. 30 tablet 0  . zolpidem (AMBIEN) 10 MG tablet Take 10 mg by mouth at bedtime as needed for sleep.    . [DISCONTINUED] fexofenadine (ALLEGRA) 180 MG tablet Take 180 mg by mouth daily.       No current facility-administered medications on file prior to visit.    Review of Systems  Constitutional: Negative for other unusual diaphoresis or sweats HENT: Negative for ear discharge or swelling Eyes: Negative for other worsening visual disturbances Respiratory: Negative for stridor or other swelling  Gastrointestinal: Negative for worsening distension or other blood Genitourinary: Negative for retention or other urinary change Musculoskeletal: Negative for other MSK pain or swelling Skin: Negative for color change or other new lesions Neurological: Negative for worsening tremors and other numbness  Psychiatric/Behavioral: Negative for worsening agitation or other fatigue All other system neg per pt    Objective:   Physical Exam BP (!) 164/98   Pulse 92   Temp 98.2 F (36.8 C) (Oral)   Ht 5\' 4"  (1.626 m)   Wt (!) 313 lb (142 kg)   SpO2 98%   BMI 53.73 kg/m  VS noted, supermorbid obese Constitutional: Pt appears in NAD HENT: Head: NCAT.  Right Ear: External ear normal.  Left Ear: External ear normal.  Eyes: . Pupils are equal, round, and reactive to light. Conjunctivae and EOM are normal Nose: without d/c or deformity Neck: Neck supple. Gross normal ROM Cardiovascular: Normal rate and regular rhythm.   Pulmonary/Chest: Effort normal and breath sounds without rales or wheezing.  Abd:  Soft, NT, ND, + BS, no organomegaly Neurological: Pt is alert. At baseline orientation, motor grossly intact Skin: Skin is warm. No rashes, other new lesions, no LE edema Psychiatric: Pt behavior is normal without agitation , 1-2+ nervous No other exam findings Lab Results    Component Value Date   WBC 9.3 06/06/2018   HGB 12.8 06/06/2018   HCT 39.7 06/06/2018   PLT 318 06/06/2018   GLUCOSE 113 (H) 06/10/2018   CHOL 183 06/07/2018   TRIG 61 06/07/2018   HDL 53 06/07/2018   LDLDIRECT 181.7 08/27/2014   LDLCALC 118 (H) 06/07/2018   ALT 30 02/16/2017   AST 18 02/16/2017   NA 135 06/10/2018   K 3.2 (L) 06/10/2018   CL 95 (L) 06/10/2018   CREATININE 0.61 06/10/2018   BUN 11 06/10/2018   CO2 31 06/10/2018   TSH 1.144 06/07/2018   HGBA1C 5.4 06/07/2018           Assessment & Plan:

## 2018-06-25 ENCOUNTER — Ambulatory Visit: Payer: BLUE CROSS/BLUE SHIELD

## 2018-06-25 ENCOUNTER — Telehealth: Payer: Self-pay | Admitting: Internal Medicine

## 2018-06-25 ENCOUNTER — Other Ambulatory Visit: Payer: Self-pay | Admitting: Internal Medicine

## 2018-06-25 VITALS — BP 146/86

## 2018-06-25 DIAGNOSIS — Z0130 Encounter for examination of blood pressure without abnormal findings: Secondary | ICD-10-CM

## 2018-06-25 NOTE — Telephone Encounter (Signed)
03/25/2018 90# 

## 2018-06-25 NOTE — Telephone Encounter (Signed)
Patient states she was seen in the hospital and given nadolol.  States that she was seen after the visit but did not get this filled.  Patient would like to know if [facility]re Dross could fill this medication since Jenny Reichmann is out.  Patient uses [facility] on [address].

## 2018-06-26 ENCOUNTER — Other Ambulatory Visit: Payer: Self-pay | Admitting: Family

## 2018-06-26 MED ORDER — NADOLOL 80 MG PO TABS: 160.0000 mg | ORAL_TABLET | Freq: Every day | ORAL | 0 refills | Status: AC

## 2018-06-26 NOTE — Telephone Encounter (Signed)
Sending as requested; keep planned blood pressure follow-up with Dr. Jenny Reichmann

## 2018-06-26 NOTE — Telephone Encounter (Signed)
Called and left message for patient with info. 

## 2018-06-27 ENCOUNTER — Other Ambulatory Visit: Payer: Self-pay | Admitting: Gastroenterology

## 2018-06-27 ENCOUNTER — Ambulatory Visit: Payer: BLUE CROSS/BLUE SHIELD

## 2018-07-04 ENCOUNTER — Ambulatory Visit: Payer: BLUE CROSS/BLUE SHIELD

## 2018-07-04 VITALS — BP 132/76

## 2018-07-04 DIAGNOSIS — I10 Essential (primary) hypertension: Secondary | ICD-10-CM

## 2018-07-05 ENCOUNTER — Ambulatory Visit: Payer: BLUE CROSS/BLUE SHIELD | Admitting: Internal Medicine

## 2018-07-10 ENCOUNTER — Ambulatory Visit: Payer: BLUE CROSS/BLUE SHIELD

## 2018-08-01 ENCOUNTER — Other Ambulatory Visit: Payer: Self-pay | Admitting: Internal Medicine

## 2018-08-27 ENCOUNTER — Other Ambulatory Visit: Payer: Self-pay

## 2018-08-27 ENCOUNTER — Encounter (HOSPITAL_COMMUNITY)
Admission: RE | Disposition: A | Discharge status: Home / Self Care | Payer: Self-pay | Source: Ambulatory Visit | Attending: Gastroenterology

## 2018-08-27 ENCOUNTER — Ambulatory Visit (HOSPITAL_COMMUNITY): Payer: BLUE CROSS/BLUE SHIELD | Admitting: Anesthesiology

## 2018-08-27 ENCOUNTER — Encounter (HOSPITAL_COMMUNITY): Payer: Self-pay

## 2018-08-27 ENCOUNTER — Ambulatory Visit (HOSPITAL_COMMUNITY)
Admission: RE | Admit: 2018-08-27 | Discharge: 2018-08-27 | Disposition: A | Discharge status: Home / Self Care | Payer: BLUE CROSS/BLUE SHIELD | Source: Ambulatory Visit | Attending: Gastroenterology | Admitting: Gastroenterology

## 2018-08-27 DIAGNOSIS — J309 Allergic rhinitis, unspecified: Secondary | ICD-10-CM | POA: Diagnosis not present

## 2018-08-27 DIAGNOSIS — Z91013 Allergy to seafood: Secondary | ICD-10-CM | POA: Insufficient documentation

## 2018-08-27 DIAGNOSIS — F329 Major depressive disorder, single episode, unspecified: Secondary | ICD-10-CM | POA: Insufficient documentation

## 2018-08-27 DIAGNOSIS — D352 Benign neoplasm of pituitary gland: Secondary | ICD-10-CM | POA: Insufficient documentation

## 2018-08-27 DIAGNOSIS — Z6841 Body Mass Index (BMI) 40.0 and over, adult: Secondary | ICD-10-CM | POA: Diagnosis not present

## 2018-08-27 DIAGNOSIS — K210 Gastro-esophageal reflux disease with esophagitis: Secondary | ICD-10-CM | POA: Diagnosis not present

## 2018-08-27 DIAGNOSIS — Z8249 Family history of ischemic heart disease and other diseases of the circulatory system: Secondary | ICD-10-CM | POA: Diagnosis not present

## 2018-08-27 DIAGNOSIS — G8929 Other chronic pain: Secondary | ICD-10-CM | POA: Insufficient documentation

## 2018-08-27 DIAGNOSIS — R1032 Left lower quadrant pain: Secondary | ICD-10-CM | POA: Diagnosis not present

## 2018-08-27 DIAGNOSIS — R194 Change in bowel habit: Secondary | ICD-10-CM | POA: Diagnosis present

## 2018-08-27 DIAGNOSIS — M545 Low back pain: Secondary | ICD-10-CM | POA: Insufficient documentation

## 2018-08-27 DIAGNOSIS — Z981 Arthrodesis status: Secondary | ICD-10-CM | POA: Insufficient documentation

## 2018-08-27 DIAGNOSIS — Z79899 Other long term (current) drug therapy: Secondary | ICD-10-CM | POA: Diagnosis not present

## 2018-08-27 DIAGNOSIS — E785 Hyperlipidemia, unspecified: Secondary | ICD-10-CM | POA: Diagnosis not present

## 2018-08-27 DIAGNOSIS — R51 Headache: Secondary | ICD-10-CM | POA: Diagnosis not present

## 2018-08-27 DIAGNOSIS — Z8042 Family history of malignant neoplasm of prostate: Secondary | ICD-10-CM | POA: Diagnosis not present

## 2018-08-27 DIAGNOSIS — G4733 Obstructive sleep apnea (adult) (pediatric): Secondary | ICD-10-CM | POA: Insufficient documentation

## 2018-08-27 DIAGNOSIS — I10 Essential (primary) hypertension: Secondary | ICD-10-CM | POA: Diagnosis not present

## 2018-08-27 DIAGNOSIS — F419 Anxiety disorder, unspecified: Secondary | ICD-10-CM | POA: Diagnosis not present

## 2018-08-27 DIAGNOSIS — R1310 Dysphagia, unspecified: Secondary | ICD-10-CM | POA: Insufficient documentation

## 2018-08-27 DIAGNOSIS — E039 Hypothyroidism, unspecified: Secondary | ICD-10-CM | POA: Insufficient documentation

## 2018-08-27 DIAGNOSIS — E7439 Other disorders of intestinal carbohydrate absorption: Secondary | ICD-10-CM | POA: Diagnosis not present

## 2018-08-27 DIAGNOSIS — Z91010 Allergy to peanuts: Secondary | ICD-10-CM | POA: Insufficient documentation

## 2018-08-27 HISTORY — PX: ESOPHAGOGASTRODUODENOSCOPY (EGD) WITH PROPOFOL: SHX5813

## 2018-08-27 HISTORY — PX: COLONOSCOPY WITH PROPOFOL: SHX5780

## 2018-08-27 LAB — PREGNANCY, URINE: Preg Test, Ur: NEGATIVE

## 2018-08-27 SURGERY — ESOPHAGOGASTRODUODENOSCOPY (EGD) WITH PROPOFOL
Anesthesia: Monitor Anesthesia Care

## 2018-08-27 MED ORDER — PROPOFOL 500 MG/50ML IV EMUL
INTRAVENOUS | Status: DC | PRN
  Administered 2018-08-27: 125 ug/kg/min via INTRAVENOUS

## 2018-08-27 MED ORDER — PROPOFOL 10 MG/ML IV BOLUS
INTRAVENOUS | Status: AC
  Filled 2018-08-27: qty 60

## 2018-08-27 MED ORDER — LIDOCAINE 2% (20 MG/ML) 5 ML SYRINGE
INTRAMUSCULAR | Status: DC | PRN
  Administered 2018-08-27: 100 mg via INTRAVENOUS

## 2018-08-27 MED ORDER — PROPOFOL 10 MG/ML IV BOLUS
INTRAVENOUS | Status: DC | PRN
  Administered 2018-08-27 (×4): 20 mg via INTRAVENOUS

## 2018-08-27 MED ORDER — SODIUM CHLORIDE 0.9 % IV SOLN: INTRAVENOUS | Status: DC

## 2018-08-27 MED ORDER — PROPOFOL 10 MG/ML IV BOLUS
INTRAVENOUS | Status: AC
  Filled 2018-08-27: qty 20

## 2018-08-27 MED ORDER — LACTATED RINGERS IV SOLN
INTRAVENOUS | Status: DC
  Administered 2018-08-27: 1000 mL via INTRAVENOUS

## 2018-08-27 SURGICAL SUPPLY — 25 items

## 2018-08-27 NOTE — Transfer of Care (Signed)
Immediate Anesthesia Transfer of Care Note  Patient: Chelsea Thompson  Procedure(s) Performed: ESOPHAGOGASTRODUODENOSCOPY (EGD) WITH PROPOFOL (N/A ) COLONOSCOPY WITH PROPOFOL (N/A )  Patient Location: PACU and Endoscopy Unit  Anesthesia Type:MAC  Level of Consciousness: awake and patient cooperative  Airway & Oxygen Therapy: Patient Spontanous Breathing and Patient connected to nasal cannula oxygen  Post-op Assessment: Report given to RN and Post -op Vital signs reviewed and stable  Post vital signs: Reviewed and stable  Last Vitals:  Vitals Value Taken Time  BP 95/72 08/27/2018  8:05 AM  Temp    Pulse 84 08/27/2018  8:06 AM  Resp 18 08/27/2018  8:06 AM  SpO2 97 % 08/27/2018  8:06 AM  Vitals shown include unvalidated device data.  Last Pain:  Vitals:   08/27/18 0636  TempSrc: Oral         Complications: No apparent anesthesia complications

## 2018-08-27 NOTE — Op Note (Addendum)
[facility] Patient Name: Chelsea Thompson Procedure Date: 08/27/2018 MRN: [id number] Attending MD: Juanita Craver , MD Date of Birth: 03-Dec-1971 CSN: [id number] Age: 47 Admit Type: Outpatient Procedure:                Diagnostic EGD. Indications:              Dysphagia, Nausea. Providers:                Juanita Craver, MD, Carolynn Comment RN, Charolette Child, Technician, Dione Booze, CRNA Referring MD:             Servando Salina MD, Charolette Forward MD Medicines:                Monitored Anesthesia Care Complications:            No immediate complications. Estimated Blood Loss:     Estimated blood loss was minimal. Procedure:                Pre-anesthesia assessment: - Prior to the                            procedure, a history and physical was performed,                            and patient medications and allergies were                            reviewed. The patient's tolerance of previous                            anesthesia was also reviewed. The risks and                            benefits of the procedure and the sedation options                            and risks were discussed with the patient. All                            questions were answered, and informed consent was                            obtained. Prior anticoagulants: The patient has                            taken Celecoxib 1 day prior to the procedure. ASA                            Grade Assessment: II - A patient with severe                            systemic disease. After reviewing the risks and  benefits, the patient was deemed in satisfactory                            condition to undergo the procedure. After obtaining                            informed consent, the endoscope was passed under                            direct vision. Throughout the procedure, the                            patient's blood pressure, pulse,  and oxygen                            saturations were monitored continuously. The                            [id number]-[id number] ([id number] Olympus adult endoscope was                            introduced through the mouth, and advanced to the                            second part of duodenum. The EGD was accomplished                            without difficulty. The patient tolerated the                            procedure well. Scope In: Scope Out: Findings:      LA Grade A (one or more mucosal breaks less than 5 mm, not extending       between tops of 2 mucosal folds) esophagitis with bleeding was found.      The examined duodenum was normal.      The cardia and gastric fundus were normal on retroflexion.      The examined duodenum was normal. Impression:               - LA Grade A reflux esophagitis.                           - Normal examined stomach and duodenum.                           - No specimens collected. Moderate Sedation:      MAC used. Recommendation:           - High fiber, low fat diet with augmented water                            consumption daily.                           - Continue present medications; avoid all NSAIDS.                           -  Start Prilosec (Omeprazole) 40 mg PO daily.                           - Return to my office in 4 weeks. Procedure Code(s):        --- Professional ---                           [phone], Esophagogastroduodenoscopy, flexible,                            transoral; diagnostic, including collection of                            specimen(s) by brushing or washing, when performed                            (separate procedure) Diagnosis Code(s):        --- Professional ---                           R13.10, Dysphagia, unspecified                           K21.0, Gastro-esophageal reflux disease with                            esophagitis CPT copyright 2017 American Medical Association. All rights reserved. The codes documented in  this report are preliminary and upon coder review may  be revised to meet current compliance requirements. Juanita Craver, MD Juanita Craver, MD 08/27/2018 8:09:38 AM This report has been signed electronically. Number of Addenda: 0

## 2018-08-27 NOTE — Anesthesia Procedure Notes (Signed)
Procedure Name: MAC Date/Time: 08/27/2018 7:32 AM Performed by: Dione Booze, CRNA Pre-anesthesia Checklist: Patient identified, Emergency Drugs available, Suction available and Patient being monitored Patient Re-evaluated:Patient Re-evaluated prior to induction Oxygen Delivery Method: Nasal cannula Placement Confirmation: positive ETCO2

## 2018-08-27 NOTE — Discharge Instructions (Signed)

## 2018-08-27 NOTE — Anesthesia Preprocedure Evaluation (Addendum)
Anesthesia Evaluation  Patient identified by MRN, date of birth, ID band Patient awake    Reviewed: Allergy & Precautions, NPO status , Patient's Chart, lab work & pertinent test results  History of Anesthesia Complications Negative for: history of anesthetic complications  Airway Mallampati: II  TM Distance: >3 FB Neck ROM: Full    Dental  (+) Teeth Intact   Pulmonary sleep apnea ,    breath sounds clear to auscultation (-) decreased breath sounds      Cardiovascular hypertension,  Rhythm:Regular Rate:Normal     Neuro/Psych PSYCHIATRIC DISORDERS Anxiety Depression    GI/Hepatic Neg liver ROS, GERD  ,  Endo/Other  Hypothyroidism   Renal/GU negative Renal ROS  negative genitourinary   Musculoskeletal negative musculoskeletal ROS (+)   Abdominal (+) + obese,   Peds  Hematology negative hematology ROS (+)   Anesthesia Other Findings   Reproductive/Obstetrics                            Anesthesia Physical Anesthesia Plan  ASA: III  Anesthesia Plan: MAC   Post-op Pain Management:    Induction:   PONV Risk Score and Plan: 2 and Propofol infusion and Treatment may vary due to age or medical condition  Airway Management Planned: Natural Airway and Nasal Cannula  Additional Equipment:   Intra-op Plan:   Post-operative Plan:   Informed Consent: I have reviewed the patients History and Physical, chart, labs and discussed the procedure including the risks, benefits and alternatives for the proposed anesthesia with the patient or authorized representative who has indicated his/her understanding and acceptance.     Plan Discussed with:   Anesthesia Plan Comments:        Anesthesia Quick Evaluation

## 2018-08-27 NOTE — Anesthesia Postprocedure Evaluation (Signed)
Anesthesia Post Note  Patient: Chelsea Thompson  Procedure(s) Performed: ESOPHAGOGASTRODUODENOSCOPY (EGD) WITH PROPOFOL (N/A ) COLONOSCOPY WITH PROPOFOL (N/A )     Patient location during evaluation: PACU Anesthesia Type: MAC Level of consciousness: awake and alert Pain management: pain level controlled Vital Signs Assessment: post-procedure vital signs reviewed and stable Respiratory status: spontaneous breathing, nonlabored ventilation, respiratory function stable and patient connected to nasal cannula oxygen Cardiovascular status: stable and blood pressure returned to baseline Postop Assessment: no apparent nausea or vomiting Anesthetic complications: no    Last Vitals:  Vitals:   08/27/18 0810 08/27/18 0820  BP: 116/70 122/60  Pulse: 82 74  Resp: 20 20  Temp:    SpO2: 100% 100%    Last Pain:  Vitals:   08/27/18 0820  TempSrc:   PainSc: 0-No pain                 Lidia Collum

## 2018-08-27 NOTE — H&P (Signed)
Chelsea Thompson is an 47 y.o. female.   Chief Complaint: Change in bowel habits/LLQ/Difficulty swallowing/Nausea. HPI: 47 year old, morbidly obese black female with multiple medical problems listed below including severe sleep apnea here for an EGD/Colonoscopy. See office notes for details.  Past Medical History:  Diagnosis Date  . Allergic rhinitis   . Chronic lower back pain   . Complication of anesthesia    "it takes away my memory" (06/06/2018)  . GERD (gastroesophageal reflux disease) 04/08/2016  . Glucose intolerance (impaired glucose tolerance)   . Headache    "maybe monthly" (06/06/2018)  . HTN    . Hyperlipidemia   . Hypothyroidism    DR [name]  . Impaired glucose tolerance 10/04/2011  .  MORBID OBESITY   . OSA on CPAP-AHI 40.3 10/08/2014   "not using this right now" (06/06/2018)  . Pituitary adenoma [facility])    Past Surgical History:  Procedure Laterality Date  . ANTERIOR CERVICAL DECOMP/DISCECTOMY FUSION  11/18/2012   Procedure: ANTERIOR CERVICAL DECOMPRESSION/DISCECTOMY FUSION 1 LEVEL;  Surgeon: Hosie Spangle, MD;  Location: [place] [facility];  Service: Neurosurgery;  Laterality: N/A;  Cervical five-six Anterior cervical decompression/diskectomy/fusion   . BACK SURGERY    . DILATION AND CURETTAGE OF UTERUS  07/2010   Hysteroscopy NEG w/Dr Mezer  . REDUCTION MAMMAPLASTY Bilateral 2001   Family History  Problem Relation Age of Onset  . Other Mother 68       MVA   . Hypertension Mother   . Hypertension Sister   . Hypertension Brother   . Cancer Brother        Prostate   Social History:  reports that she has never smoked. She has never used smokeless tobacco. She reports that she does not drink alcohol or use drugs.  Allergies:  Allergies  Allergen Reactions  . Peanut-Containing Drug Products Anaphylaxis  . Shellfish Allergy Anaphylaxis   Medications Prior to Admission  Medication Sig Dispense Refill  . amLODipine (NORVASC) 10 MG tablet Take 1 tablet (10 mg  total) by mouth daily. 30 tablet 0  . atorvastatin (LIPITOR) 80 MG tablet Take 1 tablet (80 mg total) by mouth daily at 6 PM. 30 tablet 0  . Azilsartan-Chlorthalidone (EDARBYCLOR) 40-25 MG TABS Take 1 tablet by mouth daily.    . cabergoline (DOSTINEX) 0.5 MG tablet Take 0.5 mg by mouth 2 (two) times a week.   5  . celecoxib (CELEBREX) 200 MG capsule Take 1 capsule (200 mg total) by mouth 2 (two) times daily as needed. (Patient taking differently: Take 200 mg by mouth 2 (two) times daily as needed for moderate pain. ) 60 capsule 5  . cyclobenzaprine (FLEXERIL) 5 MG tablet TAKE 1 TABLET BY MOUTH THREE TIMES A DAY AS NEEDED FOR MUSCLE SPASMS 60 tablet 1  . EPINEPHrine (EPIPEN 2-PAK) 0.3 mg/0.3 mL IJ SOAJ injection Inject 0.3 mg into the muscle once.    . hydrALAZINE (APRESOLINE) 100 MG tablet Take 1 tablet (100 mg total) by mouth 3 (three) times daily. (Patient taking differently: Take 50 mg by mouth 3 (three) times daily. ) 90 tablet 11  . levothyroxine (SYNTHROID, LEVOTHROID) 25 MCG tablet TAKE 1 TABLET (25 MCG TOTAL) BY MOUTH DAILY BEFORE BREAKFAST. (Patient taking differently: Take 25-50 mcg by mouth See admin instructions. Take 50 mcg by mouth daily on Saturday and Sunday. Take 25 mcg by mouth daily on all other days.) 90 tablet 3  . linaclotide (LINZESS) 145 MCG CAPS capsule Take 145 mcg by mouth daily  before breakfast.    . Multiple Vitamin (MULTIVITAMIN WITH MINERALS) TABS Take 1 tablet by mouth daily.    . nadolol (CORGARD) 80 MG tablet Take 2 tablets (160 mg total) by mouth daily. 60 tablet 0  . Polyethyl Glycol-Propyl Glycol (SYSTANE) 0.4-0.3 % SOLN Place 1 drop into both eyes daily as needed (for dry eyes).     Marland Kitchen spironolactone (ALDACTONE) 25 MG tablet Take 25 mg by mouth 2 (two) times daily.   2  . traMADol (ULTRAM) 50 MG tablet Take 0.5-1 tablets (25-50 mg total) by mouth every 6 (six) hours as needed for moderate pain or severe pain. 120 tablet 2  . zolpidem (AMBIEN) 10 MG tablet TAKE 1  TABLET (10 MG TOTAL) BY MOUTH AT BEDTIME AS NEEDED FOR SLEEP. 30 tablet 0  . ALPRAZolam (XANAX) 0.25 MG tablet Take 1 tablet (0.25 mg total) by mouth 2 (two) times daily as needed for anxiety. (Patient not taking: Reported on 07/23/2018) 60 tablet 1    No results found for this or any previous visit (from the past 48 hour(s)). No results found.  Review of Systems  Constitutional: Negative.   HENT: Negative.   Eyes: Negative.   Respiratory: Negative.   Gastrointestinal: Positive for abdominal pain, constipation and nausea. Negative for blood in stool, diarrhea, melena and vomiting.  Musculoskeletal: Positive for joint pain.  Skin: Negative.   Neurological: Negative.   Endo/Heme/Allergies: Negative.   Psychiatric/Behavioral: Negative.    Blood pressure 139/86, pulse 86, temperature 97.8 F (36.6 C), temperature source Oral, resp. rate 20, height 5\' 4"  (1.626 m), weight (!) 142 kg, SpO2 100 %. Physical Exam  Constitutional: She is oriented to person, place, and time. She appears well-developed and well-nourished.  Morbidly obese  HENT:  Head: Normocephalic and atraumatic.  Eyes: Pupils are equal, round, and reactive to light. Conjunctivae and EOM are normal.  Neck: Normal range of motion. Neck supple.  Cardiovascular: Normal rate and regular rhythm.  Respiratory: Effort normal and breath sounds normal.  GI: Soft. Bowel sounds are normal.  Musculoskeletal: Normal range of motion.  Neurological: She is alert and oriented to person, place, and time.  Skin: Skin is warm and dry.  Psychiatric: She has a normal mood and affect. Her behavior is normal. Judgment and thought content normal.    Assessment/Plan Change in bowel habits/LLQ pain/Nausea/Dysphagia/CRC screening-proceed with an EGD/Colonoscopy at this time. Patient has a BMI of 53.89 and an AHI of 40.3.  Laurali Goddard, MD 08/27/2018, 7:16 AM

## 2018-08-27 NOTE — Op Note (Addendum)
[facility] Patient Name: Chelsea Thompson Procedure Date: 08/27/2018 MRN: [id number] Attending MD: Juanita Craver , MD Date of Birth: [birth year] CSN: [id number] Age: 47 Admit Type: Outpatient Procedure:                Diagnostic colonoscopy. Indications:              Change in bowel habits; LLQ pain; CRC screening for                            colorectal malignant neoplasm. Providers:                Juanita Craver, MD, Carolynn Comment RN, RN, Charolette Child, Technician, Dione Booze, CRNA. Referring MD:             Servando Salina MD, Charolette Forward MD Medicines:                Monitored Anesthesia Care Complications:            No immediate complications. Estimated Blood Loss:     Estimated blood loss: none. Procedure:                Pre-anesthesia assessment: - Prior to the                            procedure, a history and physical was performed,                            and patient medications and allergies were                            reviewed. The patient's tolerance of previous                            anesthesia was also reviewed. The risks and                            benefits of the procedure and the sedation options                            and risks were discussed with the patient. All                            questions were answered, and informed consent was                            obtained. Prior Anticoagulants: The patient has                            taken Celebrex (celecoxib), last dose was 1 day                            prior to procedure. ASA Grade assessment: II - A  patient with mild systemic disease. After reviewing                            the risks and benefits, the patient was deemed in                            satisfactory condition to undergo the procedure.                           After obtaining informed consent, the colonoscope                            was  passed under direct vision. Throughout the                            procedure, the patient's blood pressure, pulse, and                            oxygen saturations were monitored continuously. The                            CF-HQ190L ([id number]) Olympus adult colonoscope was                            introduced through the anus and advanced to the the                            terminal ileum, with identification of the                            appendiceal orifice and IC valve. The colonoscopy                            was performed without difficulty. The patient                            tolerated the procedure well. The quality of the                            bowel preparation was adequate. The terminal ileum,                            the ileocecal valve, the appendiceal orifice and                            the rectum were photographed. The bowel preparation                            used was GoLYTELY. Scope In: 7:48:57 AM Scope Out: 7:59:53 AM Scope Withdrawal Time: 0 hours 7 minutes 15 seconds  Total Procedure Duration: 0 hours 10 minutes 56 seconds  Findings:      The entire examined portion of the colon appeared normal.      The terminal ileum appeared normal.  No additional abnormalities were found on retroflexion. Impression:               - The entire examined colon is normal.                           - The examined portion of the ileum was normal.                           - No specimens collected. Moderate Sedation:      MAC used. Recommendation:           - High fiber, low fat diet with augmented water                            consumption daily.                           - Continue present medications.                           - Repeat colonoscopy in 10 years for surveillance.                           - Return to GI office in 4 weeks.                           - If the patient has any abnormal GI symptoms in                            the interim,  she has been advised to call the                            office ASAP for further recommendations. Procedure Code(s):        --- Professional ---                           [id number], Colonoscopy, flexible; diagnostic, including                            collection of specimen(s) by brushing or washing,                            when performed (separate procedure) Diagnosis Code(s):        --- Professional ---                           R19.4, Change in bowel habit                           R10.32, Left lower quadrant pain                           Z12.11, Encounter for screening for malignant                            neoplasm of colon CPT  copyright 2017 American Medical Association. All rights reserved. The codes documented in this report are preliminary and upon coder review may  be revised to meet current compliance requirements. Juanita Craver, MD Juanita Craver, MD 08/27/2018 8:17:10 AM This report has been signed electronically. Number of Addenda: 0

## 2018-10-10 ENCOUNTER — Other Ambulatory Visit: Payer: Self-pay | Admitting: Internal Medicine

## 2018-10-11 ENCOUNTER — Ambulatory Visit: Payer: BLUE CROSS/BLUE SHIELD | Admitting: Family

## 2018-10-11 ENCOUNTER — Encounter: Payer: Self-pay | Admitting: Family

## 2018-10-11 VITALS — BP 132/82 | HR 78 | Temp 97.8°F | Ht 64.0 in | Wt 312.0 lb

## 2018-10-11 DIAGNOSIS — F419 Anxiety disorder, unspecified: Secondary | ICD-10-CM

## 2018-10-11 DIAGNOSIS — G4700 Insomnia, unspecified: Secondary | ICD-10-CM

## 2018-10-11 DIAGNOSIS — I10 Essential (primary) hypertension: Secondary | ICD-10-CM | POA: Diagnosis not present

## 2018-10-11 DIAGNOSIS — H6123 Impacted cerumen, bilateral: Secondary | ICD-10-CM

## 2018-10-11 MED ORDER — SPIRONOLACTONE 25 MG PO TABS: 25.0000 mg | ORAL_TABLET | Freq: Two times a day (BID) | ORAL | 0 refills | Status: AC

## 2018-10-11 MED ORDER — ZOLPIDEM TARTRATE 10 MG PO TABS: 10.0000 mg | ORAL_TABLET | Freq: Every evening | ORAL | 0 refills | Status: DC | PRN

## 2018-10-11 MED ORDER — ALPRAZOLAM 0.25 MG PO TABS: 0.2500 mg | ORAL_TABLET | Freq: Two times a day (BID) | ORAL | 0 refills | Status: DC | PRN

## 2018-10-11 NOTE — Progress Notes (Signed)
Chelsea Thompson is a 47 y.o. female with the following history as recorded in [employer]:  Patient Active Problem List   Diagnosis Date Noted  . LLQ pain 05/21/2018  . Chronic neck and back pain 05/21/2018  . Headache 04/09/2018  . Cervical radiculopathy 01/18/2018  . Acute upper respiratory infection 01/04/2018  . Left shoulder pain 01/04/2018  . Left low back pain 01/04/2018  . Insomnia 12/26/2017  . Hypokalemia 12/26/2017  . Vitamin D deficiency 12/26/2017  . Bilateral hearing loss 12/26/2017  . Cough 04/08/2016  . GERD (gastroesophageal reflux disease) 04/08/2016  . Food allergy 12/29/2014  . OSA on CPAP 12/09/2014  . Severe obesity (BMI >= 40) ([place]) 12/09/2014  . Nocturia more than twice per night 10/08/2014  . Obesity hypoventilation syndrome ([place]) 10/08/2014  . Angioedema 08/29/2014  . Neck pain 07/23/2013  . Goiter 07/23/2013  . Right shoulder pain 10/06/2012  . Fatigue 10/02/2012  . Wrist pain, right 02/27/2012  . Nausea 02/27/2012  . Anxiety 02/27/2012  . Impaired glucose tolerance 10/04/2011  . Encounter for well adult exam with abnormal findings 10/04/2011  . Hyperlipidemia 02/08/2011  . Depression 02/27/2010  . ALLERGIC RHINITIS 02/27/2010  . Hypothyroidism 12/28/2008  . Essential hypertension 12/28/2008  . DYSPNEA 09/23/2008  . PITUITARY ADENOMA 09/22/2008  . OBESITY 09/22/2008    Current Outpatient Medications  Medication Sig Dispense Refill  . ALPRAZolam (XANAX) 0.25 MG tablet Take 1 tablet (0.25 mg total) by mouth 2 (two) times daily as needed for anxiety. 60 tablet 0  . amLODipine (NORVASC) 10 MG tablet Take 1 tablet (10 mg total) by mouth daily. 30 tablet 0  . atorvastatin (LIPITOR) 20 MG tablet Take 20 mg by mouth daily.  3  . atorvastatin (LIPITOR) 80 MG tablet Take 1 tablet (80 mg total) by mouth daily at 6 PM. 30 tablet 0  . Azilsartan-Chlorthalidone (EDARBYCLOR) 40-25 MG TABS Take 1 tablet by mouth daily.    . cabergoline (DOSTINEX) 0.5 MG  tablet Take 0.5 mg by mouth 2 (two) times a week.   5  . celecoxib (CELEBREX) 200 MG capsule Take 1 capsule (200 mg total) by mouth 2 (two) times daily as needed. (Patient taking differently: Take 200 mg by mouth 2 (two) times daily as needed for moderate pain. ) 60 capsule 5  . cyclobenzaprine (FLEXERIL) 5 MG tablet TAKE 1 TABLET BY MOUTH THREE TIMES A DAY AS NEEDED FOR MUSCLE SPASMS 60 tablet 1  . EPINEPHrine (EPIPEN 2-PAK) 0.3 mg/0.3 mL IJ SOAJ injection Inject 0.3 mg into the muscle once.    . hydrALAZINE (APRESOLINE) 100 MG tablet Take 1 tablet (100 mg total) by mouth 3 (three) times daily. (Patient taking differently: Take 50 mg by mouth 3 (three) times daily. ) 90 tablet 11  . hydrochlorothiazide (MICROZIDE) 12.5 MG capsule Take by mouth daily.  3  . levothyroxine (SYNTHROID, LEVOTHROID) 25 MCG tablet TAKE 1 TABLET (25 MCG TOTAL) BY MOUTH DAILY BEFORE BREAKFAST. (Patient taking differently: Take 25-50 mcg by mouth See admin instructions. Take 50 mcg by mouth daily on Saturday and Sunday. Take 25 mcg by mouth daily on all other days.) 90 tablet 3  . linaclotide (LINZESS) 145 MCG CAPS capsule Take 145 mcg by mouth daily before breakfast.    . Multiple Vitamin (MULTIVITAMIN WITH MINERALS) TABS Take 1 tablet by mouth daily.    . nadolol (CORGARD) 80 MG tablet Take 2 tablets (160 mg total) by mouth daily. 60 tablet 0  . omeprazole (PRILOSEC) 40 MG capsule  Take by mouth daily.  3  . Polyethyl Glycol-Propyl Glycol (SYSTANE) 0.4-0.3 % SOLN Place 1 drop into both eyes daily as needed (for dry eyes).     Marland Kitchen spironolactone (ALDACTONE) 25 MG tablet Take 1 tablet (25 mg total) by mouth 2 (two) times daily. 60 tablet 0  . traMADol (ULTRAM) 50 MG tablet Take 0.5-1 tablets (25-50 mg total) by mouth every 6 (six) hours as needed for moderate pain or severe pain. 120 tablet 2  . zolpidem (AMBIEN) 10 MG tablet Take 1 tablet (10 mg total) by mouth at bedtime as needed for sleep. 30 tablet 0   No current  facility-administered medications for this visit.     Allergies: Peanut-containing drug products and Shellfish allergy  Past Medical History:  Diagnosis Date  . Allergic rhinitis   . Chronic lower back pain   . Complication of anesthesia    "it takes away my memory" (06/06/2018)  . GERD (gastroesophageal reflux disease) 04/08/2016  . Glucose intolerance (impaired glucose tolerance)   . Headache    "maybe monthly" (06/06/2018)  . HTN (hypertension)   . Hyperlipidemia   . Hypothyroidism    DR [name]  . Impaired glucose tolerance 10/04/2011  . Obesity   . OSA on CPAP 10/08/2014   "not using this right now" (06/06/2018)  . Pituitary adenoma ([place])   . Shortness of breath     Past Surgical History:  Procedure Laterality Date  . ANTERIOR CERVICAL DECOMP/DISCECTOMY FUSION  11/18/2012   Procedure: ANTERIOR CERVICAL DECOMPRESSION/DISCECTOMY FUSION 1 LEVEL;  Surgeon: Hosie Spangle, MD;  Location: [place] [facility];  Service: Neurosurgery;  Laterality: N/A;  Cervical five-six Anterior cervical decompression/diskectomy/fusion   . BACK SURGERY    . COLONOSCOPY WITH PROPOFOL N/A 08/27/2018   Procedure: COLONOSCOPY WITH PROPOFOL;  Surgeon: Juanita Craver, MD;  Location: [facility];  Service: Endoscopy;  Laterality: N/A;  . DILATION AND CURETTAGE OF UTERUS  07/2010   Hysteroscopy NEG w/Dr Mezer  . ESOPHAGOGASTRODUODENOSCOPY (EGD) WITH PROPOFOL N/A 08/27/2018   Procedure: ESOPHAGOGASTRODUODENOSCOPY (EGD) WITH PROPOFOL;  Surgeon: Juanita Craver, MD;  Location: [facility];  Service: Endoscopy;  Laterality: N/A;  . REDUCTION MAMMAPLASTY Bilateral 2001    Family History  Problem Relation Age of Onset  . Other Mother 16       MVA   . Hypertension Mother   . Hypertension Sister   . Hypertension Brother   . Cancer Brother        Prostate    Social History   Tobacco Use  . Smoking status: Never Smoker  . Smokeless tobacco: Never Used  Substance Use Topics  . Alcohol use: Never    Alcohol/week:  0.0 standard drinks    Frequency: Never    Subjective:  Requesting to have ears flushed today; feels both ears "are clogged." Has had to have ear lavage done in the past; using H2O2 at home already with no relief;  Also needs refills on Spironolactone, Xanax and Ambien; blood pressure is being managed by Dr. Terrence Dupont; PCP is out of the office this week.     Objective:  Vitals:   10/11/18 1054  BP: 132/82  Pulse: 78  Temp: 97.8 F (36.6 C)  TempSrc: Oral  SpO2: 99%  Weight: (!) 312 lb (141.5 kg)  Height: 5\' 4"  (1.626 m)    General: Well developed, well nourished, in no acute distress  Skin : Warm and dry.  Head: Normocephalic and atraumatic  Eyes: Sclera and conjunctiva clear; pupils round and  reactive to light; extraocular movements intact  Ears: External normal; after lavage, canals clear; tympanic membranes normal  Oropharynx: Pink, supple. No suspicious lesions  Neck: Supple without thyromegaly, adenopathy  Lungs: Respirations unlabored; clear to auscultation bilaterally without wheeze, rales, rhonchi  CVS exam: normal rate and regular rhythmnormal rate and regular rhythm.  Neurologic: Alert and oriented; speech intact; face symmetrical; moves all extremities well; CNII-XII intact without focal deficit   Assessment:  1. Bilateral impacted cerumen   2. Essential hypertension   3. Anxiety   4. Insomnia, unspecified type     Plan:  1. Ear lavage done with no complications; 2. Reviewed medication list- per patient, she is taking Edarbychlor, HCTZ and Spironolactone; I have asked her to reach out to her cardiologist to make sure this is correct. One month refill of Spironolactone given; 3. Refill on Xanax x 1 month; 4. Refill on Ambien x 1 month.   Keep planned follow-up with her PCP;   No follow-ups on file.  No orders of the defined types were placed in this encounter.   Requested Prescriptions   Signed Prescriptions Disp Refills  . spironolactone (ALDACTONE) 25 MG  tablet 60 tablet 0    Sig: Take 1 tablet (25 mg total) by mouth 2 (two) times daily.  Marland Kitchen ALPRAZolam (XANAX) 0.25 MG tablet 60 tablet 0    Sig: Take 1 tablet (0.25 mg total) by mouth 2 (two) times daily as needed for anxiety.  Marland Kitchen zolpidem (AMBIEN) 10 MG tablet 30 tablet 0    Sig: Take 1 tablet (10 mg total) by mouth at bedtime as needed for sleep.

## 2018-10-11 NOTE — Telephone Encounter (Signed)
Can you ask her to check with her cardiologist to make sure we have the medication list right regarding the diuretics she is taking:  1) HCTZ, 2) Edarbychlor and 3) Spironolactone ?  We can't see Harwani's notes here.

## 2018-10-14 NOTE — Telephone Encounter (Signed)
Called and left message for patient and also created CRM incase she calls back.

## 2018-12-09 ENCOUNTER — Other Ambulatory Visit: Payer: Self-pay | Admitting: Internal Medicine

## 2018-12-17 ENCOUNTER — Other Ambulatory Visit (HOSPITAL_COMMUNITY): Payer: Self-pay | Admitting: Gastroenterology

## 2018-12-17 ENCOUNTER — Ambulatory Visit (HOSPITAL_BASED_OUTPATIENT_CLINIC_OR_DEPARTMENT_OTHER)
Admission: RE | Admit: 2018-12-17 | Discharge: 2018-12-17 | Disposition: A | Discharge status: Home / Self Care | Payer: BLUE CROSS/BLUE SHIELD | Source: Ambulatory Visit | Attending: Gastroenterology | Admitting: Gastroenterology

## 2018-12-17 DIAGNOSIS — R1011 Right upper quadrant pain: Secondary | ICD-10-CM | POA: Diagnosis present

## 2018-12-18 ENCOUNTER — Other Ambulatory Visit: Payer: Self-pay | Admitting: Endocrinology

## 2018-12-18 DIAGNOSIS — D352 Benign neoplasm of pituitary gland: Secondary | ICD-10-CM

## 2018-12-20 ENCOUNTER — Ambulatory Visit (HOSPITAL_COMMUNITY)
Admission: RE | Admit: 2018-12-20 | Discharge: 2018-12-20 | Disposition: A | Discharge status: Home / Self Care | Payer: BLUE CROSS/BLUE SHIELD | Source: Ambulatory Visit | Attending: Gastroenterology | Admitting: Gastroenterology

## 2018-12-20 DIAGNOSIS — R1011 Right upper quadrant pain: Secondary | ICD-10-CM | POA: Insufficient documentation

## 2018-12-20 MED ORDER — TECHNETIUM TC 99M MEBROFENIN IV KIT
5.3000 | PACK | Freq: Once | INTRAVENOUS | Status: AC | PRN
  Administered 2018-12-20: 5.3 via INTRAVENOUS

## 2019-01-02 ENCOUNTER — Telehealth: Payer: Self-pay

## 2019-01-02 NOTE — Telephone Encounter (Signed)
Copied from [place] [id number]. Topic: General - Other >> Jan 02, 2019 11:22 AM Lennox Solders wrote: Reason for CRM: pt is calling to let dr Jenny Reichmann know she had emergency gallbladder surgery on 12/27/2018 at [facility]

## 2019-01-07 ENCOUNTER — Ambulatory Visit
Admission: RE | Admit: 2019-01-07 | Discharge: 2019-01-07 | Disposition: A | Discharge status: Home / Self Care | Payer: BLUE CROSS/BLUE SHIELD | Source: Ambulatory Visit | Attending: Endocrinology | Admitting: Endocrinology

## 2019-01-07 DIAGNOSIS — D352 Benign neoplasm of pituitary gland: Secondary | ICD-10-CM

## 2019-01-07 MED ORDER — GADOBENATE DIMEGLUMINE 529 MG/ML IV SOLN
10.0000 mL | Freq: Once | INTRAVENOUS | Status: AC | PRN
  Administered 2019-01-07: 10 mL via INTRAVENOUS

## 2019-01-10 ENCOUNTER — Encounter: Payer: BLUE CROSS/BLUE SHIELD | Admitting: Internal Medicine

## 2019-03-01 ENCOUNTER — Other Ambulatory Visit: Payer: Self-pay | Admitting: Internal Medicine

## 2019-03-01 ENCOUNTER — Other Ambulatory Visit: Payer: Self-pay | Admitting: Family

## 2019-03-03 NOTE — Telephone Encounter (Signed)
Done erx 

## 2019-03-05 IMAGING — CT CT ANGIO CHEST-ABD-PELV FOR DISSECTION W/ AND WO/W CM
2 of 9 series · 12 of 46 positions shown, 14 images · IV contrast (OMNI)
Comparison: None.

CLINICAL DATA: Chest pain. Shortness of breath. Nausea and
vomiting. Evaluate for thoracic aortic dissection.

EXAM:
CT ANGIOGRAPHY CHEST, ABDOMEN AND PELVIS
TECHNIQUE: Multidetector CT imaging through the chest, abdomen and pelvis was
performed using the standard protocol during bolus administration of
intravenous contrast. Multiplanar reconstructed images and MIPs were
obtained and reviewed to evaluate the vascular anatomy.
CONTRAST:  100 cc XSYAOP-VVJ IOPAMIDOL (XSYAOP-VVJ) INJECTION 76%

[Series 6: dissection 3.0 i30f 3 · axial · 0.96mm/px · z∈[-807,-258]mm · 9 of 211 slices shown, 11 images]
[im 14/211  soft-tissue]
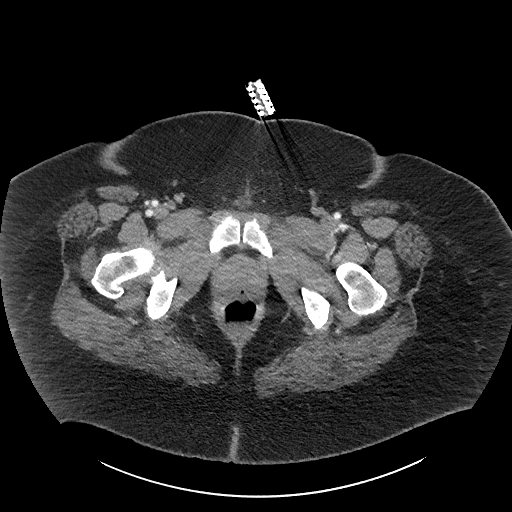
[im 14/211  bone]
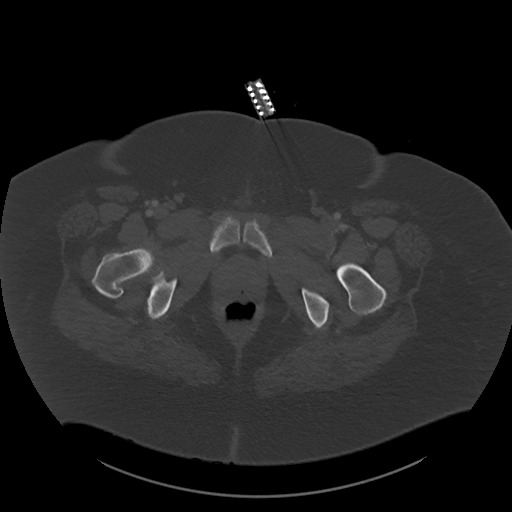
[im 40/211  soft-tissue]
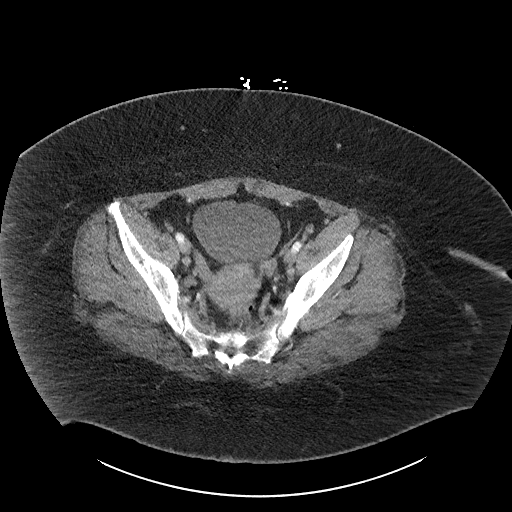
[im 66/211  soft-tissue]
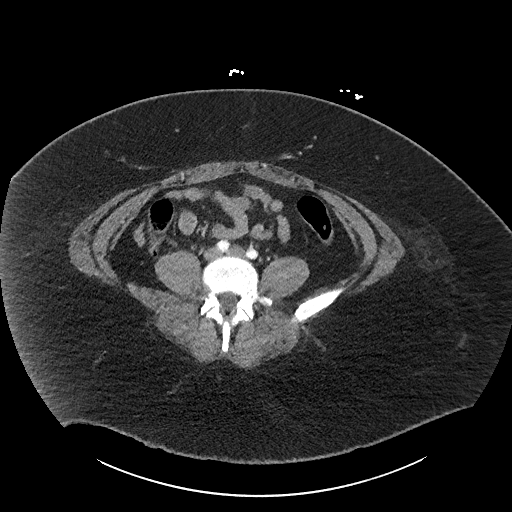
[im 79/211  soft-tissue]
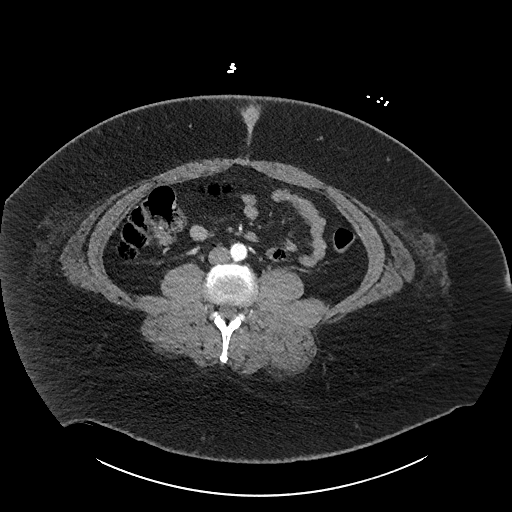
[im 106/211  soft-tissue]
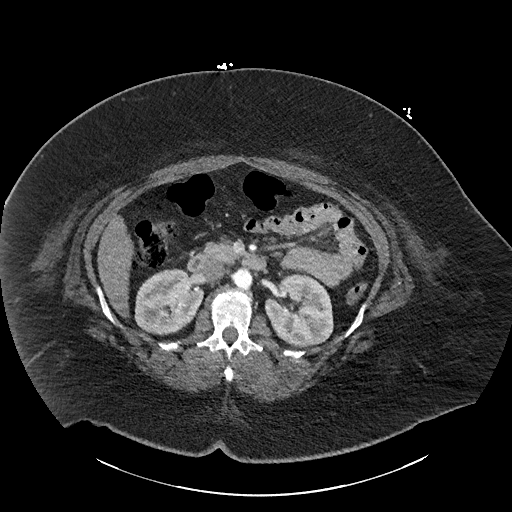
[im 132/211  soft-tissue]
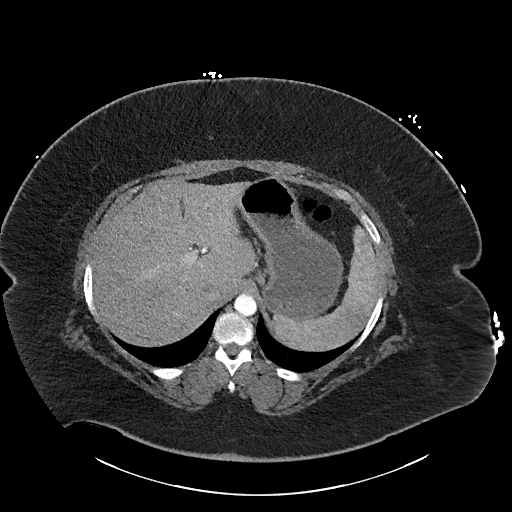
[im 145/211  soft-tissue]
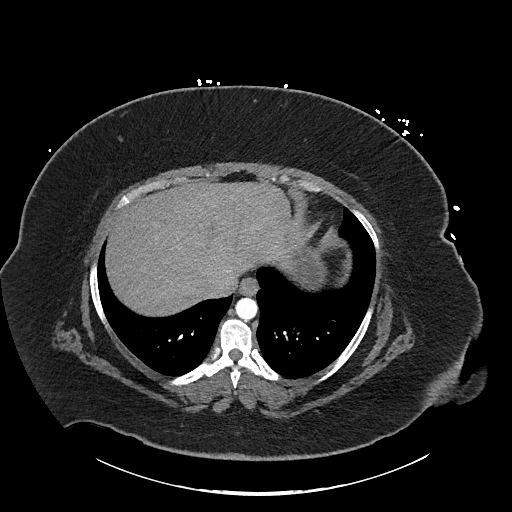
[im 171/211  soft-tissue]
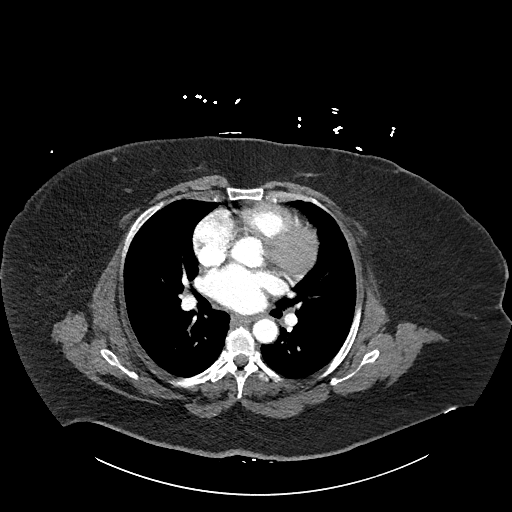
[im 197/211  soft-tissue]
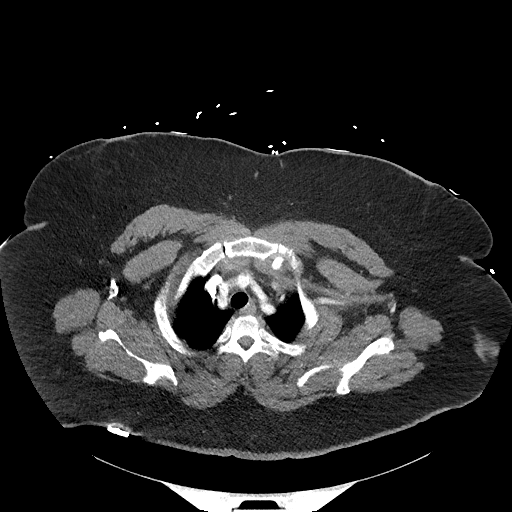
[im 197/211  bone]
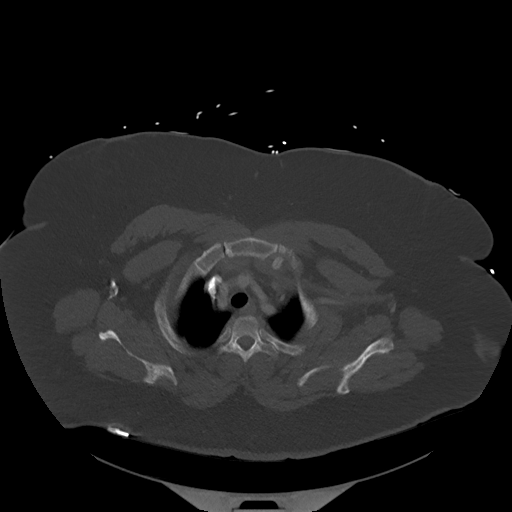

[Series 9: coronals · coronal · 0.80mm/px · 3 of 160 slices shown]
[im 40/160  soft-tissue]
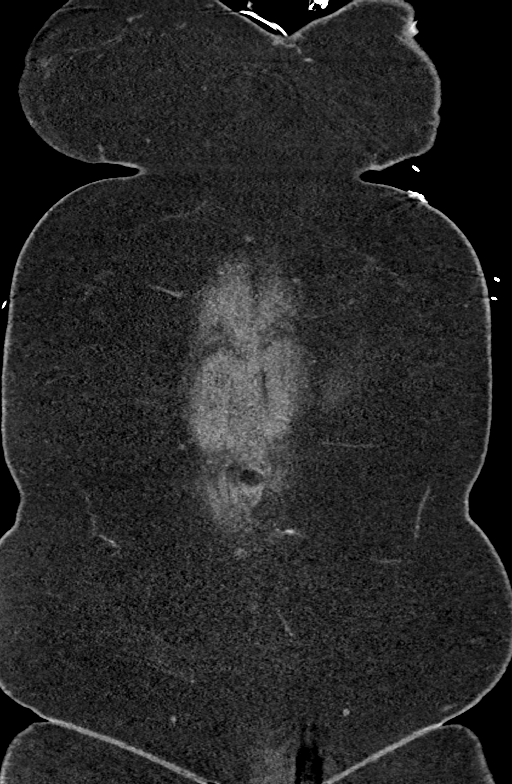
[im 80/160  soft-tissue]
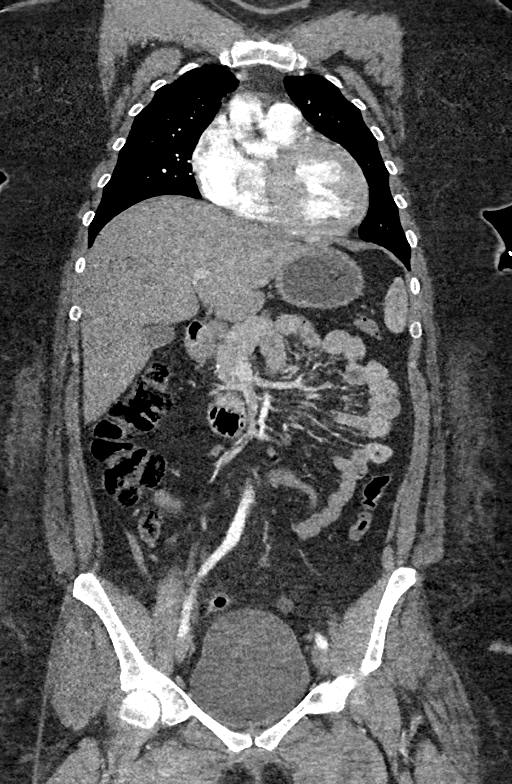
[im 120/160  soft-tissue]
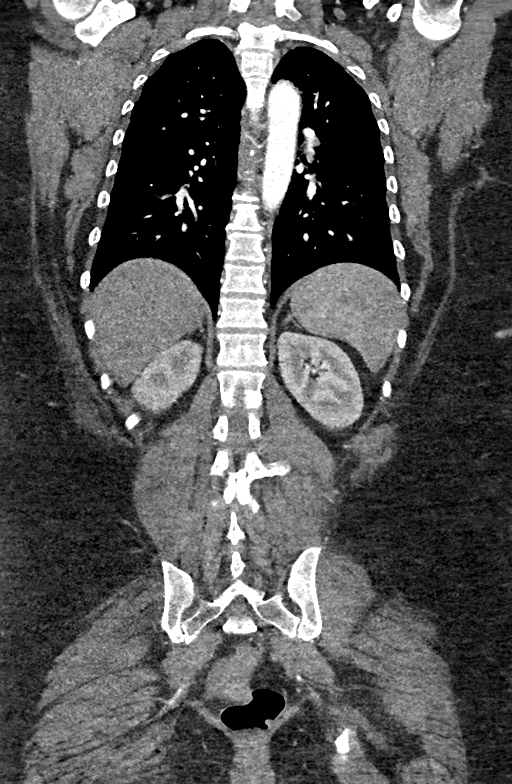

[12 of 46 positions shown; findings below may reference images not displayed]

FINDINGS: CTA CHEST FINDINGS

Vascular Findings:

Normal caliber of the thoracic aorta. No evidence of thoracic aortic
dissection or periaortic stranding on this nongated examination.
Reviewed precontrast images is negative for the presence of an
intramural hematoma.

Borderline cardiomegaly. Small amount of fluid is seen within the
pericardial recess. No pericardial effusion.

Bovine configuration of the aortic arch. The branch vessels of the
aortic arch appear widely patent throughout their imaged course.

Although this examination was not tailored for the evaluation the
pulmonary arteries, there are no discrete filling defects within the
central pulmonary arterial tree to suggest central pulmonary
embolism. Normal caliber of the main pulmonary artery.

-------------------------------------------------------------

Thoracic aortic measurements:

Sinotubular junction

25 mm as measured in greatest oblique coronal dimension.

Proximal ascending aorta

33 mm as measured in greatest oblique axial dimension at the level
of the main pulmonary artery.

Aortic arch aorta

23 mm as measured in greatest oblique sagittal dimension.

Proximal descending thoracic aorta

26 mm as measured in greatest oblique axial dimension at the level
of the main pulmonary artery.

Distal descending thoracic aorta

21 mm as measured in greatest oblique axial dimension at the level
of the diaphragmatic hiatus.

Review of the MIP images confirms the above findings.

-------------------------------------------------------------

Non-Vascular Findings:

Mediastinum/Lymph Nodes: No bulky mediastinal, hilar axillary
lymphadenopathy.

Lungs/Pleura: No focal airspace opacities. No pleural effusion or
pneumothorax. Central pulmonary airways appear widely patent.

No discrete pulmonary nodules.

Musculoskeletal: No acute or aggressive osseous abnormalities. Post
lower cervical ACDF, incompletely evaluated. Regional soft tissues
appear normal. Normal appearance of the imaged portions of the
thyroid gland.

_________________________________________________________

CTA ABDOMEN AND PELVIS FINDINGS

VASCULAR

Aorta: Normal caliber the abdominal aorta. There is no significant
atherosclerotic plaque within the abdominal aorta. No abdominal
aortic dissection or periaortic stranding.

Celiac: Widely patent.  Conventional branching pattern.

SMA: Widely patent.  Conventional branching pattern.

Renals: Solitary bilaterally; the bilateral arteries are widely
patent without evidence of a hemodynamically significant narrowing.
No vessel irregularity to suggest FMD.

IMA: Widely patent without hemodynamically significant narrowing.

Inflow: There is a very minimal amount of eccentric calcified plaque
involving the bilateral common iliac arteries (image 141, series 6),
not resulting in hemodynamically significant stenosis. The bilateral
common, external and internal iliac arteries are of normal caliber
and widely patent without hemodynamically significant stenosis.

Veins: The IVC and pelvic venous system appears widely patent on
this arterial phase examination.

Review of the MIP images confirms the above findings.

NON-VASCULAR

Evaluation the abdominal organs is limited to the arterial phase of
enhancement.

Hepatobiliary: Normal hepatic contour. No discrete hyperenhancing
hepatic lesions. Normal appearance of the gallbladder given degree
distention. No radiopaque gallstones. No intra extrahepatic bili
duct dilatation. No ascites.

Pancreas: Normal appearance of the pancreas

Spleen: Normal appearance of the spleen

Adrenals/Urinary Tract: There is symmetric enhancement and excretion
of the bilateral kidneys. No definite renal stones on this
postcontrast examination. No discrete renal lesions. No urinary
obstruction or perinephric stranding.

Normal appearance of the bilateral adrenal glands.

Normal appearance of the urinary bladder given degree distention.

Stomach/Bowel: The bowel is normal in course and caliber without
wall thickening or evidence of enteric obstruction. Normal
appearance of the terminal ileum and appendix. No pneumoperitoneum,
pneumatosis or portal venous gas.

Lymphatic: No bulky retroperitoneal, mesenteric, pelvic or inguinal
lymphadenopathy.

Reproductive: The uterus is noted to be retroflexed. No discrete
adnexal lesion. No free fluid in the pelvic cul-de-sac.

Other: Minimal amount of body wall edema about the bilateral flanks.

Musculoskeletal: No acute or aggressive osseous abnormalities.
Mild-to-moderate multilevel lumbar spine DDD, worse at L1-L2 with
disc space height loss, endplate irregularity and sclerosis. A bone
island is noted within the right ilium.

Review of the MIP images confirms the above findings.
IMPRESSION: Chest CTA impression:

1. No acute cardiopulmonary disease. Specifically, no evidence of
thoracic aortic aneurysm, dissection or periaortic stranding on this
nongated examination. No evidence of central pulmonary embolism.
2. Borderline cardiomegaly.

Abdomen and pelvic CTA impression:

1. No evidence of abdominal aortic aneurysm.
2. Very minimal amount of calcified atherosclerotic plaque involving
the bilateral common iliac arteries, resulting hemodynamically
significant stenosis.
3. No acute findings within the abdomen or pelvis. Specifically, no
evidence of enteric or urinary obstruction. Normal appearance of the
appendix.

## 2019-03-15 ENCOUNTER — Other Ambulatory Visit: Payer: Self-pay | Admitting: Internal Medicine

## 2019-03-24 ENCOUNTER — Telehealth: Payer: Self-pay

## 2019-03-24 MED ORDER — ALPRAZOLAM 0.25 MG PO TABS: 0.2500 mg | ORAL_TABLET | Freq: Two times a day (BID) | ORAL | 1 refills | Status: DC | PRN

## 2019-03-24 NOTE — Telephone Encounter (Signed)
Done erx 

## 2019-03-24 NOTE — Addendum Note (Signed)
Addended by: Biagio Borg on: 03/24/2019 01:16 PM   Modules accepted: Orders

## 2019-03-24 NOTE — Telephone Encounter (Signed)
[facility] sent refill request for Alprazolam #60  0.25 mg tablet refill.  Please have Dr. Jenny Reichmann advise.  Thanks, [employer]

## 2019-03-26 ENCOUNTER — Other Ambulatory Visit: Payer: Self-pay | Admitting: Internal Medicine

## 2019-03-27 NOTE — Telephone Encounter (Signed)
[place] Controlled Database Checked Last filled: 01/04/18 # 30 LOV w/you: 06/19/18 Next appt w/you: None

## 2019-03-27 NOTE — Telephone Encounter (Signed)
Done erx 

## 2019-05-30 ENCOUNTER — Other Ambulatory Visit: Payer: Self-pay | Admitting: Internal Medicine

## 2019-05-30 NOTE — Telephone Encounter (Signed)
Done erx 

## 2019-07-28 ENCOUNTER — Other Ambulatory Visit: Payer: Self-pay | Admitting: Internal Medicine

## 2019-07-28 NOTE — Telephone Encounter (Signed)
Done erx  Please let pt know, she is due for yearly OV

## 2019-09-02 ENCOUNTER — Other Ambulatory Visit: Payer: Self-pay | Admitting: Internal Medicine

## 2019-09-02 NOTE — Telephone Encounter (Signed)
Done erx  Please let pt know, this is last rx as we are unable due to the refill policy for further refills without a return office visit

## 2020-03-16 ENCOUNTER — Ambulatory Visit: Payer: BLUE CROSS/BLUE SHIELD | Admitting: Dermatology

## 2020-10-11 ENCOUNTER — Other Ambulatory Visit: Payer: Self-pay | Admitting: Internal Medicine

## 2020-12-16 ENCOUNTER — Other Ambulatory Visit: Payer: Self-pay | Admitting: Endocrinology

## 2020-12-16 DIAGNOSIS — E049 Nontoxic goiter, unspecified: Secondary | ICD-10-CM

## 2020-12-31 ENCOUNTER — Other Ambulatory Visit: Payer: Self-pay

## 2021-01-12 ENCOUNTER — Ambulatory Visit
Admission: RE | Admit: 2021-01-12 | Discharge: 2021-01-12 | Disposition: A | Discharge status: Home / Self Care | Payer: 59 | Source: Ambulatory Visit | Attending: Endocrinology | Admitting: Endocrinology

## 2021-01-12 ENCOUNTER — Other Ambulatory Visit: Payer: Self-pay

## 2021-01-12 DIAGNOSIS — E049 Nontoxic goiter, unspecified: Secondary | ICD-10-CM

## 2021-04-24 ENCOUNTER — Other Ambulatory Visit: Payer: Self-pay | Admitting: Internal Medicine

## 2021-11-10 ENCOUNTER — Other Ambulatory Visit: Payer: Self-pay | Admitting: Internal Medicine

## 2021-12-24 ENCOUNTER — Other Ambulatory Visit: Payer: Self-pay | Admitting: Internal Medicine

## 2022-01-27 ENCOUNTER — Other Ambulatory Visit: Payer: Self-pay | Admitting: Internal Medicine

## 2022-01-31 ENCOUNTER — Other Ambulatory Visit: Payer: Self-pay | Admitting: Endocrinology

## 2022-01-31 DIAGNOSIS — E049 Nontoxic goiter, unspecified: Secondary | ICD-10-CM

## 2022-03-05 ENCOUNTER — Other Ambulatory Visit: Payer: Self-pay | Admitting: Internal Medicine

## 2022-03-15 ENCOUNTER — Ambulatory Visit
Admission: RE | Admit: 2022-03-15 | Discharge: 2022-03-15 | Disposition: A | Discharge status: Home / Self Care | Payer: Managed Care, Other (non HMO) | Source: Ambulatory Visit | Attending: Endocrinology | Admitting: Endocrinology

## 2022-03-15 DIAGNOSIS — E049 Nontoxic goiter, unspecified: Secondary | ICD-10-CM

## 2022-04-06 ENCOUNTER — Other Ambulatory Visit: Payer: Self-pay | Admitting: Internal Medicine

## 2022-05-31 ENCOUNTER — Other Ambulatory Visit: Payer: Self-pay | Admitting: Internal Medicine

## 2022-06-15 ENCOUNTER — Other Ambulatory Visit: Payer: Self-pay | Admitting: Dermatology

## 2022-07-27 ENCOUNTER — Other Ambulatory Visit: Payer: Self-pay | Admitting: Family Medicine

## 2022-07-27 DIAGNOSIS — R1033 Periumbilical pain: Secondary | ICD-10-CM

## 2022-07-27 DIAGNOSIS — R509 Fever, unspecified: Secondary | ICD-10-CM

## 2022-07-27 DIAGNOSIS — L98499 Non-pressure chronic ulcer of skin of other sites with unspecified severity: Secondary | ICD-10-CM

## 2022-08-07 ENCOUNTER — Ambulatory Visit
Admission: RE | Admit: 2022-08-07 | Discharge: 2022-08-07 | Disposition: A | Discharge status: Home / Self Care | Payer: BC Managed Care – PPO | Source: Ambulatory Visit | Attending: Family Medicine | Admitting: Family Medicine

## 2022-08-07 DIAGNOSIS — R1033 Periumbilical pain: Secondary | ICD-10-CM

## 2022-08-07 DIAGNOSIS — R509 Fever, unspecified: Secondary | ICD-10-CM

## 2022-08-07 DIAGNOSIS — L98499 Non-pressure chronic ulcer of skin of other sites with unspecified severity: Secondary | ICD-10-CM

## 2022-08-07 MED ORDER — IOPAMIDOL (ISOVUE-300) INJECTION 61%
100.0000 mL | Freq: Once | INTRAVENOUS | Status: AC | PRN
  Administered 2022-08-07: 100 mL via INTRAVENOUS

## 2022-08-09 ENCOUNTER — Other Ambulatory Visit: Payer: Self-pay | Admitting: Internal Medicine

## 2022-08-18 ENCOUNTER — Other Ambulatory Visit: Payer: Self-pay | Admitting: Obstetrics and Gynecology

## 2022-08-18 DIAGNOSIS — D259 Leiomyoma of uterus, unspecified: Secondary | ICD-10-CM

## 2022-08-28 ENCOUNTER — Other Ambulatory Visit: Payer: Self-pay | Admitting: Physician Assistant

## 2022-08-28 ENCOUNTER — Inpatient Hospital Stay: Admission: RE | Admit: 2022-08-28 | Payer: Managed Care, Other (non HMO) | Source: Ambulatory Visit

## 2022-08-28 DIAGNOSIS — H9312 Tinnitus, left ear: Secondary | ICD-10-CM

## 2022-09-07 ENCOUNTER — Other Ambulatory Visit: Payer: Self-pay | Admitting: Orthopedic Surgery

## 2022-09-07 ENCOUNTER — Ambulatory Visit
Admission: RE | Admit: 2022-09-07 | Discharge: 2022-09-07 | Disposition: A | Discharge status: Home / Self Care | Payer: BC Managed Care – PPO | Source: Ambulatory Visit | Attending: Obstetrics and Gynecology | Admitting: Obstetrics and Gynecology

## 2022-09-07 ENCOUNTER — Encounter: Payer: Self-pay | Admitting: *Deleted

## 2022-09-07 DIAGNOSIS — D259 Leiomyoma of uterus, unspecified: Secondary | ICD-10-CM

## 2022-09-07 HISTORY — PX: IR RADIOLOGIST EVAL & MGMT: IMG5224

## 2022-09-07 NOTE — Consult Note (Signed)
Chief Complaint: Patient was seen in consultation today for symptomatic uterine fibroids  at the request of Cousins,Sheronette  Referring Physician(s): Cousins,Sheronette  History of Present Illness: Chelsea Thompson is a 51 y.o. female with uterine fibroids based on prior ultrasound.  Pregnancy history is G2, P0 with history of ectopic pregnancy.  Patient is married and not currently trying to get pregnant again.  Patient has no expectation to get pregnant in the future.  Patient's main complaint is heavy menstrual bleeding which has been going on for multiple years.  Currently her menstrual periods are lasting 10 to 14 days with 10 days of heavy bleeding.  Sometimes the bleeding can last 2 to 4 weeks.  She has currently been bleeding for 2 weeks.  Bleeding is very heavy and checking her pads every hour.  Patient also has significant pain and cramping during the menstrual periods and 1 week prior to menstrual period.  Patient complains of frequent urination and frequent nighttime urination.  At this point, she feels that the heavy menstrual bleeding is lifestyle limiting and she is anxious about the heavy bleeding and unpredictable duration of the menstrual bleeding.  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   Reportedly, the patient had a negative endometrial biopsy (?2021) and a Pap smear from 05/25/2022 was negative for intraepithelial lesions or malignancy.  Past medical history is significant for laparotomy in 2006 for an ectopic with bleeding and a right salpingectomy.  History of cervical spine surgery.  History of cholecystectomy and breast reduction.  Past medical history is also significant for severe obesity and hypertension.  Patient reports memory loss associated with general anesthesia.  Past Medical History:  Diagnosis Date   Allergic rhinitis     Chronic lower back pain    Complication of anesthesia    "it takes away my memory" (06/06/2018)   GERD (gastroesophageal reflux disease) 04/08/2016   Glucose intolerance (impaired glucose tolerance)    Headache    "maybe monthly" (06/06/2018)   HTN (hypertension)    Hyperlipidemia    Hypothyroidism    DR [name]   Impaired glucose tolerance 10/04/2011   Obesity    OSA on CPAP 10/08/2014   "not using this right now" (06/06/2018)   Pituitary adenoma ([place])    Shortness of breath     Past Surgical History:  Procedure Laterality Date   ANTERIOR CERVICAL DECOMP/DISCECTOMY FUSION  11/18/2012   Procedure: ANTERIOR CERVICAL DECOMPRESSION/DISCECTOMY FUSION 1 LEVEL;  Surgeon: Hosie Spangle, MD;  Location: [facility];  Service: Neurosurgery;  Laterality: N/A;  Cervical five-six Anterior cervical decompression/diskectomy/fusion    BACK SURGERY     COLONOSCOPY WITH PROPOFOL N/A 08/27/2018   Procedure: COLONOSCOPY WITH PROPOFOL;  Surgeon: Juanita Craver, MD;  Location: [facility];  Service: Endoscopy;  Laterality: N/A;   DILATION AND CURETTAGE OF UTERUS  07/2010   Hysteroscopy NEG w/Dr Mezer   ESOPHAGOGASTRODUODENOSCOPY (EGD) WITH PROPOFOL N/A 08/27/2018   Procedure: ESOPHAGOGASTRODUODENOSCOPY (EGD) WITH PROPOFOL;  Surgeon: Juanita Craver, MD;  Location: [facility];  Service: Endoscopy;  Laterality: N/A;   REDUCTION MAMMAPLASTY Bilateral 2001    Allergies: Peanut-containing drug products and Shellfish allergy  Medications: Prior to Admission medications   Medication Sig Start Date End Date Taking? Authorizing Provider  ALPRAZolam (XANAX) 0.25 MG tablet TAKE 1 TABLET BY MOUTH TWICE A DAY AS NEEDED FOR  ANXIETY *NEEDS OFFICE VISIT FOR REFILLS 09/02/19   Biagio Borg, MD  amLODipine (NORVASC) 10 MG tablet Take 1 tablet (10 mg total) by mouth daily. 06/11/18   Cherene Altes, MD  atorvastatin (LIPITOR) 20 MG tablet Take 20 mg by mouth daily. 08/01/18   [provider]  atorvastatin  (LIPITOR) 80 MG tablet Take 1 tablet (80 mg total) by mouth daily at 6 PM. 06/10/18   Cherene Altes, MD  Azilsartan-Chlorthalidone (EDARBYCLOR) 40-25 MG TABS Take 1 tablet by mouth daily.    [provider]  cabergoline (DOSTINEX) 0.5 MG tablet Take 0.5 mg by mouth 2 (two) times a week.  07/09/18   [provider]  celecoxib (CELEBREX) 200 MG capsule TAKE ONE CAPSULE BY MOUTH TWICE A DAY AS NEEDED 03/03/19   Biagio Borg, MD  cyclobenzaprine (FLEXERIL) 5 MG tablet TAKE 1 TABLET BY MOUTH THREE TIMES A DAY AS NEEDED FOR MUSCLE SPASM 08/09/22   Biagio Borg, MD  EPINEPHrine (EPIPEN 2-PAK) 0.3 mg/0.3 mL IJ SOAJ injection Inject 0.3 mg into the muscle once.    [provider]  hydrALAZINE (APRESOLINE) 100 MG tablet Take 1 tablet (100 mg total) by mouth 3 (three) times daily. Patient taking differently: Take 50 mg by mouth 3 (three) times daily.  06/19/18   Biagio Borg, MD  hydrochlorothiazide (MICROZIDE) 12.5 MG capsule Take by mouth daily. 08/30/18   [provider]  levothyroxine (SYNTHROID, LEVOTHROID) 25 MCG tablet TAKE 1 TABLET (25 MCG TOTAL) BY MOUTH DAILY BEFORE BREAKFAST. 12/10/18   Biagio Borg, MD  linaclotide [facility]) 145 MCG CAPS capsule Take 145 mcg by mouth daily before breakfast.    [provider]  metroNIDAZOLE (METROCREAM) 0.75 % cream APPLY TOPICALLY TO FACE AT BEDTIME 06/15/22   Lavonna Monarch, MD  Multiple Vitamin (MULTIVITAMIN WITH MINERALS) TABS Take 1 tablet by mouth daily.    [provider]  nadolol (CORGARD) 80 MG tablet Take 2 tablets (160 mg total) by mouth daily. 06/26/18   Marrian Salvage, FNP  omeprazole (PRILOSEC) 40 MG capsule Take by mouth daily. 09/03/18   [provider]  phentermine 37.5 MG capsule TAKE 1 CAPSULE (37.5 MG TOTAL) BY MOUTH EVERY MORNING. 03/27/19   Biagio Borg, MD  Polyethyl Glycol-Propyl Glycol (SYSTANE) 0.4-0.3 % SOLN Place 1 drop into both eyes daily as needed (for dry eyes).      [provider]  spironolactone (ALDACTONE) 25 MG tablet Take 1 tablet (25 mg total) by mouth 2 (two) times daily. 10/11/18   Marrian Salvage, FNP  traMADol (ULTRAM) 50 MG tablet Take 0.5-1 tablets (25-50 mg total) by mouth every 6 (six) hours as needed for moderate pain or severe pain. 06/19/18   Biagio Borg, MD  zolpidem (AMBIEN) 10 MG tablet TAKE 1 TABLET (10 MG TOTAL) BY MOUTH AT BEDTIME AS NEEDED FOR SLEEP. 03/03/19 04/02/19  Biagio Borg, MD  fexofenadine (ALLEGRA) 180 MG tablet Take 180 mg by mouth daily.    02/27/12  [provider]     Family History  Problem Relation Age of Onset   Other Mother 33       MVA    Hypertension Mother    Hypertension Sister    Hypertension Brother    Cancer Brother        Prostate    Social History   Socioeconomic History   Marital status: Married    Spouse name: Torry   Number of children:  0   Years of education: Masters0   Highest education level: Not on file  Occupational History    Employer: [employer]  Tobacco Use   Smoking status: Never   Smokeless tobacco: Never  Vaping Use   Vaping Use: Never used  Substance and Sexual Activity   Alcohol use: Never    Alcohol/week: 0.0 standard drinks of alcohol   Drug use: Never   Sexual activity: Not on file  Other Topics Concern   Not on file  Social History Narrative   Patient is married (Torry) and lives at home with her husband and her mother.   Patient has a Scientist, water quality.   Patient is right-handed.   Patient drinks one cup of soda daily.   Works as Fourth Grade Teacher - [employer] Strain: Not on [employer] Insecurity: Not on file  Transportation Needs: Not on file  Physical Activity: Not on file  Stress: Not on file  Social Connections: Not on file    ECOG Status: 1 - Symptomatic but completely ambulatory   Review of Systems  Constitutional:  Positive for fatigue.   Respiratory: Negative.    Cardiovascular: Negative.   Gastrointestinal:  Positive for abdominal pain.  Genitourinary:  Positive for frequency, menstrual problem, pelvic pain and urgency.    Vital Signs: BP 131/84 (BP Location: Right Arm)   Pulse 82   SpO2 99%     Physical Exam Constitutional:      Appearance: She is obese. She is not ill-appearing.  Cardiovascular:     Rate and Rhythm: Normal rate and regular rhythm.     Pulses: Normal pulses.     Heart sounds: Normal heart sounds.     Comments: Palpable DP pulses bilaterally. Pulmonary:     Effort: Pulmonary effort is normal.     Breath sounds: Normal breath sounds.  Abdominal:     General: There is distension.     Tenderness: There is abdominal tenderness.  Neurological:     Mental Status: She is alert.     Imaging: No results found.    Assessment and Plan:  51 year old with history of uterine fibroids based on previous ultrasound.  The patient has significant menorrhagia and dysmenorrhea.  Currently, the patient's menstrual periods are prolonged and slightly irregular.  The menstrual periods can last up to multiple weeks with heavy flow.  The patient has no intentions of getting pregnant in the future.  Patient is motivated to have something done for her heavy menstrual bleeding and pain.  We discussed treatment options including hormonal therapy, hysterectomy and uterine artery embolization.  We spent the majority of time discussing the uterine artery embolization procedure, recovery and postprocedural expectations.  We discussed vascular access from either the groin or left radial approach.  Patient is reluctant to have a radial approach because she is scheduled to have left wrist surgery in approximately one month.  Explained to the patient that she would be kept in the hospital overnight after the procedure for pain control.  Patient should expect to be out of work for 2 weeks following the procedure and should expect  pain and cramping for 1 to 2 weeks following the procedure.  We discussed the risk of the procedure which include bleeding, infection and vascular injury.  Patient will need a pelvic MRI with and without contrast to further evaluate her fibroid disease.  Reportedly, the patient  had a endometrial biopsy 2 years ago but I do not have record of that.  Due to the patient's irregular menstrual bleeding, I will ask Dr. Garwin Brothers to perform or repeat the biopsy.  Assuming that there is no contraindication for uterine artery embolization based on the biopsy or MRI, the patient appears to be a good candidate for uterine artery embolization she would like to proceed with that treatment option.  We will contact the patient after her tests have been performed and reviewed.  Thank you for this interesting consult.  I greatly enjoyed meeting Chelsea Thompson and look forward to participating in their care.  A copy of this report was sent to the requesting provider on this date.  Electronically Signed: Burman Riis 09/07/2022, 12:19 PM   I spent a total of  40 Minutes   in face to face in clinical consultation, greater than 50% of which was counseling/coordinating care for symptomatic uterine fibroids.

## 2022-09-19 ENCOUNTER — Ambulatory Visit
Admission: RE | Admit: 2022-09-19 | Discharge: 2022-09-19 | Disposition: A | Discharge status: Home / Self Care | Payer: Commercial Managed Care - HMO | Source: Ambulatory Visit | Attending: Physician Assistant | Admitting: Physician Assistant

## 2022-09-19 DIAGNOSIS — H9312 Tinnitus, left ear: Secondary | ICD-10-CM

## 2022-09-19 MED ORDER — GADOBUTROL 1 MMOL/ML IV SOLN
10.0000 mL | Freq: Once | INTRAVENOUS | Status: AC | PRN
  Administered 2022-09-19: 10 mL via INTRAVENOUS

## 2022-10-03 ENCOUNTER — Other Ambulatory Visit: Payer: Self-pay | Admitting: Physician Assistant

## 2022-10-03 DIAGNOSIS — H9313 Tinnitus, bilateral: Secondary | ICD-10-CM

## 2022-10-09 ENCOUNTER — Ambulatory Visit
Admission: RE | Admit: 2022-10-09 | Discharge: 2022-10-09 | Disposition: A | Discharge status: Home / Self Care | Payer: BC Managed Care – PPO | Source: Ambulatory Visit | Attending: Physician Assistant | Admitting: Physician Assistant

## 2022-10-09 DIAGNOSIS — H9313 Tinnitus, bilateral: Secondary | ICD-10-CM

## 2022-10-16 ENCOUNTER — Other Ambulatory Visit: Payer: Self-pay

## 2022-10-16 ENCOUNTER — Encounter (HOSPITAL_COMMUNITY): Payer: Self-pay | Admitting: Orthopedic Surgery

## 2022-10-16 NOTE — Progress Notes (Signed)
PCP - Dr. Janie Morning Cardiologist - Denies  EKG - DOS Stress Test - 06/08/18 ECHO - 06/06/18  CPAP - Does not need anymore  Wegovy: Last dose 10/10/22  ERAS Protcol - Clears until 0530  Anesthesia review: N  Patient verbally denies any shortness of breath, fever, cough and chest pain during phone call   -------------  SDW INSTRUCTIONS given:  Your procedure is scheduled on 10/18/22.  Report to [facility] Main Entrance "A" at 0600 A.M., and check in at the Admitting office.  Call this number if you have problems the morning of surgery:  [phone]   Remember:  Do not eat after midnight the night before your surgery  You may drink clear liquids until 0530 the morning of your surgery.   Clear liquids allowed are: Water, Non-Citrus Juices (without pulp), Carbonated Beverages, Clear Tea, Black Coffee Only, and Gatorade    Take these medicines the morning of surgery with A SIP OF WATER amLODipine (NORVASC)   atorvastatin (LIPITOR) nadolol (CORGARD) levothyroxine (SYNTHROID, LEVOTHROID)  ondansetron (ZOFRAN-ODT)  Acetaminophen-If needed   As of today, STOP taking any Aspirin (unless otherwise instructed by your surgeon) Aleve, Naproxen, Ibuprofen, Motrin, Advil, Goody's, BC's, all herbal medications, fish oil, and all vitamins.                      Do not wear jewelry, make up, or nail polish            Do not wear lotions, powders, perfumes/colognes, or deodorant.            Do not shave 48 hours prior to surgery.  Men may shave face and neck.            Do not bring valuables to the hospital.            [facility] is not responsible for any belongings or valuables.  Do NOT Smoke (Tobacco/Vaping) 24 hours prior to your procedure If you use a CPAP at night, you may bring all equipment for your overnight stay.   Contacts, glasses, dentures or bridgework may not be worn into surgery.      For patients admitted to the hospital, discharge time will be determined by  your treatment team.   Patients discharged the day of surgery will not be allowed to drive home, and someone needs to stay with them for 24 hours.    Special instructions:   [place]- Preparing For Surgery  Before surgery, you can play an important role. Because skin is not sterile, your skin needs to be as free of germs as possible. You can reduce the number of germs on your skin by washing with CHG (chlorahexidine gluconate) Soap before surgery.  CHG is an antiseptic cleaner which kills germs and bonds with the skin to continue killing germs even after washing.    Oral Hygiene is also important to reduce your risk of infection.  Remember - BRUSH YOUR TEETH THE MORNING OF SURGERY WITH YOUR REGULAR TOOTHPASTE  Please do not use if you have an allergy to CHG or antibacterial soaps. If your skin becomes reddened/irritated stop using the CHG.  Do not shave (including legs and underarms) for at least 48 hours prior to first CHG shower. It is OK to shave your face.  Please follow these instructions carefully.   Shower the NIGHT BEFORE SURGERY and the MORNING OF SURGERY with DIAL Soap.   Pat yourself dry with a CLEAN TOWEL.  Wear CLEAN  PAJAMAS to bed the night before surgery  Place CLEAN SHEETS on your bed the night of your first shower and DO NOT SLEEP WITH PETS.   Day of Surgery: Please shower morning of surgery  Wear Clean/Comfortable clothing the morning of surgery Do not apply any deodorants/lotions.   Remember to brush your teeth WITH YOUR REGULAR TOOTHPASTE.   Questions were answered. Patient verbalized understanding of instructions.

## 2022-10-18 ENCOUNTER — Encounter (HOSPITAL_COMMUNITY)
Admission: RE | Disposition: A | Discharge status: Home / Self Care | Payer: Self-pay | Source: Ambulatory Visit | Attending: Orthopedic Surgery

## 2022-10-18 ENCOUNTER — Ambulatory Visit (HOSPITAL_COMMUNITY)
Admission: RE | Admit: 2022-10-18 | Discharge: 2022-10-18 | Disposition: A | Discharge status: Home / Self Care | Payer: No Typology Code available for payment source | Source: Ambulatory Visit | Attending: Orthopedic Surgery | Admitting: Orthopedic Surgery

## 2022-10-18 ENCOUNTER — Encounter (HOSPITAL_COMMUNITY): Payer: Self-pay | Admitting: Orthopedic Surgery

## 2022-10-18 ENCOUNTER — Other Ambulatory Visit: Payer: Self-pay

## 2022-10-18 ENCOUNTER — Ambulatory Visit (HOSPITAL_COMMUNITY): Payer: No Typology Code available for payment source | Admitting: Anesthesiology

## 2022-10-18 ENCOUNTER — Ambulatory Visit (HOSPITAL_BASED_OUTPATIENT_CLINIC_OR_DEPARTMENT_OTHER): Payer: No Typology Code available for payment source | Admitting: Anesthesiology

## 2022-10-18 DIAGNOSIS — M654 Radial styloid tenosynovitis [de Quervain]: Secondary | ICD-10-CM | POA: Diagnosis not present

## 2022-10-18 DIAGNOSIS — Z6841 Body Mass Index (BMI) 40.0 and over, adult: Secondary | ICD-10-CM | POA: Diagnosis not present

## 2022-10-18 DIAGNOSIS — I10 Essential (primary) hypertension: Secondary | ICD-10-CM | POA: Diagnosis not present

## 2022-10-18 DIAGNOSIS — Z79899 Other long term (current) drug therapy: Secondary | ICD-10-CM | POA: Diagnosis not present

## 2022-10-18 DIAGNOSIS — G4733 Obstructive sleep apnea (adult) (pediatric): Secondary | ICD-10-CM | POA: Diagnosis not present

## 2022-10-18 DIAGNOSIS — K219 Gastro-esophageal reflux disease without esophagitis: Secondary | ICD-10-CM | POA: Insufficient documentation

## 2022-10-18 DIAGNOSIS — Z79890 Hormone replacement therapy (postmenopausal): Secondary | ICD-10-CM | POA: Diagnosis not present

## 2022-10-18 DIAGNOSIS — S63592A Other specified sprain of left wrist, initial encounter: Secondary | ICD-10-CM | POA: Diagnosis not present

## 2022-10-18 DIAGNOSIS — F419 Anxiety disorder, unspecified: Secondary | ICD-10-CM | POA: Insufficient documentation

## 2022-10-18 DIAGNOSIS — F32A Depression, unspecified: Secondary | ICD-10-CM | POA: Diagnosis not present

## 2022-10-18 DIAGNOSIS — M65852 Other synovitis and tenosynovitis, left thigh: Secondary | ICD-10-CM | POA: Diagnosis not present

## 2022-10-18 DIAGNOSIS — G4730 Sleep apnea, unspecified: Secondary | ICD-10-CM

## 2022-10-18 DIAGNOSIS — E785 Hyperlipidemia, unspecified: Secondary | ICD-10-CM | POA: Insufficient documentation

## 2022-10-18 DIAGNOSIS — M25532 Pain in left wrist: Secondary | ICD-10-CM | POA: Diagnosis present

## 2022-10-18 DIAGNOSIS — E039 Hypothyroidism, unspecified: Secondary | ICD-10-CM | POA: Diagnosis not present

## 2022-10-18 HISTORY — DX: COVID-19: U07.1

## 2022-10-18 HISTORY — PX: WRIST ARTHROSCOPY WITH CARPOMETACARPEL (CMC) ARTHROPLASTY: SHX5680

## 2022-10-18 HISTORY — PX: DORSAL COMPARTMENT RELEASE: SHX5039

## 2022-10-18 LAB — BASIC METABOLIC PANEL
Anion gap: 12 (ref 5–15)
BUN: 11 mg/dL (ref 6–20)
CO2: 22 mmol/L (ref 22–32)
Calcium: 9.6 mg/dL (ref 8.9–10.3)
Chloride: 101 mmol/L (ref 98–111)
Creatinine, Ser: 0.86 mg/dL (ref 0.44–1.00)
GFR, Estimated: >60 mL/min (ref >60)
Glucose, Bld: 126 mg/dL — ABNORMAL HIGH (ref 70–99)
Potassium: 4 mmol/L (ref 3.5–5.1)
Sodium: 135 mmol/L (ref 135–145)

## 2022-10-18 LAB — CBC
HCT: 40.3 % (ref 36.0–46.0)
Hemoglobin: 13 g/dL (ref 12.0–15.0)
MCH: 28.5 pg (ref 26.0–34.0)
MCHC: 32.3 g/dL (ref 30.0–36.0)
MCV: 88.4 fL (ref 80.0–100.0)
Platelets: 380 10*3/uL (ref 150–400)
RBC: 4.56 MIL/uL (ref 3.87–5.11)
RDW: 14.4 % (ref 11.5–15.5)
WBC: 8.8 10*3/uL (ref 4.0–10.5)
nRBC: 0 % (ref 0.0–0.2)

## 2022-10-18 LAB — POCT PREGNANCY, URINE: Preg Test, Ur: NEGATIVE

## 2022-10-18 SURGERY — WRIST ARTHROSCOPY WITH CARPOMETACARPEL (CMC) ARTHROPLASTY
Anesthesia: Monitor Anesthesia Care | Site: Wrist | Laterality: Left

## 2022-10-18 MED ORDER — DEXAMETHASONE SODIUM PHOSPHATE 10 MG/ML IJ SOLN
INTRAMUSCULAR | Status: DC | PRN
  Administered 2022-10-18: 10 mg

## 2022-10-18 MED ORDER — PROPOFOL 500 MG/50ML IV EMUL
INTRAVENOUS | Status: DC | PRN
  Administered 2022-10-18: 50 ug/kg/min via INTRAVENOUS

## 2022-10-18 MED ORDER — SODIUM CHLORIDE 0.9 % IR SOLN
Status: DC | PRN
  Administered 2022-10-18: 1000 mL

## 2022-10-18 MED ORDER — LACTATED RINGERS IV SOLN: INTRAVENOUS | Status: DC

## 2022-10-18 MED ORDER — ACETAMINOPHEN 500 MG PO TABS
1000.0000 mg | ORAL_TABLET | Freq: Once | ORAL | Status: AC
  Administered 2022-10-18: 1000 mg via ORAL
  Filled 2022-10-18: qty 2

## 2022-10-18 MED ORDER — CHLORHEXIDINE GLUCONATE 0.12 % MT SOLN
15.0000 mL | Freq: Once | OROMUCOSAL | Status: AC
  Administered 2022-10-18: 15 mL via OROMUCOSAL
  Filled 2022-10-18: qty 15

## 2022-10-18 MED ORDER — LIDOCAINE 2% (20 MG/ML) 5 ML SYRINGE
INTRAMUSCULAR | Status: DC | PRN
  Administered 2022-10-18 (×2): 20 mg via INTRAVENOUS

## 2022-10-18 MED ORDER — HYDROMORPHONE HCL 1 MG/ML IJ SOLN
INTRAMUSCULAR | Status: AC
  Filled 2022-10-18: qty 1

## 2022-10-18 MED ORDER — ORAL CARE MOUTH RINSE: 15.0000 mL | Freq: Once | OROMUCOSAL | Status: AC

## 2022-10-18 MED ORDER — FENTANYL CITRATE (PF) 250 MCG/5ML IJ SOLN
INTRAMUSCULAR | Status: AC
  Filled 2022-10-18: qty 5

## 2022-10-18 MED ORDER — AMISULPRIDE (ANTIEMETIC) 5 MG/2ML IV SOLN
INTRAVENOUS | Status: AC
  Filled 2022-10-18: qty 4

## 2022-10-18 MED ORDER — DEXMEDETOMIDINE HCL IN NACL 80 MCG/20ML IV SOLN
INTRAVENOUS | Status: AC
  Filled 2022-10-18: qty 20

## 2022-10-18 MED ORDER — MIDAZOLAM HCL 2 MG/2ML IJ SOLN
INTRAMUSCULAR | Status: DC | PRN
  Administered 2022-10-18: 2 mg via INTRAVENOUS

## 2022-10-18 MED ORDER — ONDANSETRON HCL 4 MG/2ML IJ SOLN
INTRAMUSCULAR | Status: DC | PRN
  Administered 2022-10-18: 4 mg via INTRAVENOUS

## 2022-10-18 MED ORDER — AMISULPRIDE (ANTIEMETIC) 5 MG/2ML IV SOLN
10.0000 mg | Freq: Once | INTRAVENOUS | Status: AC | PRN
  Administered 2022-10-18: 10 mg via INTRAVENOUS

## 2022-10-18 MED ORDER — CEFAZOLIN IN SODIUM CHLORIDE 3-0.9 GM/100ML-% IV SOLN
3.0000 g | INTRAVENOUS | Status: AC
  Administered 2022-10-18: 3 g via INTRAVENOUS
  Filled 2022-10-18: qty 100

## 2022-10-18 MED ORDER — TRAMADOL HCL 50 MG PO TABS: 50.0000 mg | ORAL_TABLET | Freq: Four times a day (QID) | ORAL | 0 refills | Status: AC | PRN

## 2022-10-18 MED ORDER — DEXMEDETOMIDINE HCL IN NACL 80 MCG/20ML IV SOLN
INTRAVENOUS | Status: DC | PRN
  Administered 2022-10-18 (×2): 8 ug via BUCCAL
  Administered 2022-10-18: 4 ug via BUCCAL

## 2022-10-18 MED ORDER — DEXAMETHASONE SODIUM PHOSPHATE 10 MG/ML IJ SOLN
INTRAMUSCULAR | Status: AC
  Filled 2022-10-18: qty 1

## 2022-10-18 MED ORDER — ONDANSETRON HCL 4 MG/2ML IJ SOLN
INTRAMUSCULAR | Status: AC
  Filled 2022-10-18: qty 2

## 2022-10-18 MED ORDER — MIDAZOLAM HCL 2 MG/2ML IJ SOLN
INTRAMUSCULAR | Status: AC
  Filled 2022-10-18: qty 2

## 2022-10-18 MED ORDER — OXYCODONE HCL 5 MG/5ML PO SOLN: 5.0000 mg | Freq: Once | ORAL | Status: DC | PRN

## 2022-10-18 MED ORDER — OXYCODONE HCL 5 MG PO TABS: 5.0000 mg | ORAL_TABLET | Freq: Once | ORAL | Status: DC | PRN

## 2022-10-18 MED ORDER — PROPOFOL 1000 MG/100ML IV EMUL
INTRAVENOUS | Status: AC
  Filled 2022-10-18: qty 100

## 2022-10-18 MED ORDER — HYDROMORPHONE HCL 1 MG/ML IJ SOLN
0.2500 mg | INTRAMUSCULAR | Status: DC | PRN
  Administered 2022-10-18 (×2): 0.5 mg via INTRAVENOUS

## 2022-10-18 MED ORDER — DEXAMETHASONE SODIUM PHOSPHATE 10 MG/ML IJ SOLN
INTRAMUSCULAR | Status: DC | PRN
  Administered 2022-10-18: 5 mg via INTRAVENOUS

## 2022-10-18 MED ORDER — 0.9 % SODIUM CHLORIDE (POUR BTL) OPTIME
TOPICAL | Status: DC | PRN
  Administered 2022-10-18: 1000 mL

## 2022-10-18 MED ORDER — LIDOCAINE 2% (20 MG/ML) 5 ML SYRINGE
INTRAMUSCULAR | Status: AC
  Filled 2022-10-18: qty 5

## 2022-10-18 MED ORDER — PROPOFOL 10 MG/ML IV BOLUS
INTRAVENOUS | Status: AC
  Filled 2022-10-18: qty 20

## 2022-10-18 MED ORDER — ROPIVACAINE HCL 5 MG/ML IJ SOLN
INTRAMUSCULAR | Status: DC | PRN
  Administered 2022-10-18: 30 mL via PERINEURAL

## 2022-10-18 MED ORDER — FENTANYL CITRATE (PF) 100 MCG/2ML IJ SOLN
INTRAMUSCULAR | Status: DC | PRN
  Administered 2022-10-18 (×3): 50 ug via INTRAVENOUS

## 2022-10-18 MED ORDER — ONDANSETRON HCL 4 MG/2ML IJ SOLN: 4.0000 mg | Freq: Once | INTRAMUSCULAR | Status: DC | PRN

## 2022-10-18 SURGICAL SUPPLY — 64 items
BAG COUNTER SPONGE SURGICOUNT (BAG) ×1 IMPLANT
BAG SPNG CNTER NS LX DISP (BAG) ×1
BNDG CMPR 9X4 STRL LF SNTH (GAUZE/BANDAGES/DRESSINGS) ×1
BNDG COHESIVE 1X5 TAN STRL LF (GAUZE/BANDAGES/DRESSINGS) IMPLANT
BNDG ELASTIC 3X5.8 VLCR STR LF (GAUZE/BANDAGES/DRESSINGS) ×1 IMPLANT
BNDG ELASTIC 4X5.8 VLCR STR LF (GAUZE/BANDAGES/DRESSINGS) IMPLANT
BNDG ESMARK 4X9 LF (GAUZE/BANDAGES/DRESSINGS) ×1 IMPLANT
BNDG GAUZE DERMACEA FLUFF 4 (GAUZE/BANDAGES/DRESSINGS) ×1 IMPLANT
BNDG GZE DERMACEA 4 6PLY (GAUZE/BANDAGES/DRESSINGS)
CORD BIPOLAR FORCEPS 12FT (ELECTRODE) ×1 IMPLANT
COVER SURGICAL LIGHT HANDLE (MISCELLANEOUS) ×1 IMPLANT
CUFF TOURN SGL QUICK 18X4 (TOURNIQUET CUFF) IMPLANT
CUFF TOURN SGL QUICK 24 (TOURNIQUET CUFF)
CUFF TRNQT CYL 24X4X16.5-23 (TOURNIQUET CUFF) IMPLANT
DRAPE OEC MINIVIEW 54X84 (DRAPES) ×1 IMPLANT
DRAPE SURG 17X23 STRL (DRAPES) ×1 IMPLANT
DURAPREP 26ML APPLICATOR (WOUND CARE) ×1 IMPLANT
GAUZE SPONGE 2X2 8PLY STRL LF (GAUZE/BANDAGES/DRESSINGS) ×1 IMPLANT
GAUZE SPONGE 4X4 12PLY STRL (GAUZE/BANDAGES/DRESSINGS) ×1 IMPLANT
GAUZE XEROFORM 1X8 LF (GAUZE/BANDAGES/DRESSINGS) ×1 IMPLANT
GLOVE SURG SYN 8.0 (GLOVE) ×1 IMPLANT
GLOVE SURG SYN 8.0 PF PI (GLOVE) ×1 IMPLANT
GOWN STRL REUS W/ TWL LRG LVL3 (GOWN DISPOSABLE) ×1 IMPLANT
GOWN STRL REUS W/ TWL XL LVL3 (GOWN DISPOSABLE) ×2 IMPLANT
GOWN STRL REUS W/TWL LRG LVL3 (GOWN DISPOSABLE) ×1
GOWN STRL REUS W/TWL XL LVL3 (GOWN DISPOSABLE) ×1
IV SET EXTENSION CATH 6 NF (IV SETS) ×1 IMPLANT
KIT BASIN OR (CUSTOM PROCEDURE TRAY) ×1 IMPLANT
KIT TURNOVER KIT B (KITS) ×1 IMPLANT
MANIFOLD NEPTUNE II (INSTRUMENTS) ×1 IMPLANT
NDL 18GX1X1/2 (RX/OR ONLY) (NEEDLE) IMPLANT
NDL HYPO 25GX1X1/2 BEV (NEEDLE) ×1 IMPLANT
NDL HYPO 25X1 1.5 SAFETY (NEEDLE) ×1 IMPLANT
NEEDLE 18GX1X1/2 (RX/OR ONLY) (NEEDLE) ×1 IMPLANT
NEEDLE HYPO 22GX1.5 SAFETY (NEEDLE) IMPLANT
NEEDLE HYPO 25GX1X1/2 BEV (NEEDLE) IMPLANT
NEEDLE HYPO 25X1 1.5 SAFETY (NEEDLE) ×1 IMPLANT
NS IRRIG 1000ML POUR BTL (IV SOLUTION) ×1 IMPLANT
PACK ORTHO EXTREMITY (CUSTOM PROCEDURE TRAY) ×1 IMPLANT
PAD ARMBOARD 7.5X6 YLW CONV (MISCELLANEOUS) ×2 IMPLANT
PAD CAST 3X4 CTTN HI CHSV (CAST SUPPLIES) ×1 IMPLANT
PAD CAST 4YDX4 CTTN HI CHSV (CAST SUPPLIES) IMPLANT
PADDING CAST COTTON 3X4 STRL (CAST SUPPLIES)
PADDING CAST COTTON 4X4 STRL (CAST SUPPLIES) ×1
SHAVER SABRE 2.0 (BURR) ×1 IMPLANT
SPLINT PLASTER CAST XFAST 4X15 (CAST SUPPLIES) IMPLANT
STRIP CLOSURE SKIN 1/2X4 (GAUZE/BANDAGES/DRESSINGS) ×1 IMPLANT
SUCTION FRAZIER HANDLE 10FR (MISCELLANEOUS)
SUCTION TUBE FRAZIER 10FR DISP (MISCELLANEOUS) IMPLANT
SUT ETHILON 4 0 PS 2 18 (SUTURE) IMPLANT
SUT ETHILON 5 0 PS 2 18 (SUTURE) IMPLANT
SUT PROLENE 3 0 PS 2 (SUTURE) IMPLANT
SUT VIC AB 3-0 FS2 27 (SUTURE) IMPLANT
SUT VIC AB 4-0 P-3 18X BRD (SUTURE) IMPLANT
SUT VIC AB 4-0 P3 18 (SUTURE)
SYR CONTROL 10ML LL (SYRINGE) ×1 IMPLANT
TOWEL GREEN STERILE (TOWEL DISPOSABLE) ×1 IMPLANT
TOWEL GREEN STERILE FF (TOWEL DISPOSABLE) ×1 IMPLANT
TUBE CONNECTING 12X1/4 (SUCTIONS) IMPLANT
TUBING ART PRESS 72  MALE/FEM (TUBING) ×1
TUBING ART PRESS 72 MALE/FEM (TUBING) IMPLANT
TUBING ARTHROSCOPY IRRIG 16FT (MISCELLANEOUS) IMPLANT
UNDERPAD 30X36 HEAVY ABSORB (UNDERPADS AND DIAPERS) ×1 IMPLANT
WATER STERILE IRR 1000ML POUR (IV SOLUTION) ×1 IMPLANT

## 2022-10-18 NOTE — Anesthesia Procedure Notes (Signed)
Procedure Name: MAC Date/Time: 10/18/2022 8:27 AM  Performed by: Michele Rockers, CRNAPre-anesthesia Checklist: Patient identified, Emergency Drugs available, Suction available, Timeout performed and Patient being monitored Patient Re-evaluated:Patient Re-evaluated prior to induction Oxygen Delivery Method: Simple face mask

## 2022-10-18 NOTE — Anesthesia Preprocedure Evaluation (Addendum)
Anesthesia Evaluation  Patient identified by MRN, date of birth, ID band Patient awake    Reviewed: Allergy & Precautions, H&P , NPO status , Patient's Chart, lab work & pertinent test results  History of Anesthesia Complications (+) history of anesthetic complications (memory issues after every surgery)  Airway Mallampati: III  TM Distance: >3 FB Neck ROM: Full    Dental  (+) Dental Advisory Given, Teeth Intact   Pulmonary sleep apnea (not using CPAP anymore, stopped using it about 4 years ago)    Pulmonary exam normal breath sounds clear to auscultation       Cardiovascular hypertension (142/86 in preop), Pt. on medications Normal cardiovascular exam Rhythm:Regular Rate:Normal  Echo 2019 - Left ventricle: The cavity size was normal. Wall thickness was    increased in a pattern of severe LVH. Systolic function was    vigorous. The estimated ejection fraction was in the range of 65%    to 70%. Wall motion was normal; there were no regional wall    motion abnormalities. Doppler parameters are consistent with    abnormal left ventricular relaxation (grade 1 diastolic    dysfunction).  - Mitral valve: Moderately calcified annulus.  - Right ventricle: The cavity size was mildly dilated. Wall    thickness was normal.  - Tricuspid valve: There was trivial regurgitation.  - Pulmonary arteries: Systolic pressure was mildly increased. PA    peak pressure: 43 mm Hg (S).     Neuro/Psych  Headaches PSYCHIATRIC DISORDERS Anxiety Depression       GI/Hepatic Neg liver ROS,GERD  Controlled and Medicated,,  Endo/Other  Hypothyroidism  Morbid obesityBMI 55  Renal/GU negative Renal ROS  negative genitourinary   Musculoskeletal negative musculoskeletal ROS (+)    Abdominal  (+) + obese  Peds negative pediatric ROS (+)  Hematology negative hematology ROS (+)   Anesthesia Other Findings Reagen.Frieze- LD:    Reproductive/Obstetrics negative OB ROS                             Anesthesia Physical Anesthesia Plan  ASA: 3  Anesthesia Plan: MAC and Regional   Post-op Pain Management: Regional block* and Tylenol PO (pre-op)*   Induction:   PONV Risk Score and Plan: 2 and Propofol infusion and TIVA  Airway Management Planned: Natural Airway and Simple Face Mask  Additional Equipment: None  Intra-op Plan:   Post-operative Plan:   Informed Consent: I have reviewed the patients History and Physical, chart, labs and discussed the procedure including the risks, benefits and alternatives for the proposed anesthesia with the patient or authorized representative who has indicated his/her understanding and acceptance.       Plan Discussed with: CRNA  Anesthesia Plan Comments:        Anesthesia Quick Evaluation

## 2022-10-18 NOTE — H&P (Signed)
Chelsea Thompson is an 51 y.o. female.   Chief Complaint: Left wrist pain and MRI documented intra-articular pathology despite conservative care HPI: Patient is a very pleasant 51 year old female with MRI documented intra-articular pathology and persistent left wrist first dorsal compartment tenosynovitis despite conservative measures.  Past Medical History:  Diagnosis Date   Allergic rhinitis    Anxiety    Chronic lower back pain    Complication of anesthesia    "it takes away my memory" (06/06/2018)   COVID-19    8/23   GERD (gastroesophageal reflux disease) 04/08/2016   Glucose intolerance (impaired glucose tolerance)    Headache    "maybe monthly" (06/06/2018)   HTN (hypertension)    Hyperlipidemia    Hypothyroidism    DR [name]   Impaired glucose tolerance 10/04/2011   Obesity    OSA on CPAP 10/08/2014   "not using this right now" (06/06/2018)   Pituitary adenoma ([place])    Shortness of breath     Past Surgical History:  Procedure Laterality Date   ANTERIOR CERVICAL DECOMP/DISCECTOMY FUSION  11/18/2012   Procedure: ANTERIOR CERVICAL DECOMPRESSION/DISCECTOMY FUSION 1 LEVEL;  Surgeon: Hosie Spangle, MD;  Location: [facility];  Service: Neurosurgery;  Laterality: N/A;  Cervical five-six Anterior cervical decompression/diskectomy/fusion    BACK SURGERY     CHOLECYSTECTOMY     2020   COLONOSCOPY WITH PROPOFOL N/A 08/27/2018   Procedure: COLONOSCOPY WITH PROPOFOL;  Surgeon: Juanita Craver, MD;  Location: [facility];  Service: Endoscopy;  Laterality: N/A;   DILATION AND CURETTAGE OF UTERUS  07/2010   Hysteroscopy NEG w/Dr Mezer   ESOPHAGOGASTRODUODENOSCOPY (EGD) WITH PROPOFOL N/A 08/27/2018   Procedure: ESOPHAGOGASTRODUODENOSCOPY (EGD) WITH PROPOFOL;  Surgeon: Juanita Craver, MD;  Location: [facility];  Service: Endoscopy;  Laterality: N/A;   IR RADIOLOGIST EVAL & MGMT  09/07/2022   REDUCTION MAMMAPLASTY Bilateral 2001    Family History  Problem Relation Age of Onset    Other Mother 61       MVA    Hypertension Mother    Hypertension Sister    Hypertension Brother    Cancer Brother        Prostate   Social History:  reports that she has never smoked. She has never used smokeless tobacco. She reports that she does not drink alcohol and does not use drugs.  Allergies:  Allergies  Allergen Reactions   Peanut-Containing Drug Products Anaphylaxis   Shellfish Allergy Anaphylaxis    Medications Prior to Admission  Medication Sig Dispense Refill   Acetaminophen Extra Strength 500 MG CAPS Take 1,000 mg by mouth every 8 (eight) hours as needed (pain).     amLODipine (NORVASC) 10 MG tablet Take 1 tablet (10 mg total) by mouth daily. 30 tablet 0   atorvastatin (LIPITOR) 40 MG tablet Take 40 mg by mouth daily.  3   cabergoline (DOSTINEX) 0.5 MG tablet Take 0.5 mg by mouth 2 (two) times a week. Monday, Thursday  5   celecoxib (CELEBREX) 100 MG capsule Take 100 mg by mouth 2 (two) times daily.     diclofenac Sodium (VOLTAREN) 1 % GEL Apply 2 g topically 2 (two) times daily as needed (pain).     EPINEPHrine (EPIPEN 2-PAK) 0.3 mg/0.3 mL IJ SOAJ injection Inject 0.3 mg into the muscle once.     gabapentin (NEURONTIN) 100 MG capsule Take 100 mg by mouth 3 (three) times daily.     levothyroxine (SYNTHROID, LEVOTHROID) 25 MCG tablet TAKE 1 TABLET (25 MCG  TOTAL) BY MOUTH DAILY BEFORE BREAKFAST. (Patient taking differently: Take 25 mcg by mouth daily before breakfast.) 90 tablet 0   Multiple Vitamin (MULTIVITAMIN WITH MINERALS) TABS Take 1 tablet by mouth daily.     nadolol (CORGARD) 80 MG tablet Take 2 tablets (160 mg total) by mouth daily. 60 tablet 0   omeprazole (PRILOSEC) 40 MG capsule Take 40 mg by mouth daily.  3   ondansetron (ZOFRAN-ODT) 4 MG disintegrating tablet Take 4 mg by mouth every 8 (eight) hours as needed for nausea.     spironolactone (ALDACTONE) 25 MG tablet Take 1 tablet (25 mg total) by mouth 2 (two) times daily. (Patient taking differently: Take  25 mg by mouth daily.) 60 tablet 0   Vitamin D, Ergocalciferol, (DRISDOL) 1.25 MG (50000 UNIT) CAPS capsule Take 1 capsule by mouth once a week. Monday     WEGOVY 1.7 MG/0.75ML SOAJ Inject 1.7 mg into the skin once a week. Tuesday      Results for orders placed or performed during the hospital encounter of 10/18/22 (from the past 48 hour(s))  CBC per protocol     Status: None   Collection Time: 10/18/22  7:05 AM  Result Value Ref Range   WBC 8.8 4.0 - 10.5 K/uL   RBC 4.56 3.87 - 5.11 MIL/uL   Hemoglobin 13.0 12.0 - 15.0 g/dL   HCT 40.3 36.0 - 46.0 %   MCV 88.4 80.0 - 100.0 fL   MCH 28.5 26.0 - 34.0 pg   MCHC 32.3 30.0 - 36.0 g/dL   RDW 14.4 11.5 - 15.5 %   Platelets 380 150 - 400 K/uL   nRBC 0.0 0.0 - 0.2 %    Comment: Performed at [facility], [place] [address], [place], [place] [address]   No results found.  Review of Systems  All other systems reviewed and are negative.   Blood pressure (!) 142/86, pulse 70, temperature 98.5 F (36.9 C), temperature source Oral, resp. rate 18, height '5\' 4"'$  (1.626 m), weight (!) 149.2 kg, last menstrual period 09/29/2022, SpO2 98 %. Physical Exam Constitutional:      Appearance: Normal appearance.  HENT:     Head: Normocephalic and atraumatic.  Eyes:     Pupils: Pupils are equal, round, and reactive to light.  Cardiovascular:     Rate and Rhythm: Normal rate.  Pulmonary:     Effort: Pulmonary effort is normal.  Musculoskeletal:     Left wrist: Swelling, tenderness and bony tenderness present. Decreased range of motion.     Cervical back: Normal range of motion.     Comments: Left wrist first dorsal compartment pain and swelling as well as pain over dorsal SL ligament area and ulnar wrist as well  Skin:    General: Skin is warm.  Neurological:     General: No focal deficit present.     Mental Status: She is alert and oriented to person, place, and time.  Psychiatric:        Mood and Affect: Mood normal.        Behavior:  Behavior normal.        Thought Content: Thought content normal.        Judgment: Judgment normal.      Assessment/Plan 51 year old female with MRI documented intra-articular pathology left wrist and left first dorsal compartment tenosynovitis with persistent pain despite conservative measures.  Have discussed the role of arthroscopic examination debridement as necessary left wrist and first dorsal compartment release left  side.  Patient understands risks and benefits the fact that we cannot guarantee complete pain relief and may need further surgery in the future.  She was seen as an outpatient today.  Schuyler Amor, MD 10/18/2022, 7:41 AM

## 2022-10-18 NOTE — Transfer of Care (Signed)
Immediate Anesthesia Transfer of Care Note  Patient: Chelsea Thompson  Procedure(s) Performed: LEFT WRIST ARTHROSCOPY WITH DEBRIDEMENT AS NEEDED (Left: Wrist) RELEASE LEFT FIRST DORSAL COMPARTMENT (Left: Wrist)  Patient Location: PACU  Anesthesia Type:MAC combined with regional for post-op pain  Level of Consciousness: awake, alert , oriented, and patient cooperative  Airway & Oxygen Therapy: Patient Spontanous Breathing and Patient connected to nasal cannula oxygen  Post-op Assessment: Report given to RN and Post -op Vital signs reviewed and stable  Post vital signs: Reviewed and stable  Last Vitals:  Vitals Value Taken Time  BP 141/82 10/18/22 0934  Temp 36.4 C 10/18/22 0934  Pulse 96 10/18/22 0936  Resp 20 10/18/22 0936  SpO2 96 % 10/18/22 0936  Vitals shown include unvalidated device data.  Last Pain:  Vitals:   10/18/22 0934  TempSrc:   PainSc: 4       Patients Stated Pain Goal: 3 (31/51/76 1607)  Complications: No notable events documented.

## 2022-10-18 NOTE — Brief Op Note (Signed)
10/18/2022  9:18 AM  PATIENT:  Chelsea Thompson  51 y.o. female  PRE-OPERATIVE DIAGNOSIS:  left wrist dequervains, left wrist schapholunate ligament and tfc tear  POST-OPERATIVE DIAGNOSIS:  left wrist dequervains, left wrist schapholunate ligament and tfc tear  PROCEDURE:  Procedure(s) with comments: LEFT WRIST ARTHROSCOPY WITH DEBRIDEMENT AS NEEDED (Left) RELEASE LEFT FIRST DORSAL COMPARTMENT (Left) - LEFT WRIST ARTHROSCOPY WITH DEBRIDEMENT AS NEEDED. RELEASE LEFT FIRST DORSAL COMPARTMENT AXILLARY BLOCK/MAC   NEEDS 1 HOUR  SURGEON:  Surgeon(s) and Role:    * Charlotte Crumb, MD - Primary  PHYSICIAN ASSISTANT:   ASSISTANTS: none   ANESTHESIA:   regional  EBL: Minimal  BLOOD ADMINISTERED:none  DRAINS: none   LOCAL MEDICATIONS USED:  NONE  SPECIMEN:  No Specimen  DISPOSITION OF SPECIMEN:  N/A  COUNTS:  YES  TOURNIQUET:   Total Tourniquet Time Documented: Upper Arm (Left) - 27 minutes Total: Upper Arm (Left) - 27 minutes   DICTATION: .Viviann Spare Dictation  PLAN OF CARE: Discharge to home after PACU  PATIENT DISPOSITION:  PACU - hemodynamically stable.   Delay start of Pharmacological VTE agent (>24hrs) due to surgical blood loss or risk of bleeding: not applicable

## 2022-10-18 NOTE — Anesthesia Procedure Notes (Signed)
Anesthesia Regional Block: Supraclavicular block   Pre-Anesthetic Checklist: , timeout performed,  Correct Patient, Correct Site, Correct Laterality,  Correct Procedure, Correct Position, site marked,  Risks and benefits discussed,  Surgical consent,  Pre-op evaluation,  At surgeon's request and post-op pain management  Laterality: Left  Prep: Maximum Sterile Barrier Precautions used, chloraprep       Needles:  Injection technique: Single-shot  Needle Type: Echogenic Stimulator Needle     Needle Length: 9cm  Needle Gauge: 22     Additional Needles:   Procedures:,,,, ultrasound used (permanent image in chart),,    Narrative:  Start time: 10/18/2022 8:05 AM End time: 10/18/2022 8:10 AM Injection made incrementally with aspirations every 5 mL.  Performed by: Personally  Anesthesiologist: Pervis Hocking, DO  Additional Notes: Monitors applied. No increased pain on injection. No increased resistance to injection. Injection made in 5cc increments. Good needle visualization. Patient tolerated procedure well.

## 2022-10-18 NOTE — Anesthesia Postprocedure Evaluation (Signed)
Anesthesia Post Note  Patient: KENSINGTON RIOS  Procedure(s) Performed: LEFT WRIST ARTHROSCOPY WITH DEBRIDEMENT AS NEEDED (Left: Wrist) RELEASE LEFT FIRST DORSAL COMPARTMENT (Left: Wrist)     Patient location during evaluation: PACU Anesthesia Type: Regional and MAC Level of consciousness: awake and alert Pain management: pain level controlled Vital Signs Assessment: post-procedure vital signs reviewed and stable Respiratory status: spontaneous breathing, nonlabored ventilation and respiratory function stable Cardiovascular status: blood pressure returned to baseline and stable Postop Assessment: no apparent nausea or vomiting Anesthetic complications: no   No notable events documented.  Last Vitals:  Vitals:   10/18/22 0949 10/18/22 1004  BP: 137/81 (!) 153/90  Pulse: 94 96  Resp: 15 20  Temp:  (!) 36.4 C  SpO2: 95% 98%    Last Pain:  Vitals:   10/18/22 1004  TempSrc:   PainSc: [place]

## 2022-10-18 NOTE — Op Note (Signed)
Patient was taken the operating suite and after induction of adequate regional anesthetic and IV sedation the left upper extremity was prepped and draped in the usual sterile fashion.  An Esmarch was used to exsanguinate the limb the tourniquet was inflated 2 and 50 mmHg.  This point time the patient's left upper extremity was padded and placed in the wrist traction tower with 15 pounds of countertraction across the radiocarpal joint.  A standard 3 4 arthroscopic portal was established 1 cm distal Lister's tubercle.  The skin was incised and blunt dissection was carried down into the joint.  The scope was introduced and then an 18-gauge needle was used to create an 18-gauge needle outflow portal followed by a 4 5 working portal under direct vision.  Once this was done we inspected the joint revealed a central TFCC tear and slight fraying of the SL ligament.  These were both debrided using both the 3 4 and 4 5 portals using a 2.0 suction shaver.  Once this was completed instruments were then removed from the portals and they were closed with 3-0 Prolene.  We then remove the upper extremity from the traction tower and made an incision over the dorsal radial aspect of the left wrist over the first dorsal compartment.  Dissection was carried down to the first dorsal compartment which was identified and released its dorsal most extent.  There is a separate sheath in the EPB tendon that was lysed.  APL and EPB were lysed of all adhesions.  The wound was irrigated loosely closed with a 3-0 Prolene subcuticular stitch.  Steri-Strips, 4 x 4's, and a radial gutter splint was applied.  The patient tolerated these procedures well went to recovery room in stable fashion.

## 2022-10-19 ENCOUNTER — Encounter (HOSPITAL_COMMUNITY): Payer: Self-pay | Admitting: Orthopedic Surgery

## 2022-11-16 ENCOUNTER — Other Ambulatory Visit: Payer: Self-pay | Admitting: Diagnostic Radiology

## 2022-11-16 DIAGNOSIS — D250 Submucous leiomyoma of uterus: Secondary | ICD-10-CM

## 2022-11-27 ENCOUNTER — Other Ambulatory Visit: Payer: Self-pay | Admitting: Internal Medicine

## 2022-11-29 ENCOUNTER — Ambulatory Visit (HOSPITAL_COMMUNITY): Payer: Commercial Managed Care - HMO

## 2022-12-06 ENCOUNTER — Ambulatory Visit (HOSPITAL_COMMUNITY)
Admission: RE | Admit: 2022-12-06 | Discharge: 2022-12-06 | Disposition: A | Discharge status: Home / Self Care | Payer: BC Managed Care – PPO | Source: Ambulatory Visit | Attending: Diagnostic Radiology | Admitting: Diagnostic Radiology

## 2022-12-06 DIAGNOSIS — D250 Submucous leiomyoma of uterus: Secondary | ICD-10-CM | POA: Diagnosis present

## 2022-12-06 MED ORDER — GADOBUTROL 1 MMOL/ML IV SOLN
10.0000 mL | Freq: Once | INTRAVENOUS | Status: AC | PRN
  Administered 2022-12-06: 10 mL via INTRAVENOUS

## 2022-12-21 ENCOUNTER — Other Ambulatory Visit (HOSPITAL_COMMUNITY): Payer: Self-pay | Admitting: Diagnostic Radiology

## 2022-12-21 DIAGNOSIS — D259 Leiomyoma of uterus, unspecified: Secondary | ICD-10-CM

## 2023-02-05 ENCOUNTER — Other Ambulatory Visit (HOSPITAL_COMMUNITY): Payer: Self-pay | Admitting: Diagnostic Radiology

## 2023-02-05 DIAGNOSIS — D259 Leiomyoma of uterus, unspecified: Secondary | ICD-10-CM

## 2023-02-14 ENCOUNTER — Ambulatory Visit
Admission: RE | Admit: 2023-02-14 | Discharge: 2023-02-14 | Disposition: A | Discharge status: Home / Self Care | Payer: Commercial Managed Care - HMO | Source: Ambulatory Visit | Attending: Diagnostic Radiology | Admitting: Diagnostic Radiology

## 2023-02-14 DIAGNOSIS — D259 Leiomyoma of uterus, unspecified: Secondary | ICD-10-CM

## 2023-02-14 NOTE — Progress Notes (Signed)
Chief Complaint: Patient was consulted remotely today (TeleHealth) for follow-up discussion of uterine artery embolization  Referring Physician(s): Cousins, Sheronette  History of Present Illness: MAEBRIE BORJAS is a 52 y.o. female with symptomatic uterine fibroids.  Pregnancy history is G2, P0.  I saw the patient in consultation on 09/07/2022 and we discussed uterine artery embolization.  Subsequently, the patient had an MRI on 12/06/2022.  MRI demonstrated multiple uterine fibroids, largest measuring 6.9 cm and mild adenomyosis.  Patient underwent left wrist surgery on 10/18/2022 and she says that she is scheduled to have shoulder surgery at some point.  She continues to have significant menorrhagia and dysmenorrhea.  Her bleeding is not significantly changed since her last visit.  However, the patient is now interested in future pregnancy.  She had questions about pregnancy and embolization.  She has not discussed pregnancy with her gynecologist at this point.  Past Medical History:  Diagnosis Date   Allergic rhinitis    Anxiety    Chronic lower back pain    Complication of anesthesia    "it takes away my memory" (06/06/2018)   COVID-19    8/23   GERD (gastroesophageal reflux disease) 04/08/2016   Glucose intolerance (impaired glucose tolerance)    Headache    "maybe monthly" (06/06/2018)   HTN (hypertension)    Hyperlipidemia    Hypothyroidism    DR [name]   Impaired glucose tolerance 10/04/2011   Obesity    OSA on CPAP 10/08/2014   "not using this right now" (06/06/2018)   Pituitary adenoma ([place])    Shortness of breath     Past Surgical History:  Procedure Laterality Date   ANTERIOR CERVICAL DECOMP/DISCECTOMY FUSION  11/18/2012   Procedure: ANTERIOR CERVICAL DECOMPRESSION/DISCECTOMY FUSION 1 LEVEL;  Surgeon: Hosie Spangle, MD;  Location: [place] [facility];  Service: Neurosurgery;  Laterality: N/A;  Cervical five-six Anterior cervical decompression/diskectomy/fusion     BACK SURGERY     CHOLECYSTECTOMY     2020   COLONOSCOPY WITH PROPOFOL N/A 08/27/2018   Procedure: COLONOSCOPY WITH PROPOFOL;  Surgeon: Juanita Craver, MD;  Location: [facility];  Service: Endoscopy;  Laterality: N/A;   DILATION AND CURETTAGE OF UTERUS  07/2010   Hysteroscopy NEG w/Dr Mezer   DORSAL COMPARTMENT RELEASE Left 10/18/2022   Procedure: RELEASE LEFT FIRST DORSAL COMPARTMENT;  Surgeon: Charlotte Crumb, MD;  Location: [place];  Service: Orthopedics;  Laterality: Left;  LEFT WRIST ARTHROSCOPY WITH DEBRIDEMENT AS NEEDED. RELEASE LEFT FIRST DORSAL COMPARTMENT AXILLARY BLOCK/MAC   NEEDS 1 HOUR   ESOPHAGOGASTRODUODENOSCOPY (EGD) WITH PROPOFOL N/A 08/27/2018   Procedure: ESOPHAGOGASTRODUODENOSCOPY (EGD) WITH PROPOFOL;  Surgeon: Juanita Craver, MD;  Location: [facility];  Service: Endoscopy;  Laterality: N/A;   IR RADIOLOGIST EVAL & MGMT  09/07/2022   REDUCTION MAMMAPLASTY Bilateral 2001   WRIST ARTHROSCOPY WITH [facility]POMETACARPEL [facility]) ARTHROPLASTY Left 10/18/2022   Procedure: LEFT WRIST ARTHROSCOPY WITH DEBRIDEMENT AS NEEDED;  Surgeon: Charlotte Crumb, MD;  Location: [place];  Service: Orthopedics;  Laterality: Left;    Allergies: Peanut-containing drug products and Shellfish allergy  Medications: Prior to Admission medications   Medication Sig Start Date End Date Taking? Authorizing Provider  Acetaminophen Extra Strength 500 MG CAPS Take 1,000 mg by mouth every 8 (eight) hours as needed (pain). 09/15/22   [provider]  amLODipine (NORVASC) 10 MG tablet Take 1 tablet (10 mg total) by mouth daily. 06/11/18   Cherene Altes, MD  atorvastatin (LIPITOR) 40 MG tablet Take 40 mg by mouth daily.  08/01/18   [provider]  cabergoline (DOSTINEX) 0.5 MG tablet Take 0.5 mg by mouth 2 (two) times a week. Monday, Thursday 07/09/18   [provider]  celecoxib (CELEBREX) 100 MG capsule Take 100 mg by mouth 2 (two) times daily. 09/19/22   [provider]   diclofenac Sodium (VOLTAREN) 1 % GEL Apply 2 g topically 2 (two) times daily as needed (pain).    [provider]  EPINEPHrine (EPIPEN 2-PAK) 0.3 mg/0.3 mL IJ SOAJ injection Inject 0.3 mg into the muscle once.    [provider]  gabapentin (NEURONTIN) 100 MG capsule Take 100 mg by mouth 3 (three) times daily. 09/15/22   [provider]  levothyroxine (SYNTHROID, LEVOTHROID) 25 MCG tablet TAKE 1 TABLET (25 MCG TOTAL) BY MOUTH DAILY BEFORE BREAKFAST. Patient taking differently: Take 25 mcg by mouth daily before breakfast. 12/10/18   Biagio Borg, MD  Multiple Vitamin (MULTIVITAMIN WITH MINERALS) TABS Take 1 tablet by mouth daily.    [provider]  nadolol (CORGARD) 80 MG tablet Take 2 tablets (160 mg total) by mouth daily. 06/26/18   Marrian Salvage, FNP  omeprazole (PRILOSEC) 40 MG capsule Take 40 mg by mouth daily. 09/03/18   [provider]  ondansetron (ZOFRAN-ODT) 4 MG disintegrating tablet Take 4 mg by mouth every 8 (eight) hours as needed for nausea. 10/09/22   [provider]  spironolactone (ALDACTONE) 25 MG tablet Take 1 tablet (25 mg total) by mouth 2 (two) times daily. Patient taking differently: Take 25 mg by mouth daily. 10/11/18   Marrian Salvage, FNP  traMADol (ULTRAM) 50 MG tablet Take 1 tablet (50 mg total) by mouth every 6 (six) hours as needed. 10/18/22 10/18/23  Charlotte Crumb, MD  Vitamin D, Ergocalciferol, (DRISDOL) 1.25 MG (50000 UNIT) CAPS capsule Take 1 capsule by mouth once a week. Monday 04/11/22   [provider]  WEGOVY 1.7 MG/0.75ML SOAJ Inject 1.7 mg into the skin once a week. Tuesday    [provider]  fexofenadine (ALLEGRA) 180 MG tablet Take 180 mg by mouth daily.    02/27/12  [provider]     Family History  Problem Relation Age of Onset   Other Mother 19       MVA    Hypertension Mother    Hypertension Sister    Hypertension Brother    Cancer Brother         Prostate    Social History   Socioeconomic History   Marital status: Married    Spouse name: Torry   Number of children: 0   Years of education: Masters0   Highest education level: Not on file  Occupational History    Employer: [employer]  Tobacco Use   Smoking status: Never   Smokeless tobacco: Never  Vaping Use   Vaping Use: Never used  Substance and Sexual Activity   Alcohol use: Never    Alcohol/week: 0.0 standard drinks of alcohol   Drug use: Never   Sexual activity: Not on file  Other Topics Concern   Not on file  Social History Narrative   Patient is married Animator) and lives at home with her husband and her mother.   Patient has a Scientist, water quality.   Patient is right-handed.   Patient drinks one cup of soda daily.   Works as Fourth Grade Teacher - [place]  Strain: Not on file  Food Insecurity: Not on file  Transportation Needs: Not on file  Physical Activity: Not on file  Stress: Not on file  Social Connections: Not on file     Review of Systems  Genitourinary:  Positive for menstrual problem and vaginal bleeding.      Physical Exam No direct physical exam was performed   Vital Signs: There were no vitals taken for this visit.  Imaging: CLINICAL DATA:  Symptomatic uterine fibroids.  Treatment planning.   EXAM: MRI PELVIS WITHOUT AND WITH CONTRAST   TECHNIQUE: Multiplanar multisequence MR imaging of the pelvis was performed both before and after administration of intravenous contrast.   CONTRAST:  39m GADAVIST GADOBUTROL 1 MMOL/ML IV SOLN   COMPARISON:  None Available.   FINDINGS: Lower Urinary Tract: No bladder or urethral abnormality identified.   Bowel:  Unremarkable visualized pelvic bowel loops.   Vascular/Lymphatic: No pathologically enlarged lymph nodes or other significant abnormality.   Reproductive:   -- Uterus: Measures 12.1 x 9.4 by 9.2 cm  (volume = 550 cm^3). Diffuse uterine involvement by multiple fibroids is seen. These fibroids are intramural, submucosal, and subserosal in location. The largest fibroid is located in the posterior lower uterine segment, measuring 6.9 cm in maximum diameter. Mild adenomyosis is also seen in the junctional zone in the posterior uterine corpus.   Endometrial thickness measures 5 mm. Normal appearance of cervix. Tampon is noted in the vagina.   -- Intracavitary fibroids:  None.   -- Pedunculated fibroids: None.   -- Fibroid contrast enhancement: All fibroids show contrast enhancement, with several showing internal areas of degeneration   -- Right ovary:  Not visualized, however no adnexal mass identified.   -- Left ovary:  Not visualized, however no adnexal mass identified.   Other: No abnormal free fluid.   Musculoskeletal:  Unremarkable.   IMPRESSION: Diffuse uterine involvement by multiple fibroids, largest measuring 6.9 cm. Mild adenomyosis also noted.   No intracavitary or pedunculated fibroids identified.     Electronically Signed   By: JMarlaine HindM.D.   On: 12/07/2022 11:48  Labs:  CBC: Recent Labs    10/18/22 0705  WBC 8.8  HGB 13.0  HCT 40.3  PLT 380    COAGS: No results for input(s): "INR", "APTT" in the last 8760 hours.  BMP: Recent Labs    10/18/22 0705  NA 135  K 4.0  CL 101  CO2 22  GLUCOSE 126*  BUN 11  CALCIUM 9.6  CREATININE 0.86  GFRNONAA >60     Assessment and Plan:  52year old female with symptomatic uterine fibroids causing menorrhagia and dysmenorrhea.  I reviewed the patient's MRI and there is a dominant fibroid along the posterior aspect of the uterus that measures up to 6.9 cm.  There are additional small uterine fibroids as well.  Based on the MRI findings, the patient is a candidate for uterine artery embolization.  However, the patient had many questions about getting pregnant in the future.  Explained that she has  increased pregnancy risks based on her age but she needs to discuss with Dr. CGarwin Brothers  With regards to embolization and pregnancy, I would not recommend uterine artery embolization if she wants to get pregnant.  She could discuss myomectomy with Dr. CGarwin Brothers  Patient had her questions answered and she will contact [facility] the future if she would like to schedule uterine artery embolization procedure.   Electronically Signed: ABurman Riis3/05/2023, 3:26 PM  I spent a total of    10 Minutes in remote  clinical consultation, greater than 50% of which was counseling/coordinating care for menorrhagia and symptomatic uterine fibroids.  Total time includes review of chart and imaging.  Visit type: Audio only (telephone). Audio (no video) only due to patient preference and technical imitations.. Alternative for in-person consultation at [facility], [address] [place], [place], [place].  Patient ID: Synetta Shadow, female   DOB: January 03, 1971, 52 y.o.   MRN: [id number]

## 2023-03-09 ENCOUNTER — Encounter: Payer: Self-pay | Admitting: *Deleted

## 2023-12-25 DIAGNOSIS — Z23 Encounter for immunization: Secondary | ICD-10-CM | POA: Diagnosis not present

## 2024-01-07 DIAGNOSIS — H35033 Hypertensive retinopathy, bilateral: Secondary | ICD-10-CM | POA: Diagnosis not present

## 2024-03-25 DIAGNOSIS — Z1231 Encounter for screening mammogram for malignant neoplasm of breast: Secondary | ICD-10-CM | POA: Diagnosis not present

## 2024-05-01 DIAGNOSIS — R7301 Impaired fasting glucose: Secondary | ICD-10-CM | POA: Diagnosis not present

## 2024-05-01 DIAGNOSIS — E038 Other specified hypothyroidism: Secondary | ICD-10-CM | POA: Diagnosis not present

## 2024-05-01 DIAGNOSIS — Z13220 Encounter for screening for lipoid disorders: Secondary | ICD-10-CM | POA: Diagnosis not present

## 2024-05-01 DIAGNOSIS — Z79899 Other long term (current) drug therapy: Secondary | ICD-10-CM | POA: Diagnosis not present

## 2024-05-01 DIAGNOSIS — E282 Polycystic ovarian syndrome: Secondary | ICD-10-CM | POA: Diagnosis not present

## 2024-05-06 DIAGNOSIS — Z87892 Personal history of anaphylaxis: Secondary | ICD-10-CM | POA: Diagnosis not present

## 2024-05-06 DIAGNOSIS — R2241 Localized swelling, mass and lump, right lower limb: Secondary | ICD-10-CM | POA: Diagnosis not present

## 2024-05-06 DIAGNOSIS — I10 Essential (primary) hypertension: Secondary | ICD-10-CM | POA: Diagnosis not present

## 2024-05-06 DIAGNOSIS — Z0000 Encounter for general adult medical examination without abnormal findings: Secondary | ICD-10-CM | POA: Diagnosis not present

## 2024-05-06 DIAGNOSIS — L309 Dermatitis, unspecified: Secondary | ICD-10-CM | POA: Diagnosis not present

## 2024-05-22 DIAGNOSIS — E038 Other specified hypothyroidism: Secondary | ICD-10-CM | POA: Diagnosis not present

## 2024-05-22 DIAGNOSIS — E559 Vitamin D deficiency, unspecified: Secondary | ICD-10-CM | POA: Diagnosis not present

## 2024-05-22 DIAGNOSIS — E049 Nontoxic goiter, unspecified: Secondary | ICD-10-CM | POA: Diagnosis not present

## 2024-05-22 DIAGNOSIS — I10 Essential (primary) hypertension: Secondary | ICD-10-CM | POA: Diagnosis not present

## 2024-05-22 DIAGNOSIS — E221 Hyperprolactinemia: Secondary | ICD-10-CM | POA: Diagnosis not present

## 2024-05-22 DIAGNOSIS — E282 Polycystic ovarian syndrome: Secondary | ICD-10-CM | POA: Diagnosis not present

## 2024-05-22 DIAGNOSIS — E7800 Pure hypercholesterolemia, unspecified: Secondary | ICD-10-CM | POA: Diagnosis not present

## 2024-05-22 DIAGNOSIS — R7301 Impaired fasting glucose: Secondary | ICD-10-CM | POA: Diagnosis not present

## 2024-05-23 ENCOUNTER — Other Ambulatory Visit: Payer: Self-pay | Admitting: Endocrinology

## 2024-05-23 DIAGNOSIS — E049 Nontoxic goiter, unspecified: Secondary | ICD-10-CM

## 2024-05-26 ENCOUNTER — Ambulatory Visit
Admission: RE | Admit: 2024-05-26 | Discharge: 2024-05-26 | Disposition: A | Discharge status: Home / Self Care | Source: Ambulatory Visit | Attending: Endocrinology | Admitting: Endocrinology

## 2024-05-26 DIAGNOSIS — E049 Nontoxic goiter, unspecified: Secondary | ICD-10-CM

## 2024-05-26 DIAGNOSIS — E042 Nontoxic multinodular goiter: Secondary | ICD-10-CM | POA: Diagnosis not present

## 2024-06-05 DIAGNOSIS — Z01419 Encounter for gynecological examination (general) (routine) without abnormal findings: Secondary | ICD-10-CM | POA: Diagnosis not present

## 2024-06-06 DIAGNOSIS — R2231 Localized swelling, mass and lump, right upper limb: Secondary | ICD-10-CM | POA: Diagnosis not present

## 2024-06-17 DIAGNOSIS — L2089 Other atopic dermatitis: Secondary | ICD-10-CM | POA: Diagnosis not present

## 2024-07-02 ENCOUNTER — Encounter: Payer: Self-pay | Admitting: Orthopaedic Surgery

## 2024-07-02 ENCOUNTER — Ambulatory Visit (INDEPENDENT_AMBULATORY_CARE_PROVIDER_SITE_OTHER): Payer: Self-pay | Admitting: Orthopaedic Surgery

## 2024-07-02 ENCOUNTER — Other Ambulatory Visit (INDEPENDENT_AMBULATORY_CARE_PROVIDER_SITE_OTHER): Payer: Self-pay

## 2024-07-02 DIAGNOSIS — G8929 Other chronic pain: Secondary | ICD-10-CM

## 2024-07-02 DIAGNOSIS — M25512 Pain in left shoulder: Secondary | ICD-10-CM
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
## 2024-07-02 NOTE — Addendum Note (Signed)
 Addended by: Wilna Pennie on: 07/02/2024 12:21 PM   Modules accepted: Orders

## 2024-08-04 ENCOUNTER — Encounter (HOSPITAL_BASED_OUTPATIENT_CLINIC_OR_DEPARTMENT_OTHER): Payer: Self-pay | Admitting: Cardiology

## 2024-08-04 ENCOUNTER — Ambulatory Visit (INDEPENDENT_AMBULATORY_CARE_PROVIDER_SITE_OTHER): Admitting: Cardiology

## 2024-08-04 VITALS — BP 132/82 | HR 91 | Resp 17 | Ht 64.0 in | Wt 317.0 lb

## 2024-08-04 DIAGNOSIS — Z7189 Other specified counseling: Secondary | ICD-10-CM

## 2024-08-04 DIAGNOSIS — I700 Atherosclerosis of aorta: Secondary | ICD-10-CM | POA: Diagnosis not present

## 2024-08-04 DIAGNOSIS — I10 Essential (primary) hypertension: Secondary | ICD-10-CM | POA: Diagnosis not present

## 2024-08-04 DIAGNOSIS — Z712 Person consulting for explanation of examination or test findings: Secondary | ICD-10-CM

## 2024-08-04 NOTE — Progress Notes (Signed)
 Cardiology Office Note:  .   Date:  08/04/2024  ID:  Suzen DELENA Staff, DOB [birth year], MRN [id number] PCP: Gerome Brunet, DO  [facility] Providers Cardiologist:  Shelda Bruckner, MD {  History of Present Illness: Chelsea Thompson is a 53 y.o. female with PMH hypertension, aortic atherosclerosis, breast arterial calcification  Today: Referred 03/28/24 for arterial calcification. Notes reviewed under the media tab. Referred for calcification seen on mammogram. Results reviewed. No other notes available from referring provider.   Patient is very nervous, isn't sure why she was sent to cardiology.   Cardiovascular risk factors: Prior clinical ASCVD: none that she is aware of.  Comorbid conditions: hypertension--diagnosed after taking phen-fen in the 1990s, hyperlipidemia Metabolic syndrome/Obesity:highest nonpregnant adult weight is current weight (313 lbs at home), ran in the 290s for most of her adulthood. Was at 280 lbs with [facility] but stopped being covered by insurance.  Chronic inflammatory conditions: none Tobacco use history: never Family history: no history of ASCVD Prior pertinent testing and/or incidental findings: reviewed her mammogram results today, which showed bilateral arterial calcifications in the breast. Also had CT in 2023 that showed aortic atherosclerosis. Stress test 2019 without ischemia. Renal artery duplex without significant stenosis in 2019.   She is already on atorvastatin , started during hospitalization in 2019.   Blood pressure had been well controlled for about 15 years, but they stopped making her medications (corzide --nadolol  and bendroflumethiazide ) and had difficult to control blood pressure requiring hospitalization in 2019 for hypertensive urgency. Followed with Dr. Levern for a time. Did well on azilsartan but costs $300 so couldn't continue.  Blood pressure at home ranges somewhat, can be 130s-140s/70s. Currently taking  amlodipine  10 mg daily, nadolol  160 mg daily, spironolactone  25 mg daily.   She is able to do activity, just pushed mowed her yard this last weekend.  Just let go from [place] schools last week as part of downsizing.  ROS: Denies chest pain, shortness of breath at rest or with normal exertion. No PND, orthopnea, LE edema or unexpected weight gain. No syncope or palpitations. ROS otherwise negative except as noted.   Studies Reviewed: SABRA    EKG:  EKG Interpretation Date/Time:  Monday August 04 2024 11:03:39 EDT Ventricular Rate:  73 PR Interval:  180 QRS Duration:  86 QT Interval:  376 QTC Calculation: 414 R Axis:   49  Text Interpretation: Normal sinus rhythm Possible Left atrial enlargement When compared with ECG of 08-Jun-2018 21:30, Non-specific change in ST segment in Inferior leads T wave inversion no longer evident in Inferior leads T wave inversion no longer evident in Lateral leads Confirmed by Bruckner Shelda [phone]) on 08/04/2024 11:31:51 AM    Physical Exam:   VS:  BP 132/82 (Cuff Size: Large)   Pulse 91   Resp 17   Ht 5' 4 (1.626 m)   Wt (!) 317 lb (143.8 kg)   SpO2 97%   BMI 54.41 kg/m    Wt Readings from Last 3 Encounters:  08/04/24 (!) 317 lb (143.8 kg)  10/18/22 (!) 329 lb (149.2 kg)  10/11/18 (!) 312 lb (141.5 kg)    GEN: Well nourished, well developed in no acute distress HEENT: Normal, moist mucous membranes NECK: No JVD CARDIAC: regular rhythm, normal S1 and S2, no rubs or gallops. No murmur. VASCULAR: Radial and DP pulses 2+ bilaterally. No carotid bruits RESPIRATORY:  Clear to auscultation without rales, wheezing or rhonchi  ABDOMEN: Soft, non-tender, non-distended MUSCULOSKELETAL:  Ambulates independently  SKIN: Warm and dry, no edema NEUROLOGIC:  Alert and oriented x 3. No focal neuro deficits noted. PSYCHIATRIC:  Normal affect    ASSESSMENT AND PLAN: .    Breast arterial calcification Aortic atherosclerosis -we discussed that this is an  emerging field. There is some association noted between presence of breast arterial calcification and cardiovascular disease, but there are not currently clear guidelines regarding management of this -in general, we assess overall cardiovascular risk and modifiable risk factors, such as lifestyle -we also discussed role of statin for management of plaque. There is no clear guideline about breast arterial calcification, but with aortic atherosclerosis, recommendation is for statin use, which she is already on -discussed that calcifications do not resolve (permanent/inorganic), so this will continue to show on future testing -she is already on atorvastatin , so she is on preventative medication. Per [facility], last LDL 93, HDL 62, TG 83. Would aim for lifestyle management, discussed today. There are no clear guideline for LDL goal for breast arterial calcification, reasonable to aim for LDL 70. If not at goal at follow up with lifestyle change, will discuss options  Hypertension -continue amlodipine , nadolol , spironolactone   Obesity, BMI 54 -did very well with Wegovy, but then stopped being covered by her insurance. GLP1 is a great choice for her and has ASCVD risk benefit, but unfortunately without coverage, this is prohibitively expensive -she will work on lifestyle changes  CV risk counseling and prevention -recommend heart healthy/Mediterranean diet, with whole grains, fruits, vegetable, fish, lean meats, nuts, and olive oil. Limit salt. -recommend moderate walking, 3-5 times/week for 30-50 minutes each session. Aim for at least 150 minutes/week. Goal should be pace of 3 miles/hours, or walking 1.5 miles in 30 minutes -recommend avoidance of tobacco products. Avoid excess alcohol .  Dispo: 1 year or sooner as needed  Signed, Shelda Bruckner, MD   Shelda Bruckner, MD, PhD, [facility]  [facility]  [facility] [address] [place], [place] [address] [phone]

## 2024-08-04 NOTE — Patient Instructions (Addendum)
 FOLLOW UP IN 1 YEAR WITH DR CHRISTOPHER, CAITLIN W NP, OR MICHELLE S NP   Cardiovascular risk management: -recommend heart healthy/Mediterranean diet, with whole grains, fruits, vegetable, fish, lean meats, nuts, and olive oil. Limit salt. -recommend moderate walking, 3-5 times/week for 30-50 minutes each session. Aim for at least 150 minutes/week. Goal should be pace of 3 miles/hours, or walking 1.5 miles in 30 minutes  Breast arterial calcification Aortic atherosclerosis -we discussed that breast arterial calcification is an emerging field. There is some association noted between presence of breast arterial calcification and cardiovascular disease, but there are not currently clear guidelines regarding management of this -in general, we assess overall cardiovascular risk and modifiable risk factors, such as lifestyle -we also discussed role of statin for management of plaque. There is no clear guideline about breast arterial calcification, but with aortic atherosclerosis, recommendation is for statin use, which she is already on -discussed that calcifications do not resolve (permanent/inorganic), so this will continue to show on future testing

## 2024-09-30 ENCOUNTER — Ambulatory Visit: Admitting: Physician Assistant

## 2024-10-13 ENCOUNTER — Encounter: Payer: Self-pay | Admitting: Radiology
